# Patient Record
Sex: Male | Born: 1955 | Race: White | Hispanic: No | Smoking: Never smoker
Health system: Southern US, Community
[De-identification: ages and names within clinical notes are randomized; demographics above are authoritative.]

## PROBLEM LIST (undated history)

## (undated) DIAGNOSIS — K219 Gastro-esophageal reflux disease without esophagitis: Secondary | ICD-10-CM

## (undated) DIAGNOSIS — I10 Essential (primary) hypertension: Secondary | ICD-10-CM

## (undated) DIAGNOSIS — R51 Headache: Secondary | ICD-10-CM

## (undated) DIAGNOSIS — I2510 Atherosclerotic heart disease of native coronary artery without angina pectoris: Secondary | ICD-10-CM

## (undated) DIAGNOSIS — J069 Acute upper respiratory infection, unspecified: Secondary | ICD-10-CM

## (undated) DIAGNOSIS — T7840XA Allergy, unspecified, initial encounter: Secondary | ICD-10-CM

## (undated) DIAGNOSIS — I4891 Unspecified atrial fibrillation: Secondary | ICD-10-CM

## (undated) DIAGNOSIS — N200 Calculus of kidney: Secondary | ICD-10-CM

## (undated) DIAGNOSIS — E785 Hyperlipidemia, unspecified: Secondary | ICD-10-CM

## (undated) HISTORY — PX: COLONOSCOPY: SHX174

## (undated) HISTORY — PX: INGUINAL HERNIA REPAIR: SUR1180

## (undated) HISTORY — DX: Gastro-esophageal reflux disease without esophagitis: K21.9

## (undated) HISTORY — DX: Headache: R51

## (undated) HISTORY — DX: Acute upper respiratory infection, unspecified: J06.9

## (undated) HISTORY — DX: Allergy, unspecified, initial encounter: T78.40XA

## (undated) HISTORY — DX: Essential (primary) hypertension: I10

## (undated) HISTORY — DX: Hyperlipidemia, unspecified: E78.5

## (undated) HISTORY — PX: TONSILLECTOMY: SUR1361

## (undated) HISTORY — PX: CORONARY ANGIOPLASTY: SHX604

---

## 1998-03-25 ENCOUNTER — Encounter: Admission: RE | Admit: 1998-03-25 | Discharge: 1998-04-03 | Payer: Self-pay | Admitting: *Deleted

## 2002-04-17 ENCOUNTER — Ambulatory Visit: Admission: RE | Admit: 2002-04-17 | Discharge: 2002-04-17 | Payer: Self-pay | Admitting: Family Medicine

## 2002-04-17 ENCOUNTER — Encounter: Payer: Self-pay | Admitting: Family Medicine

## 2003-10-10 ENCOUNTER — Observation Stay (HOSPITAL_COMMUNITY): Admission: RE | Admit: 2003-10-10 | Discharge: 2003-10-11 | Payer: Self-pay | Admitting: General Surgery

## 2004-05-29 ENCOUNTER — Ambulatory Visit: Payer: Self-pay | Admitting: Internal Medicine

## 2004-06-01 ENCOUNTER — Ambulatory Visit (HOSPITAL_COMMUNITY): Admission: RE | Admit: 2004-06-01 | Discharge: 2004-06-01 | Payer: Self-pay | Admitting: Internal Medicine

## 2004-06-09 ENCOUNTER — Ambulatory Visit: Payer: Self-pay

## 2004-06-30 ENCOUNTER — Ambulatory Visit: Payer: Self-pay | Admitting: Internal Medicine

## 2004-07-07 ENCOUNTER — Ambulatory Visit: Payer: Self-pay | Admitting: Cardiovascular Disease

## 2006-03-25 ENCOUNTER — Ambulatory Visit: Payer: Self-pay | Admitting: Internal Medicine

## 2006-12-06 ENCOUNTER — Ambulatory Visit: Payer: Self-pay | Admitting: Internal Medicine

## 2008-09-24 ENCOUNTER — Ambulatory Visit: Payer: Self-pay | Admitting: Internal Medicine

## 2009-01-28 ENCOUNTER — Encounter: Payer: Self-pay | Admitting: Internal Medicine

## 2009-06-18 ENCOUNTER — Ambulatory Visit: Payer: Self-pay | Admitting: Internal Medicine

## 2009-06-18 DIAGNOSIS — M542 Cervicalgia: Secondary | ICD-10-CM | POA: Insufficient documentation

## 2009-06-18 DIAGNOSIS — M545 Low back pain, unspecified: Secondary | ICD-10-CM | POA: Insufficient documentation

## 2009-06-18 DIAGNOSIS — K219 Gastro-esophageal reflux disease without esophagitis: Secondary | ICD-10-CM | POA: Insufficient documentation

## 2009-06-18 DIAGNOSIS — R51 Headache: Secondary | ICD-10-CM

## 2009-06-18 DIAGNOSIS — G44209 Tension-type headache, unspecified, not intractable: Secondary | ICD-10-CM | POA: Insufficient documentation

## 2009-06-24 ENCOUNTER — Ambulatory Visit: Payer: Self-pay | Admitting: Cardiology

## 2009-07-07 ENCOUNTER — Telehealth: Payer: Self-pay | Admitting: Internal Medicine

## 2009-07-14 ENCOUNTER — Ambulatory Visit: Payer: Self-pay | Admitting: Internal Medicine

## 2009-08-25 ENCOUNTER — Ambulatory Visit: Payer: Self-pay | Admitting: Internal Medicine

## 2009-10-03 ENCOUNTER — Ambulatory Visit: Payer: Self-pay | Admitting: Internal Medicine

## 2009-10-03 DIAGNOSIS — N342 Other urethritis: Secondary | ICD-10-CM | POA: Insufficient documentation

## 2009-10-03 LAB — CONVERTED CEMR LAB
Bilirubin Urine: NEGATIVE
Casts: NONE SEEN /lpf
Chlamydia, DNA Probe: NEGATIVE
Crystals: NONE SEEN
GC Probe Amp, Genital: NEGATIVE
HCV Ab: NEGATIVE
Hemoglobin, Urine: NEGATIVE
Hep B C IgM: NEGATIVE
Hep B Core Total Ab: NEGATIVE
Hep B S Ab: NEGATIVE
Hepatitis B Surface Ag: NEGATIVE
Ketones, ur: NEGATIVE mg/dL
Nitrite: NEGATIVE
Protein, ur: NEGATIVE mg/dL
Specific Gravity, Urine: 1.024 (ref 1.005–1.030)
Urine Glucose: NEGATIVE mg/dL
Urobilinogen, UA: 0.2 (ref 0.0–1.0)
pH: 5.5 (ref 5.0–8.0)

## 2010-05-21 ENCOUNTER — Telehealth (INDEPENDENT_AMBULATORY_CARE_PROVIDER_SITE_OTHER): Payer: Self-pay | Admitting: *Deleted

## 2010-05-22 ENCOUNTER — Ambulatory Visit: Payer: Self-pay | Admitting: Internal Medicine

## 2010-05-26 LAB — CONVERTED CEMR LAB
ALT: 33 units/L (ref 0–53)
AST: 26 units/L (ref 0–37)
Albumin: 4 g/dL (ref 3.5–5.2)
Alkaline Phosphatase: 61 units/L (ref 39–117)
BUN: 15 mg/dL (ref 6–23)
Basophils Absolute: 0 10*3/uL (ref 0.0–0.1)
Basophils Relative: 0.7 % (ref 0.0–3.0)
Bilirubin Urine: NEGATIVE
Bilirubin, Direct: 0.1 mg/dL (ref 0.0–0.3)
CO2: 29 meq/L (ref 19–32)
Calcium: 9.1 mg/dL (ref 8.4–10.5)
Chloride: 103 meq/L (ref 96–112)
Cholesterol: 243 mg/dL — ABNORMAL HIGH (ref 0–200)
Creatinine, Ser: 1 mg/dL (ref 0.4–1.5)
Direct LDL: 149.1 mg/dL
Eosinophils Absolute: 0.2 10*3/uL (ref 0.0–0.7)
Eosinophils Relative: 2.5 % (ref 0.0–5.0)
GFR calc non Af Amer: 87.27 mL/min (ref >60)
Glucose, Bld: 91 mg/dL (ref 70–99)
HCT: 46.1 % (ref 39.0–52.0)
HDL: 55.5 mg/dL (ref >39.00)
Hemoglobin, Urine: NEGATIVE
Hemoglobin: 16.3 g/dL (ref 13.0–17.0)
Ketones, ur: NEGATIVE mg/dL
Leukocytes, UA: NEGATIVE
Lymphocytes Relative: 26.8 % (ref 12.0–46.0)
Lymphs Abs: 1.6 10*3/uL (ref 0.7–4.0)
MCHC: 35.3 g/dL (ref 30.0–36.0)
MCV: 91.6 fL (ref 78.0–100.0)
Monocytes Absolute: 0.7 10*3/uL (ref 0.1–1.0)
Monocytes Relative: 11.2 % (ref 3.0–12.0)
Neutro Abs: 3.6 10*3/uL (ref 1.4–7.7)
Neutrophils Relative %: 58.8 % (ref 43.0–77.0)
Nitrite: NEGATIVE
PSA: 0.49 ng/mL (ref 0.10–4.00)
Platelets: 224 10*3/uL (ref 150.0–400.0)
Potassium: 4.6 meq/L (ref 3.5–5.1)
RBC: 5.03 M/uL (ref 4.22–5.81)
RDW: 13.1 % (ref 11.5–14.6)
Sodium: 138 meq/L (ref 135–145)
Specific Gravity, Urine: 1.01 (ref 1.000–1.030)
TSH: 1.28 microintl units/mL (ref 0.35–5.50)
Total Bilirubin: 1.1 mg/dL (ref 0.3–1.2)
Total CHOL/HDL Ratio: 4
Total Protein, Urine: NEGATIVE mg/dL
Total Protein: 6.5 g/dL (ref 6.0–8.3)
Triglycerides: 117 mg/dL (ref 0.0–149.0)
Urine Glucose: NEGATIVE mg/dL
Urobilinogen, UA: 0.2 (ref 0.0–1.0)
VLDL: 23.4 mg/dL (ref 0.0–40.0)
WBC: 6.1 10*3/uL (ref 4.5–10.5)
pH: 7 (ref 5.0–8.0)

## 2010-05-29 ENCOUNTER — Ambulatory Visit: Payer: Self-pay | Admitting: Internal Medicine

## 2010-05-29 ENCOUNTER — Encounter: Payer: Self-pay | Admitting: Internal Medicine

## 2010-06-01 DIAGNOSIS — E785 Hyperlipidemia, unspecified: Secondary | ICD-10-CM | POA: Insufficient documentation

## 2010-06-18 ENCOUNTER — Telehealth: Payer: Self-pay | Admitting: Internal Medicine

## 2010-07-21 NOTE — Letter (Signed)
Summary: Doctor[facility]   Imported By: [place] 10/08/2009 12:14:32  _____________________________________________________________________  External Attachment:    Type:   Image     Comment:   External Document

## 2010-07-21 NOTE — Assessment & Plan Note (Signed)
Summary: NEEDS MEDICATION, WILL NOT DISCUSS WHAT KIND/CD   Vital Signs:  Patient profile:   55 year old male Height:      69 inches Weight:      188.50 pounds BMI:     27.94 O2 Sat:      95 % on Room air Temp:     97.9 degrees F oral Pulse rate:   68 / minute BP sitting:   130 / 80  (left arm) Cuff size:   large  Vitals Entered By: Lucious Groves (October 03, 2009 11:33 AM)  O2 Flow:  Room air CC: Needs Medication: Penlac./kb Is Patient Diabetic? No Pain Assessment Patient in pain? no        CC:  Needs Medication: Penlac./kb.  History of Present Illness: C/o itching x 4 d, some  penile d/c following a month of dating and unprotected sex  Current Medications (verified): 1)  Vitamin D3 1000 Unit  Tabs (Cholecalciferol) .Marland Kitchen.. 1 By Mouth Daily 2)  Ibuprofen 600 Mg Tabs (Ibuprofen) .Marland Kitchen.. 1 By Mouth Bid  Pc X 1 Wk Then As Needed For  Pain 3)  Fioricet 50-325-40 Mg Tabs (Butalbital-Apap-Caffeine) .Marland Kitchen.. 1-2 By Mouth Two Times A Day As Needed Migraine or Tension Ha 4)  Ranitidine Hcl 300 Mg Tabs (Ranitidine Hcl) .Marland Kitchen.. 1 By Mouth Once Daily For Indigestion 5)  Penlac 8 % Soln (Ciclopirox) .... Use Once Daily On Affected Nail(S). Once A Week Remove The Build-Up With An Alcohol Swab 6)  Align  Caps (Probiotic Product) .Marland Kitchen.. 1 By Mouth Once Daily For Your Intesinal Flora Restoraion  Allergies (verified): No Known Drug Allergies  Past History:  Past Medical History: GERD Urethritis 2011  Social History: Reviewed history from 06/18/2009 and no changes required. Divorced Has 1 son  Review of Systems  The patient denies fever, abdominal pain, melena, severe indigestion/heartburn, and enlarged lymph nodes.         No rash  Physical Exam  General:  Well-developed,well-nourished,in no acute distress; alert,appropriate and cooperative throughout examination Mouth:  Oral mucosa and oropharynx without lesions or exudates.  Teeth in good repair. Neck:  Cervical spine is tender to  palpation over paraspinal muscles and with the ROM  Lungs:  Normal respiratory effort, chest expands symmetrically. Lungs are clear to auscultation, no crackles or wheezes. Heart:  Normal rate and regular rhythm. S1 and S2 normal without gallop, murmur, click, rub or other extra sounds. Abdomen:  Bowel sounds positive,abdomen soft and non-tender without masses, organomegaly or hernias noted. Genitalia:  Testes bilaterally descended without nodularity, tenderness or masses. No scrotal masses or lesions. No penis lesions. There is some urethral discharge. Msk:  Lumbar-sacral spine is tender to palpation over paraspinal muscles and painfull with the ROM  Skin:  Intact without suspicious lesions or rashes Psych:  Cognition and judgment appear intact. Alert and cooperative with normal attention span and concentration. No apparent delusions, illusions, hallucinations   Impression & Recommendations:  Problem # 1:  URETHRITIS, ACUTE (ICD-597.80) Assessment New Safe sex discussed The office visit took longer than 25 min with patient councelling for more than 50% of the 20 min   Orders: T-Chlamydia & GC Probe, Genital ([id number]) T-RPR (Syphilis) [phone]) T-Urinalysis [phone]) T-Hepatitis C Antibody ([id number]) T-Hepatitis B Core Antibody, IGM ([id number]) T-Hepatitis B Core Antibody (38101-75102) T-Hepatitis B Surface Antigen (58527-78242) T-Hepatitis B Surface Antibody (35361-44315) T-HIV Antibody  (Reflex) ([id number])  Complete Medication List: 1)  Vitamin D3 1000 Unit Tabs (Cholecalciferol) .Marland Kitchen.. 1 by mouth daily 2)  Ibuprofen  600 Mg Tabs (Ibuprofen) .Marland Kitchen.. 1 by mouth bid  pc x 1 wk then as needed for  pain 3)  Fioricet 50-325-40 Mg Tabs (Butalbital-apap-caffeine) .Marland Kitchen.. 1-2 by mouth two times a day as needed migraine or tension ha 4)  Ranitidine Hcl 300 Mg Tabs (Ranitidine hcl) .Marland Kitchen.. 1 by mouth once daily for indigestion 5)  Penlac 8 % Soln (Ciclopirox) .... Use once daily  on affected nail(s). once a week remove the build-up with an alcohol swab 6)  Align Caps (Probiotic product) .Marland Kitchen.. 1 by mouth once daily for your intesinal flora restoraion 7)  Doxycycline Hyclate 100 Mg Caps (Doxycycline hyclate) .Marland Kitchen.. 1 by mouth two times a day with a glass of water 8)  Ciprofloxacin Hcl 500 Mg Tabs (Ciprofloxacin hcl) .Marland Kitchen.. 1 by mouth bid  Patient Instructions: 1)  Call if you are not better in a reasonable amount of time or if worse.  Prescriptions: CIPROFLOXACIN HCL 500 MG TABS (CIPROFLOXACIN HCL) 1 by mouth bid  #10 x 0   Entered and Authorized by:   Tresa Garter MD   Signed by:   Tresa Garter MD on 10/03/2009   Method used:   Print then Give to Patient   RxID:   [id number] DOXYCYCLINE HYCLATE 100 MG CAPS (DOXYCYCLINE HYCLATE) 1 by mouth two times a day with a glass of water  #20 x 0   Entered and Authorized by:   Tresa Garter MD   Signed by:   Tresa Garter MD on 10/03/2009   Method used:   Print then Give to Patient   RxID:   [id number]

## 2010-07-21 NOTE — Assessment & Plan Note (Signed)
Summary: 6 WK ROV /NWS  #   Vital Signs:  Patient profile:   55 year old male Weight:      187 pounds Temp:     97.8 degrees F oral Pulse rate:   54 / minute BP sitting:   120 / 80  (left arm)  Vitals Entered By: Tora Perches (August 25, 2009 1:45 PM) CC: f/u Is Patient Diabetic? No   CC:  f/u.  History of Present Illness: F/u LBP, neck pain, HAs - overall better.  Preventive Screening-Counseling & Management  Alcohol-Tobacco     Smoking Status: never  Current Medications (verified): 1)  Vitamin D3 1000 Unit  Tabs (Cholecalciferol) .Marland Kitchen.. 1 By Mouth Daily 2)  Ibuprofen 600 Mg Tabs (Ibuprofen) .Marland Kitchen.. 1 By Mouth Bid  Pc X 1 Wk Then As Needed For  Pain 3)  Fioricet 50-325-40 Mg Tabs (Butalbital-Apap-Caffeine) .Marland Kitchen.. 1-2 By Mouth Two Times A Day As Needed Migraine or Tension Ha 4)  Ranitidine Hcl 300 Mg Tabs (Ranitidine Hcl) .Marland Kitchen.. 1 By Mouth Once Daily For Indigestion 5)  Penlac 8 % Soln (Ciclopirox) .... Use Once Daily On Affected Nail(S). Once A Week Remove The Build-Up With An Alcohol Swab 6)  Align  Caps (Probiotic Product) .Marland Kitchen.. 1 By Mouth Once Daily For Your Intesinal Flora Restoraion  Allergies (verified): No Known Drug Allergies  Physical Exam  General:  Well-developed,well-nourished,in no acute distress; alert,appropriate and cooperative throughout examination Mouth:  Oral mucosa and oropharynx without lesions or exudates.  Teeth in good repair. Neck:  Cervical spine is tender to palpation over paraspinal muscles and with the ROM  Abdomen:  Bowel sounds positive,abdomen soft and non-tender without masses, organomegaly or hernias noted. Msk:  Lumbar-sacral spine is tender to palpation over paraspinal muscles and painfull with the ROM  Extremities:  No clubbing, cyanosis, edema, or deformity noted with normal full range of motion of all joints.   Skin:  Intact without suspicious lesions or rashes Psych:  Cognition and judgment appear intact. Alert and cooperative with normal  attention span and concentration. No apparent delusions, illusions, hallucinations   Contraindications/Deferment of Procedures/Staging:    Test/Procedure: FLU VAX    Reason for deferment: patient declined     Test/Procedure: Pneumovax vaccine    Reason for deferment: patient declined   Impression & Recommendations:  Problem # 1:  LOW BACK PAIN, ACUTE (ICD-724.2) Assessment Improved  His updated medication list for this problem includes:    Ibuprofen 600 Mg Tabs (Ibuprofen) .Marland Kitchen... 1 by mouth bid  pc x 1 wk then as needed for  pain    Fioricet 50-325-40 Mg Tabs (Butalbital-apap-caffeine) .Marland Kitchen... 1-2 by mouth two times a day as needed migraine or tension ha  Problem # 2:  HEADACHE (ICD-784.0) Assessment: Improved  His updated medication list for this problem includes:    Ibuprofen 600 Mg Tabs (Ibuprofen) .Marland Kitchen... 1 by mouth bid  pc x 1 wk then as needed for  pain    Fioricet 50-325-40 Mg Tabs (Butalbital-apap-caffeine) .Marland Kitchen... 1-2 by mouth two times a day as needed migraine or tension ha  Problem # 3:  NECK PAIN (ICD-723.1) Assessment: Improved  His updated medication list for this problem includes:    Ibuprofen 600 Mg Tabs (Ibuprofen) .Marland Kitchen... 1 by mouth bid  pc x 1 wk then as needed for  pain    Fioricet 50-325-40 Mg Tabs (Butalbital-apap-caffeine) .Marland Kitchen... 1-2 by mouth two times a day as needed migraine or tension ha  Complete Medication List: 1)  Vitamin D3 1000 Unit Tabs (Cholecalciferol) .Marland Kitchen.. 1 by mouth daily 2)  Ibuprofen 600 Mg Tabs (Ibuprofen) .Marland Kitchen.. 1 by mouth bid  pc x 1 wk then as needed for  pain 3)  Fioricet 50-325-40 Mg Tabs (Butalbital-apap-caffeine) .Marland Kitchen.. 1-2 by mouth two times a day as needed migraine or tension ha 4)  Ranitidine Hcl 300 Mg Tabs (Ranitidine hcl) .Marland Kitchen.. 1 by mouth once daily for indigestion 5)  Penlac 8 % Soln (Ciclopirox) .... Use once daily on affected nail(s). once a week remove the build-up with an alcohol swab 6)  Align Caps (Probiotic product) .Marland Kitchen.. 1 by mouth  once daily for your intesinal flora restoraion 7)  Tamiflu 75 Mg Caps (Oseltamivir phosphate) .Marland Kitchen.. 1 by mouth two times a day for a flu  Other Orders: Tdap => 32yrs IM (27253) Admin 1st Vaccine (66440) Admin 1st Vaccine [facility]) [phone])  Patient Instructions: 1)  Please schedule a follow-up appointment in 3 months. Prescriptions: TAMIFLU 75 MG CAPS (OSELTAMIVIR PHOSPHATE) 1 by mouth two times a day for a flu  #10 x 0   Entered and Authorized by:   Tresa Garter MD   Signed by:   Tresa Garter MD on 08/25/2009   Method used:   Print then Give to Patient   RxID:   [id number]    Tetanus/Td Vaccine    Vaccine Type: Tdap    Site: right deltoid    Mfr: GlaxoSmithKline    Dose: 0.5 ml    Route: IM    Given by: Tora Perches    Exp. Date: 09/13/2011    Lot #: [id number]    VIS given: 05/09/07 version given August 25, 2009.

## 2010-07-21 NOTE — Progress Notes (Signed)
Summary: Letter Request  Phone Note Call from Patient Call back at Home Phone [phone]   Caller: Patient Call For: Jon Garter MD Summary of Call: Pt came into the office requesting for Dr. Posey Rea write a letter concerning  why he need a colonoscopy done, Pt stated that colonoscopy is very costly and he needs the letter to send to his insurance company.  Cell : [phone] Initial call taken by: Livingston Diones,  July 07, 2009 3:09 PM  Follow-up for Phone Call        Colonosc will be scheduled by GI (see orders) - they will put it thru Insurance Follow-up by: Jon Garter MD,  July 08, 2009 7:35 AM

## 2010-07-21 NOTE — Progress Notes (Signed)
  Phone Note Call from Patient   Summary of Call: Mom called .Marland Kitchen He needs labs and OV Initial call taken by: Tresa Garter MD,  May 21, 2010 12:07 PM  Follow-up for Phone Call        Will order CBC, TSH, BMET, Hepatic panel, UA, Lipids, PSA prior Dx: V70.0 Pls call pt or his mom to sch well exam Thank you!   Follow-up by: Tresa Garter MD,  May 21, 2010 12:08 PM  Additional Follow-up for Phone Call Additional follow up Details #1::        Pt called order put in IDX for labs today and CPX sched for 12/9 @3pm . Additional Follow-up by: Verdell Face,  May 22, 2010 8:38 AM
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
## 2010-07-23 NOTE — Progress Notes (Signed)
Summary: MESSAGE TO MD  Phone Note Call from Patient   Summary of Call: Pt's mother left vm in [place], I fwd mess to your VM.  Initial call taken by: Lamar Sprinkles, CMA,  June 18, 2010 9:47 AM  Follow-up for Phone Call        Mom called - abd cramps w/Lipitor Follow-up by: Tresa Garter MD,  June 18, 2010 12:54 PM  Additional Follow-up for Phone Call Additional follow up Details #1::        Stop Lipitor Additional Follow-up by: Tresa Garter MD,  June 18, 2010 12:54 PM   New Allergies: LIPITOR Additional Follow-up for Phone Call Additional follow up Details #2::    left mess to call office back............Marland KitchenLamar Sprinkles, CMA  June 18, 2010 5:24 PM   Additional Follow-up for Phone Call Additional follow up Details #3:: Details for Additional Follow-up Action Taken: pt informed of MD's advisement Additional Follow-up by: Brenton Grills CMA Duncan Dull),  June 19, 2010 8:29 AM  New Allergies: LIPITOR

## 2010-07-23 NOTE — Assessment & Plan Note (Signed)
Summary: CPX/bcbs/#/cd   Vital Signs:  Patient profile:   55 year old male Height:      69 inches Weight:      189 pounds BMI:     28.01 Temp:     98.5 degrees F oral Pulse rate:   80 / minute Pulse rhythm:   regular Resp:     16 per minute BP sitting:   120 / 90  (left arm) Cuff size:   large  Vitals Entered By: Lanier Prude, Beverly Gust) (May 29, 2010 11:52 AM) CC: CPX Is Patient Diabetic? No Comments pt is not taking vit d, ibuprofen, fioricet, ranititdine or align   CC:  CPX.  History of Present Illness: The patient presents for a preventive health examination   Preventive Screening-Counseling & Management  Alcohol-Tobacco     Smoking Status: never  Caffeine-Diet-Exercise     Does Patient Exercise: yes  Current Medications (verified): 1)  Vitamin D3 1000 Unit  Tabs (Cholecalciferol) .Marland Kitchen.. 1 By Mouth Daily 2)  Ibuprofen 600 Mg Tabs (Ibuprofen) .Marland Kitchen.. 1 By Mouth Bid  Pc X 1 Wk Then As Needed For  Pain 3)  Fioricet 50-325-40 Mg Tabs (Butalbital-Apap-Caffeine) .Marland Kitchen.. 1-2 By Mouth Two Times A Day As Needed Migraine or Tension Ha 4)  Ranitidine Hcl 300 Mg Tabs (Ranitidine Hcl) .Marland Kitchen.. 1 By Mouth Once Daily For Indigestion 5)  Align  Caps (Probiotic Product) .Marland Kitchen.. 1 By Mouth Once Daily For Your Intesinal Flora Restoraion 6)  Aspir-Low 81 Mg Tbec (Aspirin) .Marland Kitchen.. 1 By Mouth Once Daily 7)  Fish Oil 1000 Mg Caps (Omega-3 Fatty Acids) .Marland Kitchen.. 1 By Mouth Once Daily  Allergies (verified): No Known Drug Allergies  Past History:  Past Surgical History: Last updated: 07/14/2009 Denies surgical history  Family History: Last updated: 05/29/2010 Family History of Anxiety F CAD, AAA Family History of CAD Male 1st degree relative <60 Family History Hypertension  Past Medical History: GERD Urethritis 2011 Hyperlipidemia  Family History: Family History of Anxiety F CAD, AAA Family History of CAD Male 1st degree relative <60 Family History Hypertension  Social  History: Divorced Has 1 son Never Smoked Alcohol use-yes Regular exercise-yes Occupation: Does Patient Exercise:  yes  Review of Systems       The patient complains of abdominal pain.  The patient denies anorexia, fever, weight loss, weight gain, vision loss, decreased hearing, hoarseness, chest pain, syncope, dyspnea on exertion, peripheral edema, prolonged cough, headaches, hemoptysis, melena, hematochezia, severe indigestion/heartburn, hematuria, incontinence, genital sores, muscle weakness, suspicious skin lesions, transient blindness, difficulty walking, depression, unusual weight change, abnormal bleeding, enlarged lymph nodes, angioedema, and testicular masses.    Physical Exam  General:  Well-developed,well-nourished,in no acute distress; alert,appropriate and cooperative throughout examination Head:  Normocephalic and atraumatic without obvious abnormalities. No apparent alopecia or balding. Eyes:  No corneal or conjunctival inflammation noted. EOMI. Perrla Ears:  External ear exam shows no significant lesions or deformities.  Otoscopic examination reveals clear canals, tympanic membranes are intact bilaterally without bulging, retraction, inflammation or discharge. Hearing is grossly normal bilaterally. Nose:  Erythematous throat mucosa and intranasal erythema.  Neck:  No deformities, masses, or tenderness noted. Lungs:  Normal respiratory effort, chest expands symmetrically. Lungs are clear to auscultation, no crackles or wheezes. Heart:  Normal rate and regular rhythm. S1 and S2 normal without gallop, murmur, click, rub or other extra sounds. Abdomen:  Bowel sounds positive,abdomen soft and non-tender without masses, organomegaly or hernias noted. Genitalia:  Self exam is nl Prostate:  per GI  Msk:  Lumbar-sacral spine is tender to palpation over paraspinal muscles and painfull with the ROM  Pulses:  R and L carotid,radial,femoral,dorsalis pedis and posterior tibial pulses are  full and equal bilaterally Extremities:  No clubbing, cyanosis, edema, or deformity noted with normal full range of motion of all joints.   Neurologic:  No cranial nerve deficits noted. Station and gait are normal. Plantar reflexes are down-going bilaterally. DTRs are symmetrical throughout. Sensory, motor and coordinative functions appear intact. Skin:  Intact without suspicious lesions or rashes Cervical Nodes:  No lymphadenopathy noted Inguinal Nodes:  No significant adenopathy Psych:  Cognition and judgment appear intact. Alert and cooperative with normal attention span and concentration. No apparent delusions, illusions, hallucinations   Impression & Recommendations:  Problem # 1:  Preventive Health Care (ICD-V70.0) Assessment New The labs were reviewed with the patient.  Health and age related issues were discussed. Available screening tests and vaccinations were discussed as well. Healthy life style including good diet and exercise was discussed.  Colonosc. adviced  Problem # 2:  HYPERLIPIDEMIA (ICD-272.4) Assessment: Deteriorated  His updated medication list for this problem includes:    Lipitor 20 Mg Tabs (Atorvastatin calcium) .Marland Kitchen... 1 by mouth once daily for cholesterol  Complete Medication List: 1)  Vitamin D3 1000 Unit Tabs (Cholecalciferol) .Marland Kitchen.. 1 by mouth daily 2)  Ranitidine Hcl 300 Mg Tabs (Ranitidine hcl) .Marland Kitchen.. 1 by mouth once daily for indigestion 3)  Aspir-low 81 Mg Tbec (Aspirin) .Marland Kitchen.. 1 by mouth once daily 4)  Fish Oil 1000 Mg Caps (Omega-3 fatty acids) .Marland Kitchen.. 1 by mouth once daily 5)  Lipitor 20 Mg Tabs (Atorvastatin calcium) .Marland Kitchen.. 1 by mouth once daily for cholesterol  Other Orders: Gastroenterology Referral (GI)  Patient Instructions: 1)  Labs in 2 months  2)  Hepatic Panel prior to visit, ICD-9:272.0 3)  Lipid Panel prior to visit, ICD-9: Prescriptions: LIPITOR 20 MG TABS (ATORVASTATIN CALCIUM) 1 by mouth once daily for cholesterol  #30 x 12   Entered and  Authorized by:   Tresa Garter MD   Signed by:   Tresa Garter MD on 05/29/2010   Method used:   Print then Give to Patient   RxID:   [id number]    Orders Added: 1)  Est. Patient 40-64 years [14782] 2)  Gastroenterology Referral [GI]    Influenza Vaccine (to be given today)

## 2010-08-12 ENCOUNTER — Other Ambulatory Visit: Payer: Self-pay

## 2010-11-06 NOTE — Op Note (Signed)
[name], [name] NO.:  [id number]   MEDICAL RECORD NO.:  [id number]                   PATIENT TYPE:  AMB   LOCATION:  DAY                                  FACILITY:  [facility]   PHYSICIAN:  [name], M.D.            DATE OF BIRTH:  May 13, 1956   DATE OF PROCEDURE:  10/10/2003  DATE OF DISCHARGE:                                 OPERATIVE REPORT   PREOPERATIVE DIAGNOSES:  Bilateral inguinal hernias.   POSTOPERATIVE DIAGNOSES:  Bilateral inguinal hernias.   PROCEDURE:  Laparoscopic bilateral inguinal hernia repair with mesh.   SURGEON:  [name], M.D.   ANESTHESIA:  General.   INDICATIONS FOR PROCEDURE:  This is a 55 year old male who has been  diagnosed with bilateral inguinal hernias. Repair has been suggested to him  by way of the laparoscopic technique. The procedure and the risks were  discussed with him preoperatively.  He has elected to proceed.   TECHNIQUE:  He is seen in the holding area and then brought to the operating  room, placed supine on the operating table and a general anesthetic was  administered.  A Foley catheter was placed in his bladder. The clipper is  used to remove the hair from the lower abdomen and groin and this area was  then sterilely prepped and draped.  Dilute Marcaine solution was infiltrated  in the subumbilical region and transverse subumbilical incision was made  through the skin and subcutaneous tissue.  Using blunt dissection, the  anterior rectus sheath on the right side was identified and a small  longitudinal incision made in it.  The underlying right rectus muscle was  swept laterally exposing the posterior rectus sheath. A balloon dissection  device was then placed into the extraperitoneal plane and under laparoscopic  vision, ballon dissection was performed.   The balloon dissection device was then removed and a trocar was placed in  the extraperitoneal space and CO2 gas at a  pressure of 11 was insufflated.  The laparoscope was introduced and under direct vision, two 5 mm trocars  were placed through lower midline incisions. The symphysis pubis was then  identified using blunt dissection, both of the right and left Cooper's  ligaments were identified. A direct hernia was noted on the right side and  extraperitoneal contents were reduced leaving the sack behind.  Using blunt  dissection, the fibrofatty tissue from the anterior and lateral abdominal  walls was dissected away from the muscular wall to the level of the  umbilicus.  The right spermatic cord was then isolated and no indirect  hernia was noted.   Next, the left side was approached. The direct space was then identified and  no obvious defect was seen. The fibrofatty tissue on the anterior lateral  abdominal wall was taken down bluntly to the level of the umbilicus. The  spermatic cord was identified and indirect hernia was noted here with a  large indirect  sac which was able to be stripped off the spermatic cord and  reduced back to the level of the umbilicus. The cord was then isolated.   A piece of 5 x 6 inch polypropylene mesh with a partial longitudinal slit  cut into it was then placed in the right extraperitoneal space and  positioned appropriately.  It was anchored to Cooper's ligament, the  anterior lateral abdominal wall with spiral tacks.  The two tails were  wrapped around the cord creating a new internal ring. This provided for  adequate coverage of the direct, indirect and femoral spaces.   In a similar fashion, a 5 x 6 inch piece of polypropylene mesh with a  partial longitudinal slit was placed in the left extraperitoneal space and  positioned appropriately.  It was anchored to Cooper's ligament and the  anterior and lateral abdominal wall with spiral tacks. The two tails of the  mesh were wrapped around the cord creating a new internal ring.   The inferolateral aspect of the pieces  of mesh were then held down and the  CO2 gas was released.  Once this was done, the instruments were removed and  the trocar was removed. The right anterior rectus fascial defect was closed  with interrupted #0 Vicryl sutures. The skin incisions were closed with 4-0  Monocryl subcuticular stitches.  Steri-Strips and sterile dressings were  applied.   He tolerated the procedure well without any apparent complications and was  taken to the recovery room in satisfactory condition.                                               [name], M.D.    [name]  D:  10/10/2003  T:  10/10/2003  Job:  [id number]   cc:   [facility] [address]

## 2011-06-02 ENCOUNTER — Other Ambulatory Visit: Payer: Self-pay | Admitting: *Deleted

## 2011-06-02 DIAGNOSIS — Z0000 Encounter for general adult medical examination without abnormal findings: Secondary | ICD-10-CM

## 2011-06-02 DIAGNOSIS — E785 Hyperlipidemia, unspecified: Secondary | ICD-10-CM

## 2011-06-03 ENCOUNTER — Other Ambulatory Visit (INDEPENDENT_AMBULATORY_CARE_PROVIDER_SITE_OTHER): Payer: Self-pay

## 2011-06-03 ENCOUNTER — Other Ambulatory Visit: Payer: Self-pay | Admitting: Internal Medicine

## 2011-06-03 ENCOUNTER — Telehealth: Payer: Self-pay | Admitting: Internal Medicine

## 2011-06-03 DIAGNOSIS — E785 Hyperlipidemia, unspecified: Secondary | ICD-10-CM

## 2011-06-03 LAB — LIPID PANEL
Total CHOL/HDL Ratio: 4
Triglycerides: 99 mg/dL (ref 0.0–149.0)

## 2011-06-03 LAB — LDL CHOLESTEROL, DIRECT: Direct LDL: 140.9 mg/dL

## 2011-06-03 NOTE — Telephone Encounter (Signed)
Please, mail the labs to the patient.     Thx 

## 2011-06-10 NOTE — Telephone Encounter (Signed)
Labs mailed

## 2011-08-24 ENCOUNTER — Telehealth: Payer: Self-pay

## 2011-08-24 NOTE — Telephone Encounter (Signed)
Patient called [facility] requesting a personal call back from his nurse regarding an appt.

## 2011-08-25 ENCOUNTER — Encounter: Payer: Self-pay | Admitting: Gastroenterology

## 2011-08-25 ENCOUNTER — Ambulatory Visit (INDEPENDENT_AMBULATORY_CARE_PROVIDER_SITE_OTHER): Payer: BC Managed Care – PPO | Admitting: Gastroenterology

## 2011-08-25 VITALS — BP 132/76 | HR 60 | Ht 69.0 in | Wt 191.0 lb

## 2011-08-25 DIAGNOSIS — K921 Melena: Secondary | ICD-10-CM

## 2011-08-25 DIAGNOSIS — R195 Other fecal abnormalities: Secondary | ICD-10-CM

## 2011-08-25 MED ORDER — PEG-KCL-NACL-NASULF-NA ASC-C 100 G PO SOLR: 1.0000 | Freq: Once | ORAL | Status: DC

## 2011-08-25 MED ORDER — HYDROCORTISONE ACETATE 25 MG RE SUPP: 25.0000 mg | Freq: Every day | RECTAL | Status: DC

## 2011-08-25 NOTE — Patient Instructions (Signed)
You have been scheduled for a colonoscopy with propofol. Please follow written instructions given to you at your visit today.  Please pick up your prep kit at the pharmacy within the next 1-3 days. We have sent the following medications to your pharmacy for you to pick up at your convenience: Hydrocortisone supppository. cc: Sonda Primes, MD

## 2011-08-25 NOTE — Progress Notes (Signed)
History of Present Illness: This is a 56 year old male noted the onset of bright red rectal bleeding for the past 4 days. He states he had some upper respiratory symptoms and took a [place] antibiotic and Alka-Seltzer for 2-3 days prior to the onset of the rectal bleeding. He discontinued both medications. He has normal bowel movements without pain and then notes a small amount bright red blood in the commode following the bowel movement. Denies weight loss, abdominal pain, constipation, diarrhea, change in stool caliber, melena, nausea, vomiting, dysphagia, reflux symptoms, chest pain.  Review of Systems: Pertinent positive and negative review of systems were noted in the above HPI section. All other review of systems were otherwise negative.  Current Medications, Allergies, Past Medical History, Past Surgical History, Family History and Social History were reviewed in [employer] record.  Physical Exam: General: Well developed , well nourished, no acute distress Head: Normocephalic and atraumatic Eyes:  sclerae anicteric, EOMI Ears: Normal auditory acuity Mouth: No deformity or lesions Neck: Supple, no masses or thyromegaly Lungs: Clear throughout to auscultation Heart: Regular rate and rhythm; no murmurs, rubs or bruits Abdomen: Soft, non tender and non distended. No masses, hepatosplenomegaly or hernias noted. Normal Bowel sounds Rectal: No lesions, no tenderness 3+ Hemoccult positive soft stool in the vault Musculoskeletal: Symmetrical with no gross deformities  Skin: No lesions on visible extremities Pulses:  Normal pulses noted Extremities: No clubbing, cyanosis, edema or deformities noted Neurological: Alert oriented x 4, grossly nonfocal Cervical Nodes:  No significant cervical adenopathy Inguinal Nodes: No significant inguinal adenopathy Psychological:  Alert and cooperative. Normal mood and affect  Assessment and Recommendations:  1. Hematochezia.  Hemoccult-positive stool. Rule out hemorrhoids, colorectal neoplasms. The risks, benefits, and alternatives to colonoscopy with possible biopsy, possible destruction of internal hemorrhoids and possible polypectomy were discussed with the patient and they consent to proceed.

## 2011-08-26 NOTE — Telephone Encounter (Signed)
Pt is scheduled to have Colonoscopy on April 2 with [facility]. He states you mentioned that he may be able to have it for free. Please advise.  Pt is aware AVP is out of town until Monday.

## 2011-08-26 NOTE — Telephone Encounter (Signed)
Called pt- spoke to [place]. She states I need to call back around 11 am.

## 2011-08-30 NOTE — Telephone Encounter (Signed)
I checked - it may only be avail for pts w/o health insurance He has [employer] Thx

## 2011-08-31 ENCOUNTER — Telehealth: Payer: Self-pay | Admitting: Gastroenterology

## 2011-08-31 NOTE — Telephone Encounter (Signed)
Pt informed by Dr. Plotnikov 

## 2011-08-31 NOTE — Telephone Encounter (Signed)
Dr Russella Dar are you doing his colonoscopy as a screening procedure?  The office visit says hematochezia and heme positive stool.  His insurance will pay 100 % if it is coded as a screening.  Please advise

## 2011-09-03 NOTE — Telephone Encounter (Signed)
Patient aware.

## 2011-09-03 NOTE — Telephone Encounter (Signed)
We can code as screening for the procedure visit however the office visit was for rectal bleeding. This might work but cannot guarantee.

## 2011-09-21 ENCOUNTER — Encounter: Payer: Self-pay | Admitting: Gastroenterology

## 2011-09-21 ENCOUNTER — Ambulatory Visit (AMBULATORY_SURGERY_CENTER): Payer: BC Managed Care – PPO | Admitting: Gastroenterology

## 2011-09-21 VITALS — BP 136/90 | HR 73 | Resp 17 | Ht 69.0 in | Wt 191.0 lb

## 2011-09-21 DIAGNOSIS — D126 Benign neoplasm of colon, unspecified: Secondary | ICD-10-CM

## 2011-09-21 DIAGNOSIS — Z1211 Encounter for screening for malignant neoplasm of colon: Secondary | ICD-10-CM

## 2011-09-21 MED ORDER — SODIUM CHLORIDE 0.9 % IV SOLN: 500.0000 mL | INTRAVENOUS | Status: DC

## 2011-09-21 NOTE — Op Note (Signed)
[facility] [address] [place], [place]  [address]  COLONOSCOPY PROCEDURE REPORT  PATIENT:  [name], [name]  MR#:  [id number] BIRTHDATE:  1955-07-04, 55 yrs. old  GENDER:  male ENDOSCOPIST:  Judie Petit T. Russella Dar, MD, [facility] Referred by:  Linda Hedges. Plotnikov, M.D. PROCEDURE DATE:  09/21/2011 PROCEDURE:  Colonoscopy with biopsy ASA CLASS:  Class II INDICATIONS:  1) Routine Risk Screening MEDICATIONS:   MAC sedation, administered by CRNA, propofol (Diprivan) 300 mg IV DESCRIPTION OF PROCEDURE:   After the risks benefits and alternatives of the procedure were thoroughly explained, informed consent was obtained.  Digital rectal exam was performed and revealed no abnormalities.   The LB 180AL [id number] endoscope was introduced through the anus and advanced to the cecum, which was identified by both the appendix and ileocecal valve, without limitations.  The quality of the prep was good, using MoviPrep. The instrument was then slowly withdrawn as the colon was fully examined. <<PROCEDUREIMAGES>> FINDINGS:  A sessile polyp was found at the hepatic flexure. It was 4 mm in size. The polyp was removed using cold biopsy forceps. Mild diverticulosis was found in the sigmoid colon.  Otherwise normal colonoscopy without other polyps, masses, vascular ectasias, or inflammatory changes. Retroflexed views in the rectum revealed internal hemorrhoids, small.  The time to cecum =  1.75 minutes. The scope was then withdrawn (time =  10.5  min) from the patient and the procedure completed.  COMPLICATIONS:  None  ENDOSCOPIC IMPRESSION: 1) 4 mm sessile polyp at the hepatic flexure 2) Mild diverticulosis in the sigmoid colon 3) Internal hemorrhoids  RECOMMENDATIONS: 1) Await pathology results 2) High fiber diet with liberal fluid intake. 3) If the polyp is adenomatous (pre-cancerous), repeat colonoscopy in 5 years. Otherwise follow colorectal cancer screening guidelines for "routine risk"  patients with colonoscopy in 10 years.  Venita Lick. Russella Dar, MD, [facility]  n. eSIGNED:   Venita Lick. Jessaca Philippi at 09/21/2011 02:23 PM  Alverda Skeans, [phone]

## 2011-09-21 NOTE — Progress Notes (Signed)
No complaints noted in the recovery room. Maw  Patient did not experience any of the following events: a burn prior to discharge; a fall within the facility; wrong site/side/patient/procedure/implant event; or a hospital transfer or hospital admission upon discharge from the facility. (G8907) Patient did not have preoperative order for IV antibiotic SSI prophylaxis. (G8918)  

## 2011-09-21 NOTE — Patient Instructions (Signed)
Handout given on polyps, Diverticulsosis, and high fiber diet with liberal fluid intake.  Resume your prior medications today.  Call if any questions or concerns.   YOU HAD AN ENDOSCOPIC PROCEDURE TODAY AT THE [facility]: Refer to the procedure report that was given to you for any specific questions about what was found during the examination.  If the procedure report does not answer your questions, please call your gastroenterologist to clarify.  If you requested that your care partner not be given the details of your procedure findings, then the procedure report has been included in a sealed envelope for you to review at your convenience later.  YOU SHOULD EXPECT: Some feelings of bloating in the abdomen. Passage of more gas than usual.  Walking can help get rid of the air that was put into your GI tract during the procedure and reduce the bloating. If you had a lower endoscopy (such as a colonoscopy or flexible sigmoidoscopy) you may notice spotting of blood in your stool or on the toilet paper. If you underwent a bowel prep for your procedure, then you may not have a normal bowel movement for a few days.  DIET: Your first meal following the procedure should be a light meal and then it is ok to progress to your normal diet.  A half-sandwich or bowl of soup is an example of a good first meal.  Heavy or fried foods are harder to digest and may make you feel nauseous or bloated.  Likewise meals heavy in dairy and vegetables can cause extra gas to form and this can also increase the bloating.  Drink plenty of fluids but you should avoid alcoholic beverages for 24 hours.  ACTIVITY: Your care partner should take you home directly after the procedure.  You should plan to take it easy, moving slowly for the rest of the day.  You can resume normal activity the day after the procedure however you should NOT DRIVE or use heavy machinery for 24 hours (because of the sedation medicines used during the  test).    SYMPTOMS TO REPORT IMMEDIATELY: A gastroenterologist can be reached at any hour.  During normal business hours, 8:30 AM to 5:00 PM Monday through Friday, call [phone].  After hours and on weekends, please call the GI answering service at [phone] who will take a message and have the physician on call contact you.   Following lower endoscopy (colonoscopy or flexible sigmoidoscopy):  Excessive amounts of blood in the stool  Significant tenderness or worsening of abdominal pains  Swelling of the abdomen that is new, acute  Fever of 100F or higher    FOLLOW UP: If any biopsies were taken you will be contacted by phone or by letter within the next 1-3 weeks.  Call your gastroenterologist if you have not heard about the biopsies in 3 weeks.  Our staff will call the home number listed on your records the next business day following your procedure to check on you and address any questions or concerns that you may have at that time regarding the information given to you following your procedure. This is a courtesy call and so if there is no answer at the home number and we have not heard from you through the emergency physician on call, we will assume that you have returned to your regular daily activities without incident.  SIGNATURES/CONFIDENTIALITY: You and/or your care partner have signed paperwork which will be entered into your electronic medical record.  These signatures attest to the fact that that the information above on your After Visit Summary has been reviewed and is understood.  Full responsibility of the confidentiality of this discharge information lies with you and/or your care-partner.  

## 2011-09-22 ENCOUNTER — Telehealth: Payer: Self-pay | Admitting: *Deleted

## 2011-09-22 NOTE — Telephone Encounter (Signed)
  Follow up Call-  Call back number 09/21/2011  Post procedure Call Back phone  # [phone]  Permission to leave phone message Yes     Patient questions:  Do you have a fever, pain , or abdominal swelling? no Pain Score  0 *  Have you tolerated food without any problems? yes  Have you been able to return to your normal activities? yes  Do you have any questions about your discharge instructions: Diet   no Medications  no Follow up visit  no  Do you have questions or concerns about your Care? no  Actions: * If pain score is 4 or above: No action needed, pain <4.

## 2011-09-27 ENCOUNTER — Encounter: Payer: Self-pay | Admitting: Gastroenterology

## 2011-10-21 ENCOUNTER — Telehealth: Payer: Self-pay | Admitting: Gastroenterology

## 2011-10-21 NOTE — Telephone Encounter (Signed)
Results reviewed

## 2012-04-18 ENCOUNTER — Telehealth: Payer: Self-pay | Admitting: Gastroenterology

## 2012-04-18 DIAGNOSIS — R195 Other fecal abnormalities: Secondary | ICD-10-CM

## 2012-04-18 DIAGNOSIS — K921 Melena: Secondary | ICD-10-CM

## 2012-04-18 MED ORDER — HYDROCORTISONE ACETATE 25 MG RE SUPP: 25.0000 mg | Freq: Every day | RECTAL | Status: DC

## 2012-04-18 NOTE — Telephone Encounter (Signed)
Patient is requesting a refill of his anusol.  He is having occasional rectal bleeding.  I have sent him a new rx as requested

## 2012-08-14 ENCOUNTER — Telehealth: Payer: Self-pay | Admitting: Gastroenterology

## 2012-08-14 DIAGNOSIS — R195 Other fecal abnormalities: Secondary | ICD-10-CM

## 2012-08-14 DIAGNOSIS — K921 Melena: Secondary | ICD-10-CM

## 2012-08-14 MED ORDER — HYDROCORTISONE ACETATE 25 MG RE SUPP: 25.0000 mg | Freq: Every day | RECTAL | Status: DC

## 2012-08-14 NOTE — Telephone Encounter (Signed)
Prescription sent to [facility]

## 2013-03-23 ENCOUNTER — Emergency Department (HOSPITAL_COMMUNITY)
Admission: EM | Admit: 2013-03-23 | Discharge: 2013-03-23 | Disposition: A | Discharge status: Home / Self Care | Payer: No Typology Code available for payment source | Attending: Emergency Medicine | Admitting: Emergency Medicine

## 2013-03-23 ENCOUNTER — Emergency Department (HOSPITAL_COMMUNITY): Payer: No Typology Code available for payment source

## 2013-03-23 ENCOUNTER — Telehealth: Payer: Self-pay | Admitting: Internal Medicine

## 2013-03-23 DIAGNOSIS — N210 Calculus in bladder: Secondary | ICD-10-CM | POA: Insufficient documentation

## 2013-03-23 DIAGNOSIS — R109 Unspecified abdominal pain: Secondary | ICD-10-CM

## 2013-03-23 DIAGNOSIS — R3915 Urgency of urination: Secondary | ICD-10-CM | POA: Insufficient documentation

## 2013-03-23 DIAGNOSIS — R112 Nausea with vomiting, unspecified: Secondary | ICD-10-CM | POA: Insufficient documentation

## 2013-03-23 DIAGNOSIS — R350 Frequency of micturition: Secondary | ICD-10-CM | POA: Insufficient documentation

## 2013-03-23 DIAGNOSIS — N200 Calculus of kidney: Secondary | ICD-10-CM | POA: Insufficient documentation

## 2013-03-23 LAB — URINALYSIS, ROUTINE W REFLEX MICROSCOPIC
Glucose, UA: NEGATIVE mg/dL
Leukocytes, UA: NEGATIVE
Protein, ur: NEGATIVE mg/dL
Specific Gravity, Urine: 1.013 (ref 1.005–1.030)
pH: 6 (ref 5.0–8.0)

## 2013-03-23 LAB — URINE MICROSCOPIC-ADD ON

## 2013-03-23 MED ORDER — TAMSULOSIN HCL 0.4 MG PO CAPS: 0.4000 mg | ORAL_CAPSULE | Freq: Every day | ORAL | Status: DC

## 2013-03-23 MED ORDER — OXYCODONE-ACETAMINOPHEN 5-325 MG PO TABS: 1.0000 | ORAL_TABLET | Freq: Four times a day (QID) | ORAL | Status: DC | PRN

## 2013-03-23 MED ORDER — ONDANSETRON HCL 4 MG PO TABS: 4.0000 mg | ORAL_TABLET | Freq: Four times a day (QID) | ORAL | Status: DC

## 2013-03-23 NOTE — ED Notes (Signed)
Pt complaining of pain since 3pm on 10/2. Pt states he called EMS after having a sharp pain that would not go away, then vomited and now pain is reduced to 4 on a scale of 0-10. Per EMS pt states he has been having increased urination.

## 2013-03-23 NOTE — ED Notes (Signed)
Bed: ZO10 Expected date:  Expected time:  Means of arrival:  Comments: EMS/right flank pain-male

## 2013-03-23 NOTE — ED Provider Notes (Signed)
Medical screening examination/treatment/procedure(s) were performed by non-physician practitioner and as supervising physician I was immediately available for consultation/collaboration.   Gwyneth Sprout, MD 03/23/13 [phone]

## 2013-03-23 NOTE — Telephone Encounter (Signed)
Ok to  sch next week Thx

## 2013-03-23 NOTE — ED Provider Notes (Signed)
CSN: [id number]     Arrival date & time 03/23/13  0148 History   First MD Initiated Contact with Patient 03/23/13 0149     Chief Complaint  Patient presents with  . Flank Pain   (Consider location/radiation/quality/duration/timing/severity/associated sxs/prior Treatment) HPI Comments: 57 year old healthy male presents to the emergency department with his mother via EMS complaining of right-sided flank pain beginning around 3:00 PM yesterday. Patient states pain began suddenly while he was driving, described as sharp, radiating around his right flank, rated 10 out of 10. Pain lasted throughout the day, prior to EMS arrival he vomited once causing the pain to decreased to 4/10. Currently pain 2/10. His mother applied an ice pack that area without relief. Admits to increased urinary frequency and urgency. Denies dysuria. Denies fever, chills, abdominal pain, chest pain, shortness of breath.  Patient is a 57 y.o. male presenting with flank pain. The history is provided by the patient and a parent.  Flank Pain Associated symptoms include nausea and vomiting. Pertinent negatives include no abdominal pain, chest pain, chills or fever.    No past medical history on file. No past surgical history on file. No family history on file. History  Substance Use Topics  . Smoking status: Not on file  . Smokeless tobacco: Not on file  . Alcohol Use: Not on file    Review of Systems  Constitutional: Negative for fever, chills and appetite change.  Respiratory: Negative for shortness of breath.   Cardiovascular: Negative for chest pain.  Gastrointestinal: Positive for nausea and vomiting. Negative for abdominal pain.  Genitourinary: Positive for urgency, frequency and flank pain. Negative for dysuria, scrotal swelling and testicular pain.  All other systems reviewed and are negative.    Allergies  Review of patient's allergies indicates not on file.  Home Medications  No current outpatient  prescriptions on file. BP 163/92  Pulse 56  Temp(Src) 98.5 F (36.9 C) (Oral)  Resp 18  SpO2 96% Physical Exam  Nursing note and vitals reviewed. Constitutional: He is oriented to person, place, and time. He appears well-developed and well-nourished. No distress.  HENT:  Head: Normocephalic and atraumatic.  Mouth/Throat: Oropharynx is clear and moist.  Eyes: Conjunctivae are normal.  Neck: Normal range of motion. Neck supple.  Cardiovascular: Normal rate, regular rhythm and normal heart sounds.   Pulmonary/Chest: Effort normal and breath sounds normal.  Abdominal: Soft. Bowel sounds are normal. He exhibits no distension. There is no tenderness. There is no rigidity, no rebound, no guarding, no CVA tenderness, no tenderness at McBurney's point and negative Murphy's sign.  Musculoskeletal: Normal range of motion. He exhibits no edema.  Neurological: He is alert and oriented to person, place, and time.  Skin: Skin is warm and dry. He is not diaphoretic.  Psychiatric: He has a normal mood and affect. His behavior is normal.    ED Course  Procedures (including critical care time) Labs Review Labs Reviewed  URINALYSIS, ROUTINE W REFLEX MICROSCOPIC - Abnormal; Notable for the following:    Hgb urine dipstick SMALL (*)    All other components within normal limits  URINE MICROSCOPIC-ADD ON   Imaging Review Ct Abdomen Pelvis Wo Contrast  03/23/2013   CLINICAL DATA:  Micro hematuria. Right flank pain.  EXAM: CT ABDOMEN AND PELVIS WITHOUT CONTRAST  TECHNIQUE: Multidetector CT imaging of the abdomen and pelvis was performed following the standard protocol without intravenous contrast.  COMPARISON:  None.  FINDINGS: There is a right hydronephrosis and perinephric stranding. No visible renal  or ureteral stones. There is a punctate 1-2 mm calcification in the right side of the bladder, likely recently passed stone. Prostate is mildly prominent. Urinary bladder is otherwise unremarkable.  Liver,  gallbladder, spleen, pancreas, adrenals and left kidney are unremarkable on this unenhanced study. Appendix is visualized and is normal. Bowel grossly unremarkable. No free fluid, free air, or adenopathy. Lung bases are clear. No effusions. Heart is normal size. No acute bony abnormality.  IMPRESSION: Punctate 1-2 mm stone in the right posterior bladder, likely recently passed stone. Mild right hydronephrosis and perinephric stranding.   Electronically Signed   By: Charlett Nose M.D.   On: 03/23/2013 03:39    MDM   1. Kidney stone   2. Bladder stone   3. Flank pain     Patient with sudden onset R flank pain, increased urinary frequency and urgency. He is well appearing in no apparent distress. Pain currently controlled, not nauseated at this time. Possible kidney stone. Will obtain CT abdomen pelvis, urinalysis. 4:03 AM CT scan showing punctate 1-2 mm on the right posterior bladder. No associated urinary tract infection. Discharge home with percocet, zofran, flomax. He will f/u with PCP. Return precautions given. Patient states understanding of treatment care plan and is agreeable.  Trevor Mace, PA-C 03/23/13 [phone]

## 2013-03-23 NOTE — Telephone Encounter (Signed)
Pt has an appt on 03/26/13 with Dr. Macario Golds.

## 2013-03-23 NOTE — Telephone Encounter (Signed)
Pt called request to reestablish with Dr. Macario Golds, last ov was 2005, but pt stated he saw Dr. Macario Golds last year (no record in epic). Pt stated the went to ER last night and now he need a hospital fu. Pt has [employer] for insurance. Please advise.

## 2013-03-26 ENCOUNTER — Other Ambulatory Visit (INDEPENDENT_AMBULATORY_CARE_PROVIDER_SITE_OTHER): Payer: No Typology Code available for payment source

## 2013-03-26 ENCOUNTER — Ambulatory Visit (INDEPENDENT_AMBULATORY_CARE_PROVIDER_SITE_OTHER): Payer: No Typology Code available for payment source | Admitting: Internal Medicine

## 2013-03-26 ENCOUNTER — Encounter: Payer: Self-pay | Admitting: Internal Medicine

## 2013-03-26 VITALS — BP 132/84 | HR 52 | Temp 96.8°F | Resp 12 | Wt 196.0 lb

## 2013-03-26 DIAGNOSIS — T50905A Adverse effect of unspecified drugs, medicaments and biological substances, initial encounter: Secondary | ICD-10-CM | POA: Insufficient documentation

## 2013-03-26 DIAGNOSIS — M545 Low back pain, unspecified: Secondary | ICD-10-CM

## 2013-03-26 DIAGNOSIS — N200 Calculus of kidney: Secondary | ICD-10-CM

## 2013-03-26 DIAGNOSIS — R635 Abnormal weight gain: Secondary | ICD-10-CM

## 2013-03-26 DIAGNOSIS — T887XXA Unspecified adverse effect of drug or medicament, initial encounter: Secondary | ICD-10-CM

## 2013-03-26 DIAGNOSIS — E785 Hyperlipidemia, unspecified: Secondary | ICD-10-CM

## 2013-03-26 LAB — CBC WITH DIFFERENTIAL/PLATELET
Basophils Relative: 1.7 % (ref 0.0–3.0)
Eosinophils Relative: 2 % (ref 0.0–5.0)
Lymphocytes Relative: 23.6 % (ref 12.0–46.0)
MCHC: 34.4 g/dL (ref 30.0–36.0)
Neutrophils Relative %: 61.4 % (ref 43.0–77.0)
Platelets: 219 10*3/uL (ref 150.0–400.0)
RBC: 4.98 Mil/uL (ref 4.22–5.81)
RDW: 12.4 % (ref 11.5–14.6)
WBC: 6.3 10*3/uL (ref 4.5–10.5)

## 2013-03-26 LAB — BASIC METABOLIC PANEL
BUN: 13 mg/dL (ref 6–23)
Calcium: 8.9 mg/dL (ref 8.4–10.5)
Creatinine, Ser: 1 mg/dL (ref 0.4–1.5)
GFR: 81.43 mL/min (ref >60.00)
Glucose, Bld: 97 mg/dL (ref 70–99)
Potassium: 4.6 mEq/L (ref 3.5–5.1)

## 2013-03-26 LAB — HEPATIC FUNCTION PANEL
ALT: 47 U/L (ref 0–53)
AST: 27 U/L (ref 0–37)
Albumin: 3.8 g/dL (ref 3.5–5.2)

## 2013-03-26 LAB — LIPID PANEL: Cholesterol: 184 mg/dL (ref 0–200)

## 2013-03-26 NOTE — Progress Notes (Signed)
Subjective:    Patient ID: [name], male    DOB: [birth year], 57 y.o.   MRN: [id number]  HPI  Not seen in 3 years C/o kidney stone - 03/23/13 - s/p ER visit. No pain now. He had an episode of difficulty breathing from Flomax: apnea like sx's F/u dyslipidemia  BP Readings from Last 3 Encounters:  03/26/13 132/84  03/23/13 132/90  09/21/11 136/90   Wt Readings from Last 3 Encounters:  03/26/13 196 lb (88.905 kg)  09/21/11 191 lb (86.637 kg)  08/25/11 191 lb (86.637 kg)      Review of Systems  Constitutional: Negative for chills, appetite change, fatigue and unexpected weight change.  HENT: Negative for nosebleeds, congestion, sore throat, sneezing, trouble swallowing and neck pain.   Eyes: Negative for itching and visual disturbance.  Respiratory: Negative for cough.   Cardiovascular: Negative for chest pain, palpitations and leg swelling.  Gastrointestinal: Negative for nausea, diarrhea, blood in stool and abdominal distention.  Endocrine: Negative for polydipsia.  Genitourinary: Positive for flank pain. Negative for dysuria, urgency, frequency, hematuria, decreased urine volume, penile pain and testicular pain.  Musculoskeletal: Positive for back pain. Negative for joint swelling and gait problem.  Skin: Negative for rash.  Allergic/Immunologic: Negative for food allergies.  Neurological: Negative for dizziness, tremors, speech difficulty, weakness, light-headedness and headaches.  Psychiatric/Behavioral: Negative for sleep disturbance, dysphoric mood and agitation. The patient is not nervous/anxious.        Objective:   Physical Exam  Constitutional: He is oriented to person, place, and time. He appears well-developed. No distress.  NAD  HENT:  Mouth/Throat: Oropharynx is clear and moist.  Eyes: Conjunctivae are normal. Pupils are equal, round, and reactive to light.  Neck: Normal range of motion. No JVD present. No thyromegaly present.  Cardiovascular:  Normal rate, regular rhythm, normal heart sounds and intact distal pulses.  Exam reveals no gallop and no friction rub.   No murmur heard. Pulmonary/Chest: Effort normal and breath sounds normal. No respiratory distress. He has no wheezes. He has no rales. He exhibits no tenderness.  Abdominal: Soft. Bowel sounds are normal. He exhibits no distension and no mass. There is no tenderness. There is no rebound and no guarding.  Musculoskeletal: Normal range of motion. He exhibits no edema and no tenderness.  Lymphadenopathy:    He has no cervical adenopathy.  Neurological: He is alert and oriented to person, place, and time. He has normal reflexes. No cranial nerve deficit. He exhibits normal muscle tone. He displays a negative Romberg sign. Coordination and gait normal.  No meningeal signs  Skin: Skin is warm and dry. No rash noted.  Psychiatric: He has a normal mood and affect. His behavior is normal. Judgment and thought content normal.   Lab Results  Component Value Date   WBC 6.3 03/26/2013   HGB 15.3 03/26/2013   HCT 44.6 03/26/2013   PLT 219.0 03/26/2013   GLUCOSE 97 03/26/2013   CHOL 184 03/26/2013   TRIG 163.0* 03/26/2013   HDL 51.30 03/26/2013   LDLDIRECT 140.9 06/03/2011   LDLCALC 100* 03/26/2013   ALT 47 03/26/2013   AST 27 03/26/2013   NA 136 03/26/2013   K 4.6 03/26/2013   CL 103 03/26/2013   CREATININE 1.0 03/26/2013   BUN 13 03/26/2013   CO2 29 03/26/2013   TSH 1.72 03/26/2013   PSA 0.49 05/22/2010      03/23/13 IMPRESSION:  Punctate 1-2 mm stone in the right posterior bladder, likely  recently  passed stone. Mild right hydronephrosis and perinephric  stranding.      Assessment & Plan:

## 2013-03-26 NOTE — Assessment & Plan Note (Signed)
Labs

## 2013-03-26 NOTE — Assessment & Plan Note (Signed)
CT IMPRESSION:  Punctate 1-2 mm stone in the right posterior bladder, likely  recently passed stone. Mild right hydronephrosis and perinephric  stranding.   No pain

## 2013-03-26 NOTE — Assessment & Plan Note (Signed)
10/14 R flank pain - renal colic

## 2013-03-26 NOTE — Assessment & Plan Note (Signed)
10/14 dyspnea on Flomax

## 2013-05-21 ENCOUNTER — Ambulatory Visit (INDEPENDENT_AMBULATORY_CARE_PROVIDER_SITE_OTHER): Payer: No Typology Code available for payment source | Admitting: Internal Medicine

## 2013-05-21 ENCOUNTER — Encounter: Payer: Self-pay | Admitting: Internal Medicine

## 2013-05-21 VITALS — BP 140/96 | HR 72 | Temp 98.4°F | Resp 16 | Wt 202.0 lb

## 2013-05-21 DIAGNOSIS — I10 Essential (primary) hypertension: Secondary | ICD-10-CM | POA: Insufficient documentation

## 2013-05-21 DIAGNOSIS — IMO0001 Reserved for inherently not codable concepts without codable children: Secondary | ICD-10-CM

## 2013-05-21 DIAGNOSIS — E785 Hyperlipidemia, unspecified: Secondary | ICD-10-CM

## 2013-05-21 DIAGNOSIS — N320 Bladder-neck obstruction: Secondary | ICD-10-CM

## 2013-05-21 DIAGNOSIS — R030 Elevated blood-pressure reading, without diagnosis of hypertension: Secondary | ICD-10-CM

## 2013-05-21 MED ORDER — TAMSULOSIN HCL 0.4 MG PO CAPS: 0.4000 mg | ORAL_CAPSULE | Freq: Every day | ORAL | Status: DC

## 2013-05-21 NOTE — Assessment & Plan Note (Signed)
BP Readings from Last 3 Encounters:  05/21/13 140/96  03/26/13 132/84  03/23/13 132/90   Wt Readings from Last 3 Encounters:  05/21/13 202 lb (91.627 kg)  03/26/13 196 lb (88.905 kg)  09/21/11 191 lb (86.637 kg)    

## 2013-05-21 NOTE — Progress Notes (Signed)
   Subjective:    Patient ID: [name], male    DOB: [birth year], 57 y.o.   MRN: [id number]  Urinary Frequency  This is a new problem. The current episode started more than 1 year ago. The problem occurs intermittently. The problem has been gradually worsening. The patient is experiencing no pain. There has been no fever. Associated symptoms include frequency, hesitancy and urgency. Pertinent negatives include no flank pain. He has tried nothing for the symptoms. The treatment provided no relief.   Wt Readings from Last 3 Encounters:  05/21/13 202 lb (91.627 kg)  03/26/13 196 lb (88.905 kg)  09/21/11 191 lb (86.637 kg)   BP Readings from Last 3 Encounters:  05/21/13 140/96  03/26/13 132/84  03/23/13 132/90       Review of Systems  Constitutional: Negative for fever, appetite change and fatigue.  Gastrointestinal: Negative for diarrhea and constipation.  Genitourinary: Positive for hesitancy, urgency and frequency. Negative for flank pain, decreased urine volume, discharge and genital sores.  Musculoskeletal: Negative for neck stiffness.  Psychiatric/Behavioral: The patient is not nervous/anxious.        Objective:   Physical Exam  Constitutional: He is oriented to person, place, and time. He appears well-developed. No distress.  NAD  HENT:  Mouth/Throat: Oropharynx is clear and moist.  Eyes: Conjunctivae are normal. Pupils are equal, round, and reactive to light.  Neck: Normal range of motion. No JVD present. No thyromegaly present.  Cardiovascular: Normal rate, regular rhythm, normal heart sounds and intact distal pulses.  Exam reveals no gallop and no friction rub.   No murmur heard. Pulmonary/Chest: Effort normal and breath sounds normal. No respiratory distress. He has no wheezes. He has no rales. He exhibits no tenderness.  Abdominal: Soft. Bowel sounds are normal. He exhibits no distension and no mass. There is no tenderness. There is no rebound and no  guarding.  Musculoskeletal: Normal range of motion. He exhibits no edema and no tenderness.  Lymphadenopathy:    He has no cervical adenopathy.  Neurological: He is alert and oriented to person, place, and time. He has normal reflexes. No cranial nerve deficit. He exhibits normal muscle tone. He displays a negative Romberg sign. Coordination and gait normal.  Skin: Skin is warm and dry. No rash noted.  Psychiatric: He has a normal mood and affect. His behavior is normal. Judgment and thought content normal.    Lab Results  Component Value Date   WBC 6.2 05/22/2013   HGB 15.7 05/22/2013   HCT 44.8 05/22/2013   PLT 234.0 05/22/2013   GLUCOSE 106* 05/22/2013   CHOL 200 05/22/2013   TRIG 153.0* 05/22/2013   HDL 54.50 05/22/2013   LDLDIRECT 140.9 06/03/2011   LDLCALC 115* 05/22/2013   ALT 51 05/22/2013   AST 34 05/22/2013   NA 138 05/22/2013   K 4.6 05/22/2013   CL 108 05/22/2013   CREATININE 1.1 05/22/2013   BUN 14 05/22/2013   CO2 26 05/22/2013   TSH 1.82 05/22/2013   PSA 1.06 05/22/2013         Assessment & Plan:

## 2013-05-21 NOTE — Patient Instructions (Signed)
BP Readings from Last 3 Encounters:  05/21/13 140/96  03/26/13 132/84  03/23/13 132/90   Wt Readings from Last 3 Encounters:  05/21/13 202 lb (91.627 kg)  03/26/13 196 lb (88.905 kg)  09/21/11 191 lb (86.637 kg)

## 2013-05-21 NOTE — Progress Notes (Signed)
Pre visit review using our clinic review tool, if applicable. No additional management support is needed unless otherwise documented below in the visit note. 

## 2013-05-21 NOTE — Assessment & Plan Note (Signed)
Continue with current prescription therapy as reflected on the Med list.  

## 2013-05-21 NOTE — Assessment & Plan Note (Signed)
Labs Try flomax  Urol ref

## 2013-05-22 ENCOUNTER — Other Ambulatory Visit (INDEPENDENT_AMBULATORY_CARE_PROVIDER_SITE_OTHER): Payer: No Typology Code available for payment source

## 2013-05-22 DIAGNOSIS — N320 Bladder-neck obstruction: Secondary | ICD-10-CM

## 2013-05-22 DIAGNOSIS — R030 Elevated blood-pressure reading, without diagnosis of hypertension: Secondary | ICD-10-CM

## 2013-05-22 DIAGNOSIS — IMO0001 Reserved for inherently not codable concepts without codable children: Secondary | ICD-10-CM

## 2013-05-22 DIAGNOSIS — E785 Hyperlipidemia, unspecified: Secondary | ICD-10-CM

## 2013-05-22 LAB — LIPID PANEL
Cholesterol: 200 mg/dL (ref 0–200)
HDL: 54.5 mg/dL (ref >39.00)
LDL Cholesterol: 115 mg/dL — ABNORMAL HIGH (ref 0–99)
Total CHOL/HDL Ratio: 4
Triglycerides: 153 mg/dL — ABNORMAL HIGH (ref 0.0–149.0)
VLDL: 30.6 mg/dL (ref 0.0–40.0)

## 2013-05-22 LAB — CBC WITH DIFFERENTIAL/PLATELET
Basophils Absolute: 0 10*3/uL (ref 0.0–0.1)
Basophils Relative: 0.4 % (ref 0.0–3.0)
Eosinophils Absolute: 0.1 10*3/uL (ref 0.0–0.7)
Eosinophils Relative: 2 % (ref 0.0–5.0)
HCT: 44.8 % (ref 39.0–52.0)
Lymphocytes Relative: 30 % (ref 12.0–46.0)
Lymphs Abs: 1.9 10*3/uL (ref 0.7–4.0)
MCHC: 34.9 g/dL (ref 30.0–36.0)
MCV: 88.2 fl (ref 78.0–100.0)
Monocytes Absolute: 0.8 10*3/uL (ref 0.1–1.0)
Neutro Abs: 3.5 10*3/uL (ref 1.4–7.7)
Platelets: 234 10*3/uL (ref 150.0–400.0)
RBC: 5.08 Mil/uL (ref 4.22–5.81)
RDW: 13.1 % (ref 11.5–14.6)
WBC: 6.2 10*3/uL (ref 4.5–10.5)

## 2013-05-22 LAB — PSA: PSA: 1.06 ng/mL (ref 0.10–4.00)

## 2013-05-22 LAB — TSH: TSH: 1.82 u[IU]/mL (ref 0.35–5.50)

## 2013-05-22 LAB — HEPATIC FUNCTION PANEL
ALT: 51 U/L (ref 0–53)
AST: 34 U/L (ref 0–37)
Bilirubin, Direct: 0.1 mg/dL (ref 0.0–0.3)
Total Bilirubin: 0.8 mg/dL (ref 0.3–1.2)
Total Protein: 6.5 g/dL (ref 6.0–8.3)

## 2013-05-22 LAB — BASIC METABOLIC PANEL
BUN: 14 mg/dL (ref 6–23)
CO2: 26 mEq/L (ref 19–32)
Chloride: 108 mEq/L (ref 96–112)
Potassium: 4.6 mEq/L (ref 3.5–5.1)

## 2013-05-22 LAB — URINALYSIS
Bilirubin Urine: NEGATIVE
Hgb urine dipstick: NEGATIVE
Ketones, ur: NEGATIVE
Specific Gravity, Urine: >=1.03 (ref 1.000–1.030)
Total Protein, Urine: NEGATIVE
Urine Glucose: NEGATIVE
Urobilinogen, UA: 0.2 (ref 0.0–1.0)

## 2013-05-28 ENCOUNTER — Encounter: Payer: Self-pay | Admitting: Internal Medicine

## 2013-05-30 ENCOUNTER — Ambulatory Visit (INDEPENDENT_AMBULATORY_CARE_PROVIDER_SITE_OTHER): Payer: No Typology Code available for payment source | Admitting: Internal Medicine

## 2013-05-30 ENCOUNTER — Encounter: Payer: Self-pay | Admitting: Internal Medicine

## 2013-05-30 VITALS — BP 162/98 | HR 72 | Temp 98.0°F | Resp 16 | Wt 202.0 lb

## 2013-05-30 DIAGNOSIS — R030 Elevated blood-pressure reading, without diagnosis of hypertension: Secondary | ICD-10-CM

## 2013-05-30 DIAGNOSIS — R079 Chest pain, unspecified: Secondary | ICD-10-CM

## 2013-05-30 DIAGNOSIS — IMO0001 Reserved for inherently not codable concepts without codable children: Secondary | ICD-10-CM

## 2013-05-30 DIAGNOSIS — R635 Abnormal weight gain: Secondary | ICD-10-CM

## 2013-05-30 DIAGNOSIS — E785 Hyperlipidemia, unspecified: Secondary | ICD-10-CM

## 2013-05-30 DIAGNOSIS — I10 Essential (primary) hypertension: Secondary | ICD-10-CM

## 2013-05-30 MED ORDER — ASPIRIN 325 MG PO TABS: 325.0000 mg | ORAL_TABLET | Freq: Every day | ORAL | Status: DC

## 2013-05-30 MED ORDER — LOSARTAN POTASSIUM 100 MG PO TABS: 100.0000 mg | ORAL_TABLET | Freq: Every day | ORAL | Status: DC

## 2013-05-30 MED ORDER — VITAMIN D 1000 UNITS PO TABS: 1000.0000 [IU] | ORAL_TABLET | Freq: Every day | ORAL | Status: AC

## 2013-05-30 NOTE — Assessment & Plan Note (Signed)
On Lipitor EKG

## 2013-05-30 NOTE — Assessment & Plan Note (Signed)
Added Losartan NAS diet and wt loss

## 2013-05-30 NOTE — Progress Notes (Signed)
Subjective:    Patient ID: [name], male    DOB: August 28, 1955, 57 y.o.   MRN: [id number]  Urinary Frequency  This is a new problem. The current episode started more than 1 year ago. The problem occurs intermittently. The problem has been gradually worsening. The patient is experiencing no pain. There has been no fever. Associated symptoms include frequency, hesitancy and urgency. Pertinent negatives include no flank pain. He has tried nothing for the symptoms. The treatment provided no relief.  Chest Pain  This is a new problem. The current episode started more than 1 month ago. The onset quality is undetermined. The problem occurs intermittently. The pain is present in the substernal region. The pain is moderate. The quality of the pain is described as dull and heavy. Pertinent negatives include no cough, fever, shortness of breath, syncope or weakness. He has tried nothing for the symptoms.  His past medical history is significant for hyperlipidemia and hypertension.  Pertinent negatives for past medical history include no CAD.  His family medical history is significant for sudden death in family.   Wt Readings from Last 3 Encounters:  05/30/13 202 lb (91.627 kg)  05/21/13 202 lb (91.627 kg)  03/26/13 196 lb (88.905 kg)   BP Readings from Last 3 Encounters:  05/30/13 162/98  05/21/13 140/96  03/26/13 132/84       Review of Systems  Constitutional: Negative for fever, appetite change and fatigue.  Respiratory: Positive for chest tightness. Negative for cough and shortness of breath.   Cardiovascular: Positive for chest pain. Negative for syncope.  Gastrointestinal: Negative for diarrhea and constipation.  Genitourinary: Positive for hesitancy, urgency and frequency. Negative for flank pain, decreased urine volume, discharge and genital sores.  Musculoskeletal: Negative for neck stiffness.  Neurological: Negative for weakness.  Psychiatric/Behavioral: The patient is  not nervous/anxious.        Objective:   Physical Exam  Constitutional: He is oriented to person, place, and time. He appears well-developed. No distress.  NAD  HENT:  Mouth/Throat: Oropharynx is clear and moist.  Eyes: Conjunctivae are normal. Pupils are equal, round, and reactive to light.  Neck: Normal range of motion. No JVD present. No thyromegaly present.  Cardiovascular: Normal rate, regular rhythm, normal heart sounds and intact distal pulses.  Exam reveals no gallop and no friction rub.   No murmur heard. Pulmonary/Chest: Effort normal and breath sounds normal. No respiratory distress. He has no wheezes. He has no rales. He exhibits no tenderness.  Abdominal: Soft. Bowel sounds are normal. He exhibits no distension and no mass. There is no tenderness. There is no rebound and no guarding.  Musculoskeletal: Normal range of motion. He exhibits no edema and no tenderness.  Lymphadenopathy:    He has no cervical adenopathy.  Neurological: He is alert and oriented to person, place, and time. He has normal reflexes. No cranial nerve deficit. He exhibits normal muscle tone. He displays a negative Romberg sign. Coordination and gait normal.  Skin: Skin is warm and dry. No rash noted.  Psychiatric: He has a normal mood and affect. His behavior is normal. Judgment and thought content normal.    Lab Results  Component Value Date   WBC 6.2 05/22/2013   HGB 15.7 05/22/2013   HCT 44.8 05/22/2013   PLT 234.0 05/22/2013   GLUCOSE 106* 05/22/2013   CHOL 200 05/22/2013   TRIG 153.0* 05/22/2013   HDL 54.50 05/22/2013   LDLDIRECT 140.9 06/03/2011   LDLCALC 115* 05/22/2013  ALT 51 05/22/2013   AST 34 05/22/2013   NA 138 05/22/2013   K 4.6 05/22/2013   CL 108 05/22/2013   CREATININE 1.1 05/22/2013   BUN 14 05/22/2013   CO2 26 05/22/2013   TSH 1.82 05/22/2013   PSA 1.06 05/22/2013   EKG OK      Assessment & Plan:

## 2013-05-30 NOTE — Assessment & Plan Note (Signed)
Added Losartan NAS diet and wt loss 

## 2013-05-30 NOTE — Progress Notes (Signed)
Pre visit review using our clinic review tool, if applicable. No additional management support is needed unless otherwise documented below in the visit note. 

## 2013-05-30 NOTE — Assessment & Plan Note (Addendum)
  NAS diet and wt loss adviced

## 2013-05-30 NOTE — Assessment & Plan Note (Signed)
2014 episodic Start ASA CL stress test

## 2013-05-30 NOTE — Patient Instructions (Signed)
Loose weight Low salt diet

## 2013-06-01 ENCOUNTER — Telehealth: Payer: Self-pay | Admitting: Internal Medicine

## 2013-06-01 NOTE — Telephone Encounter (Signed)
Pt request phone call from Dr. Macario Golds or the assistant about the stress test that Dr. Macario Golds want him to do at [facility]. Please call pt

## 2013-06-01 NOTE — Telephone Encounter (Signed)
Called and left a VM.  Test is in the works - not scheduled yet.

## 2013-06-29 ENCOUNTER — Telehealth: Payer: Self-pay

## 2013-06-29 NOTE — Telephone Encounter (Signed)
The patient called and is hoping to get a medication refill.  However, I tired several times asking which medication it was, and could not understand his response.  I apologize for the inconvenience.   Pt Callback numbers -  [phone]

## 2013-06-29 NOTE — Telephone Encounter (Signed)
Left mess for patient to call back.  

## 2013-07-02 ENCOUNTER — Telehealth: Payer: Self-pay | Admitting: *Deleted

## 2013-07-02 NOTE — Telephone Encounter (Signed)
Called pt- number is busy. Will try again later. Duplicate phone notes open re: the same. Closing this note. See phone encounter dated 07/02/13.

## 2013-07-02 NOTE — Telephone Encounter (Signed)
Called pt to clarify what is needed. Number is busy. Will try again later.

## 2013-07-02 NOTE — Telephone Encounter (Signed)
Patient called back at 0952 this morning....could NOT understand half of what he said..not sure what he wants.  Something regarding a medication (possibly floxmax from the best I can understand him).  Please advise.    CB# [phone]

## 2013-07-02 NOTE — Telephone Encounter (Signed)
Patient has called at [phone] and at 1454 today wanting his medication refilled....still am not able to understand what he is requesting.  CB# [phone]

## 2013-07-03 ENCOUNTER — Other Ambulatory Visit: Payer: Self-pay | Admitting: Internal Medicine

## 2013-07-03 MED ORDER — DOXAZOSIN MESYLATE 2 MG PO TABS: 2.0000 mg | ORAL_TABLET | Freq: Every day | ORAL | Status: DC

## 2013-07-03 NOTE — Telephone Encounter (Signed)
Pt here with his mother for OV today--he states he needs Rf on gen Flomax. This was addressed by PCP today. Closing phone note.

## 2013-07-03 NOTE — Telephone Encounter (Signed)
Number busy again. Will try again later.

## 2013-07-05 ENCOUNTER — Encounter: Payer: Self-pay | Admitting: Cardiovascular Disease

## 2013-07-09 ENCOUNTER — Encounter (HOSPITAL_COMMUNITY): Payer: No Typology Code available for payment source

## 2013-07-16 ENCOUNTER — Telehealth: Payer: Self-pay | Admitting: Internal Medicine

## 2013-07-16 ENCOUNTER — Telehealth: Payer: Self-pay

## 2013-07-16 NOTE — Telephone Encounter (Signed)
The patient has concerns about his stress test being scheduled.  He states the company informed him he can not have the test done on 2/4 due to it not being authorized.

## 2013-07-16 NOTE — Telephone Encounter (Signed)
Pt scheduled for 07-25-2013@9 :00 for his stress test insurance [facility] is not approving procedure faxed over clinicals on 07-06-2013 still requesting Dr to do a peer to peer by calling  [phone] pt ID# [id number] group # [id number] . i informed pt of this . Please advise the patient still not approve for this procedure

## 2013-07-16 NOTE — Telephone Encounter (Signed)
Hi [name] I called [employer] and they authorized the test --- auth # [id number] exp 3.12.15 --pls sch the test. Thx

## 2013-07-25 ENCOUNTER — Encounter: Payer: Self-pay | Admitting: Physician Assistant

## 2013-07-25 ENCOUNTER — Encounter: Payer: Self-pay | Admitting: Cardiology

## 2013-07-25 ENCOUNTER — Ambulatory Visit (HOSPITAL_COMMUNITY): Payer: 59 | Attending: Internal Medicine | Admitting: Radiology

## 2013-07-25 VITALS — BP 124/91 | Ht 69.0 in | Wt 198.0 lb

## 2013-07-25 DIAGNOSIS — I10 Essential (primary) hypertension: Secondary | ICD-10-CM | POA: Insufficient documentation

## 2013-07-25 DIAGNOSIS — Z8249 Family history of ischemic heart disease and other diseases of the circulatory system: Secondary | ICD-10-CM | POA: Insufficient documentation

## 2013-07-25 DIAGNOSIS — R0602 Shortness of breath: Secondary | ICD-10-CM

## 2013-07-25 DIAGNOSIS — R079 Chest pain, unspecified: Secondary | ICD-10-CM | POA: Insufficient documentation

## 2013-07-25 DIAGNOSIS — E785 Hyperlipidemia, unspecified: Secondary | ICD-10-CM | POA: Insufficient documentation

## 2013-07-25 MED ORDER — TECHNETIUM TC 99M SESTAMIBI GENERIC - CARDIOLITE
10.0000 | Freq: Once | INTRAVENOUS | Status: AC | PRN
  Administered 2013-07-25: 10 via INTRAVENOUS

## 2013-07-25 MED ORDER — TECHNETIUM TC 99M SESTAMIBI GENERIC - CARDIOLITE
30.0000 | Freq: Once | INTRAVENOUS | Status: AC | PRN
  Administered 2013-07-25: 30 via INTRAVENOUS

## 2013-07-25 NOTE — Progress Notes (Signed)
[facility] [address] [place], [place] [address] [phone]    Cardiology Nuclear Med Study  [name] is a 58 y.o. male     MRN : [id number]     DOB: 1956/06/13  Procedure Date: 07/25/2013  Nuclear Med Background Indication for Stress Test:  Evaluation for Ischemia History:NoKnown History of CAD;Previous Nuclear Study 06/09/04,Nml EF:62% Cardiac Risk Factors: Family History - CAD, Hypertension and Lipids  Symptoms:  Chest Pain   Nuclear Pre-Procedure Caffeine/Decaff Intake:  None > 12 hrs NPO After: 9:00pm   Lungs:  clear O2 Sat: 94% on room air. IV 0.9% NS with Angio Cath:  22g  IV Site: R Antecubital x 1, tolerated well IV Started by:  Irven Baltimore, RN  Chest Size (in):  40 Cup Size: n/a  Height: 5\' 9"  (1.753 m)  Weight:  198 lb (89.812 kg)  BMI:  Body mass index is 29.23 kg/(m^2). Tech Comments:  Patient held Losartan x 24 hrs    Nuclear Med Study 1 or 2 day study: 1 day  Stress Test Type:  Stress  Reading MD: N/A  Order Authorizing Provider:  Walker Kehr, MD  Resting Radionuclide: Technetium 44m Sestamibi  Resting Radionuclide Dose: 11.0 mCi   Stress Radionuclide:  Technetium 54m Sestamibi  Stress Radionuclide Dose: 33.0 mCi           Stress Protocol Rest HR: 53 Stress HR: 146  Rest BP: 124/91 Stress BP: 185/86  Exercise Time (min): 11:01 METS: 13.30   Predicted Max HR: 165 bpm % Max HR: 88.48 bpm Rate Pressure Product: 27010   Dose of Adenosine (mg):  n/a Dose of Lexiscan: n/a mg  Dose of Atropine (mg): n/a Dose of Dobutamine: n/a mcg/kg/min (at max HR)  Stress Test Technologist: Ileene Hutchinson, EMT-P  Nuclear Technologist:  Vedia Pereyra, CNMT     Rest Procedure:  Myocardial perfusion imaging was performed at rest 45 minutes following the intravenous administration of Technetium 54m Sestamibi. Rest ECG: Sinus bradycardia with no ST changes.  Stress Procedure:  The patient exercised on the treadmill  utilizing the Bruce Protocol for 11:01 minutes. The patient stopped due to SOB and denied any chest pain.  Technetium 41m Sestamibi was injected at peak exercise and myocardial perfusion imaging was performed after a brief delay. Stress ECG: No significant ST segment change suggestive of ischemia.  QPS Raw Data Images:  Acquisition technically good; normal left ventricular size. Stress Images:  Normal homogeneous uptake in all areas of the myocardium. Rest Images:  Normal homogeneous uptake in all areas of the myocardium. Subtraction (SDS):  No evidence of ischemia. Transient Ischemic Dilatation (Normal <1.22):  0.90 Lung/Heart Ratio (Normal <0.45):  0.32  Quantitative Gated Spect Images QGS EDV:  107 ml QGS ESV:  50 ml  Impression Exercise Capacity:  Good exercise capacity. BP Response:  Normal blood pressure response. Clinical Symptoms:  There is dyspnea. ECG Impression:  No significant ST segment change suggestive of ischemia. Comparison with Prior Nuclear Study: No images to compare  Overall Impression:  Normal stress nuclear study.  LV Ejection Fraction: 53%.  LV Wall Motion:  NL LV Function; NL Wall Motion  [name]

## 2013-07-31 ENCOUNTER — Telehealth: Payer: Self-pay | Admitting: Internal Medicine

## 2013-07-31 NOTE — Telephone Encounter (Signed)
[name], please, inform patient that his heart stress test was normal - good news! Thx

## 2013-08-01 NOTE — Telephone Encounter (Signed)
Left detailed mess informing pt of below.  

## 2013-09-07 ENCOUNTER — Ambulatory Visit (INDEPENDENT_AMBULATORY_CARE_PROVIDER_SITE_OTHER): Payer: 59 | Admitting: Internal Medicine

## 2013-09-07 ENCOUNTER — Encounter: Payer: Self-pay | Admitting: Internal Medicine

## 2013-09-07 ENCOUNTER — Ambulatory Visit (INDEPENDENT_AMBULATORY_CARE_PROVIDER_SITE_OTHER)
Admission: RE | Admit: 2013-09-07 | Discharge: 2013-09-07 | Disposition: A | Discharge status: Home / Self Care | Payer: 59 | Source: Ambulatory Visit | Attending: Internal Medicine | Admitting: Internal Medicine

## 2013-09-07 VITALS — BP 134/80 | HR 88 | Temp 97.8°F | Resp 16 | Ht 69.0 in | Wt 208.8 lb

## 2013-09-07 DIAGNOSIS — R059 Cough, unspecified: Secondary | ICD-10-CM

## 2013-09-07 DIAGNOSIS — R05 Cough: Secondary | ICD-10-CM

## 2013-09-07 DIAGNOSIS — J069 Acute upper respiratory infection, unspecified: Secondary | ICD-10-CM

## 2013-09-07 MED ORDER — HYDROCODONE-HOMATROPINE 5-1.5 MG/5ML PO SYRP: 5.0000 mL | ORAL_SOLUTION | Freq: Three times a day (TID) | ORAL | Status: DC | PRN

## 2013-09-07 NOTE — Progress Notes (Signed)
   Subjective:    Patient ID: [name], male    DOB: 13-Feb-1956, 58 y.o.   MRN: [id number]  Cough This is a new problem. The current episode started yesterday. The problem has been unchanged. The cough is non-productive. Associated symptoms include a sore throat. Pertinent negatives include no chest pain, chills, ear congestion, ear pain, fever, headaches, heartburn, hemoptysis, myalgias, nasal congestion, postnasal drip, rash, rhinorrhea, shortness of breath, sweats, weight loss or wheezing. Nothing aggravates the symptoms. He has tried OTC cough suppressant for the symptoms. The treatment provided mild relief. There is no history of asthma, bronchiectasis, bronchitis, COPD, emphysema, environmental allergies or pneumonia.      Review of Systems  Constitutional: Negative.  Negative for fever, chills, weight loss, diaphoresis, appetite change and fatigue.  HENT: Positive for sore throat. Negative for congestion, ear pain, facial swelling, postnasal drip, rhinorrhea, sinus pressure, sneezing, trouble swallowing and voice change.   Eyes: Negative.   Respiratory: Positive for cough. Negative for apnea, hemoptysis, choking, chest tightness, shortness of breath, wheezing and stridor.   Cardiovascular: Negative.  Negative for chest pain, palpitations and leg swelling.  Gastrointestinal: Negative.  Negative for heartburn, nausea, vomiting, abdominal pain, diarrhea, constipation and blood in stool.  Endocrine: Negative.   Genitourinary: Negative.   Musculoskeletal: Negative.  Negative for arthralgias and myalgias.  Skin: Negative.  Negative for rash.  Allergic/Immunologic: Negative.  Negative for environmental allergies.  Neurological: Negative.  Negative for dizziness and headaches.  Hematological: Negative.  Negative for adenopathy. Does not bruise/bleed easily.  Psychiatric/Behavioral: Negative.        Objective:   Physical Exam  Vitals reviewed. Constitutional: He is oriented  to person, place, and time. He appears well-developed and well-nourished.  Non-toxic appearance. He does not have a sickly appearance. He does not appear ill. No distress.  HENT:  Head: Normocephalic and atraumatic.  Mouth/Throat: Oropharynx is clear and moist. No oropharyngeal exudate.  Eyes: Conjunctivae are normal. Right eye exhibits no discharge. Left eye exhibits no discharge. No scleral icterus.  Neck: Normal range of motion. Neck supple. No JVD present. No tracheal deviation present. No thyromegaly present.  Cardiovascular: Normal rate, regular rhythm, normal heart sounds and intact distal pulses.  Exam reveals no gallop and no friction rub.   No murmur heard. Pulmonary/Chest: Effort normal and breath sounds normal. No accessory muscle usage or stridor. Not tachypneic. No respiratory distress. He has no decreased breath sounds. He has no wheezes. He has no rhonchi. He has no rales. He exhibits no tenderness.  Abdominal: Soft. Bowel sounds are normal. He exhibits no distension and no mass. There is no tenderness. There is no rebound and no guarding.  Musculoskeletal: Normal range of motion. He exhibits no edema and no tenderness.  Lymphadenopathy:    He has no cervical adenopathy.  Neurological: He is oriented to person, place, and time.  Skin: Skin is warm and dry. No rash noted. He is not diaphoretic. No erythema. No pallor.  Psychiatric: He has a normal mood and affect. His behavior is normal. Judgment and thought content normal.          Assessment & Plan:

## 2013-09-07 NOTE — Assessment & Plan Note (Signed)
I will check his CXR to see if there is any lung pathology to explain his cough For now, he appears to have a viral URI so will treat with hycodan

## 2013-09-07 NOTE — Patient Instructions (Signed)

## 2013-09-07 NOTE — Assessment & Plan Note (Signed)
This is viral so antibiotics were not prescribed He will try hycodan for the cough

## 2013-09-07 NOTE — Progress Notes (Signed)
Pre visit review using our clinic review tool, if applicable. No additional management support is needed unless otherwise documented below in the visit note. 

## 2013-09-10 ENCOUNTER — Encounter: Payer: Self-pay | Admitting: Internal Medicine

## 2013-09-10 ENCOUNTER — Ambulatory Visit (INDEPENDENT_AMBULATORY_CARE_PROVIDER_SITE_OTHER): Payer: 59 | Admitting: Internal Medicine

## 2013-09-10 VITALS — BP 140/90 | HR 80 | Temp 98.5°F | Resp 16 | Wt 204.0 lb

## 2013-09-10 DIAGNOSIS — R059 Cough, unspecified: Secondary | ICD-10-CM

## 2013-09-10 DIAGNOSIS — G5713 Meralgia paresthetica, bilateral lower limbs: Secondary | ICD-10-CM | POA: Insufficient documentation

## 2013-09-10 DIAGNOSIS — R05 Cough: Secondary | ICD-10-CM

## 2013-09-10 DIAGNOSIS — J069 Acute upper respiratory infection, unspecified: Secondary | ICD-10-CM

## 2013-09-10 DIAGNOSIS — G5710 Meralgia paresthetica, unspecified lower limb: Secondary | ICD-10-CM

## 2013-09-10 MED ORDER — AZITHROMYCIN 250 MG PO TABS: ORAL_TABLET | ORAL | Status: DC

## 2013-09-10 NOTE — Assessment & Plan Note (Signed)
Worse Added Zpac

## 2013-09-10 NOTE — Patient Instructions (Signed)
Use over-the-counter  "cold" medicines  such as  "Afrin" nasal spray for nasal congestion as directed instead. Use" Delsym" or" Robitussin" cough syrup varietis for cough.  You can use plain "Tylenol" or "Advil" for fever, chills and achyness.  Please, make an appointment if you are not better or if you're worse.  

## 2013-09-10 NOTE — Progress Notes (Signed)
Pre visit review using our clinic review tool, if applicable. No additional management support is needed unless otherwise documented below in the visit note. 

## 2013-09-10 NOTE — Assessment & Plan Note (Signed)
Start Zpac 

## 2013-09-10 NOTE — Progress Notes (Signed)
   Subjective:    Patient ID: [name], male    DOB: December 26, 1955, 58 y.o.   MRN: [id number]  URI  This is a new problem. The current episode started in the past 7 days. The problem has been rapidly worsening. There has been no fever. Associated symptoms include coughing, rhinorrhea, sinus pain and swollen glands. Pertinent negatives include no chest pain or wheezing. He has tried antihistamine, sleep, steam and acetaminophen for the symptoms. The treatment provided no relief.    C/o L thigh and numbness x long time - worse over past 2 d  Review of Systems  Constitutional: Positive for chills.  HENT: Positive for rhinorrhea.   Respiratory: Positive for cough. Negative for wheezing.   Cardiovascular: Negative for chest pain.       Objective:   Physical Exam  Constitutional: He is oriented to person, place, and time. He appears well-developed. No distress.  NAD  HENT:  eryth throat, nares  Eyes: Conjunctivae are normal. Pupils are equal, round, and reactive to light.  Neck: Normal range of motion. No JVD present. No thyromegaly present.  Cardiovascular: Normal rate, regular rhythm, normal heart sounds and intact distal pulses.  Exam reveals no gallop and no friction rub.   No murmur heard. Pulmonary/Chest: Effort normal and breath sounds normal. No respiratory distress. He has no wheezes. He has no rales. He exhibits no tenderness.  Abdominal: Soft. Bowel sounds are normal. He exhibits no distension and no mass. There is no tenderness. There is no rebound and no guarding.  Musculoskeletal: Normal range of motion. He exhibits no edema and no tenderness.  Lymphadenopathy:    He has no cervical adenopathy.  Neurological: He is alert and oriented to person, place, and time. He has normal reflexes. No cranial nerve deficit. He exhibits normal muscle tone. He displays a negative Romberg sign. Coordination and gait normal.  No meningeal signs  Skin: Skin is warm and dry. No rash  noted.  Psychiatric: He has a normal mood and affect. His behavior is normal. Judgment and thought content normal.   Oval 12x10 cm area of decreased pinprick sensation on L lat thigh       Assessment & Plan:

## 2013-09-10 NOTE — Assessment & Plan Note (Addendum)
L side 3/15  Discussed wt loss, loose belt

## 2013-09-13 ENCOUNTER — Telehealth: Payer: Self-pay | Admitting: Internal Medicine

## 2013-09-13 MED ORDER — LEVOFLOXACIN 500 MG PO TABS: 500.0000 mg | ORAL_TABLET | Freq: Every day | ORAL | Status: DC

## 2013-09-13 NOTE — Telephone Encounter (Signed)
Patient received antibiotic on last visit.  The antibiotic has not helped at all.  Patient is requesting a different antibiotic. Pharmacy is [facility] on [address]

## 2013-09-13 NOTE — Telephone Encounter (Signed)
Start Levaquin - emailed Thx

## 2013-09-14 ENCOUNTER — Telehealth: Payer: Self-pay

## 2013-09-14 ENCOUNTER — Ambulatory Visit (INDEPENDENT_AMBULATORY_CARE_PROVIDER_SITE_OTHER)
Admission: RE | Admit: 2013-09-14 | Discharge: 2013-09-14 | Disposition: A | Discharge status: Home / Self Care | Payer: 59 | Source: Ambulatory Visit | Attending: Internal Medicine | Admitting: Internal Medicine

## 2013-09-14 DIAGNOSIS — R05 Cough: Secondary | ICD-10-CM

## 2013-09-14 DIAGNOSIS — R058 Other specified cough: Secondary | ICD-10-CM

## 2013-09-14 DIAGNOSIS — R059 Cough, unspecified: Secondary | ICD-10-CM

## 2013-09-14 NOTE — Telephone Encounter (Signed)
Phone call from patient stating he has finished the antibiotics prescribed and still feels bad. Still coughing. He is requesting an xray. He does not have a temperature currently. Please advise.  Cb# [phone]

## 2013-09-14 NOTE — Telephone Encounter (Signed)
Order has been placed.   Patient aware

## 2013-09-14 NOTE — Telephone Encounter (Signed)
OK CXR Dx cough He needs to start Levaquin - it was called in Thx

## 2013-09-15 ENCOUNTER — Ambulatory Visit (INDEPENDENT_AMBULATORY_CARE_PROVIDER_SITE_OTHER): Payer: 59 | Admitting: Emergency Medicine

## 2013-09-15 VITALS — BP 124/76 | HR 73 | Temp 98.0°F | Resp 17 | Ht 70.0 in | Wt 201.0 lb

## 2013-09-15 DIAGNOSIS — R0602 Shortness of breath: Secondary | ICD-10-CM

## 2013-09-15 DIAGNOSIS — R059 Cough, unspecified: Secondary | ICD-10-CM

## 2013-09-15 DIAGNOSIS — R05 Cough: Secondary | ICD-10-CM

## 2013-09-15 LAB — POCT INFLUENZA A/B
INFLUENZA A, POC: NEGATIVE
Influenza B, POC: NEGATIVE

## 2013-09-15 LAB — POCT CBC
Granulocyte percent: 63.9 %G (ref 37–80)
HCT, POC: 41.3 % — AB (ref 43.5–53.7)
Hemoglobin: 13.5 g/dL — AB (ref 14.1–18.1)
LYMPH, POC: 1.5 (ref 0.6–3.4)
MCH: 30.4 pg (ref 27–31.2)
MCHC: 32.7 g/dL (ref 31.8–35.4)
MCV: 93 fL (ref 80–97)
MID (CBC): 0.4 (ref 0–0.9)
MPV: 8 fL (ref 0–99.8)
PLATELET COUNT, POC: 257 10*3/uL (ref 142–424)
POC Granulocyte: 3.4 (ref 2–6.9)
POC LYMPH PERCENT: 28.8 %L (ref 10–50)
POC MID %: 7.3 %M (ref 0–12)
RBC: 4.44 M/uL — AB (ref 4.69–6.13)
RDW, POC: 13.7 %
WBC: 5.3 10*3/uL (ref 4.6–10.2)

## 2013-09-15 MED ORDER — ALBUTEROL SULFATE (2.5 MG/3ML) 0.083% IN NEBU
2.5000 mg | INHALATION_SOLUTION | Freq: Once | RESPIRATORY_TRACT | Status: AC
  Administered 2013-09-15: 2.5 mg via RESPIRATORY_TRACT

## 2013-09-15 MED ORDER — ALBUTEROL SULFATE HFA 108 (90 BASE) MCG/ACT IN AERS: 2.0000 | INHALATION_SPRAY | RESPIRATORY_TRACT | Status: DC | PRN

## 2013-09-15 MED ORDER — IPRATROPIUM BROMIDE 0.02 % IN SOLN
0.5000 mg | Freq: Once | RESPIRATORY_TRACT | Status: AC
  Administered 2013-09-15: 0.5 mg via RESPIRATORY_TRACT

## 2013-09-15 MED ORDER — PREDNISONE 20 MG PO TABS: ORAL_TABLET | ORAL | Status: DC

## 2013-09-15 NOTE — Progress Notes (Signed)
This chart was scribed for [name] A. Everlene Farrier, MD by Stacy Gardner, [facility] Scribe. The patient was seen in room and the patient's care was started at 11:38 AM.   Cough This is a new problem. The current episode started 1 to 4 weeks ago. The problem occurs constantly. The cough is productive of sputum. Associated symptoms include a fever and myalgias.  Shortness of Breath Associated symptoms include a fever and sputum production.   HPI Comments: [name] is a 58 y.o. male who arrives to the [facility] complaining of progressively worsening cough for the past eleven days. He produces dark to light green/yellow sputum with coughing. He has the associated symptoms of fever (101). Denies any changes to his BP medications. PtDuring his initial visit to the [facility] on the same complaint on 3/20 pt had a xray performed. The x-ray was did not reveal any complications. Pt was seen 3/27 again for the same compliant and for another x-ray, it was also negative for any concerning results. He returned yesterday and was given Zpac, Azithromycin and Levofloxacin for his symptoms. Pt reports that he took the antibiotics and now his tongue has a whitish appearance. Denies taking Levoflaxin. He has also tried Mucinex to no relief. Pt also complains of left upper leg pain that is worse with exertion. He states that he is unable to standing up more than 15 consecutive times. Pt has spams to his thigh when he lays. He also has numbness to his upper left thigh. Denies any allergies. Pt is currently unemployed. Denies work related exposure to chemicals. Denies hx of asthma. Pt mentions that his mother has a hx of asthama   Review of Systems  Constitutional: Positive for fever.  Respiratory: Positive for cough and sputum production.   Musculoskeletal: Positive for myalgias.    Physical Exam HEENT. TMs are clear nose normal throat is normal. Neck is supple to chest exam  reveals prolonged expiration with rhonchi and wheezes both lower lobes. Cardiac exam has a regular rate without murmurs examination of the left leg reveals no tenderness over the left  thigh  Filed Vitals:   09/15/13 1129  BP: 124/76  Pulse: 73  Temp: 98 F (36.7 C)  Resp: 17   chest x-ray reviewed and appears to show atelectasis in the left base but no pneumonic infiltrates Results for orders placed in visit on 09/15/13  POCT CBC      Result Value Ref Range   WBC 5.3  4.6 - 10.2 K/uL   Lymph, poc 1.5  0.6 - 3.4   POC LYMPH PERCENT 28.8  10 - 50 %L   MID (cbc) 0.4  0 - 0.9   POC MID % 7.3  0 - 12 %M   POC Granulocyte 3.4  2 - 6.9   Granulocyte percent 63.9  37 - 80 %G   RBC 4.44 (*) 4.69 - 6.13 M/uL   Hemoglobin 13.5 (*) 14.1 - 18.1 g/dL   HCT, POC 41.3 (*) 43.5 - 53.7 %   MCV 93.0  80 - 97 fL   MCH, POC 30.4  27 - 31.2 pg   MCHC 32.7  31.8 - 35.4 g/dL   RDW, POC 13.7     Platelet Count, POC 257  142 - 424 K/uL   MPV 8.0  0 - 99.8 fL  POCT INFLUENZA A/B      Result Value Ref Range   Influenza A, POC Negative  Influenza B, POC Negative     Assessment patient appears to have bronchospasm as the source of his cough. Will treat with Tessalon Perles have him hold the Levaquin for now and give him a prednisone in a taper along with an albuterol HFA inhaler.  COORDINATION OF CARE:  11:38 AM Discussed course of care with pt which includes laboratory tests and inhaler . Pt understands and agrees.    Plan

## 2013-09-18 ENCOUNTER — Ambulatory Visit: Payer: 59 | Admitting: Internal Medicine

## 2014-02-01 ENCOUNTER — Ambulatory Visit: Payer: Self-pay

## 2014-02-04 ENCOUNTER — Ambulatory Visit (INDEPENDENT_AMBULATORY_CARE_PROVIDER_SITE_OTHER): Payer: 59 | Admitting: *Deleted

## 2014-02-04 DIAGNOSIS — Z111 Encounter for screening for respiratory tuberculosis: Secondary | ICD-10-CM

## 2014-02-06 LAB — TB SKIN TEST
INDURATION: 0 mm
TB SKIN TEST: NEGATIVE

## 2014-03-13 ENCOUNTER — Telehealth: Payer: Self-pay

## 2014-03-13 NOTE — Telephone Encounter (Signed)
Flu vaccine docu

## 2014-05-27 ENCOUNTER — Other Ambulatory Visit: Payer: Self-pay | Admitting: Internal Medicine

## 2014-06-05 ENCOUNTER — Other Ambulatory Visit: Payer: Self-pay | Admitting: Internal Medicine

## 2014-07-16 ENCOUNTER — Other Ambulatory Visit: Payer: Self-pay | Admitting: Internal Medicine

## 2014-07-23 ENCOUNTER — Other Ambulatory Visit: Payer: Self-pay | Admitting: Internal Medicine

## 2014-07-23 MED ORDER — LOSARTAN POTASSIUM 100 MG PO TABS: 100.0000 mg | ORAL_TABLET | Freq: Every day | ORAL | Status: DC

## 2014-07-23 NOTE — Telephone Encounter (Signed)
Pt called in looking for refill on   Doxazosin  Lasartan    To [facility] on file

## 2014-08-11 ENCOUNTER — Other Ambulatory Visit: Payer: Self-pay | Admitting: Internal Medicine

## 2014-08-11 DIAGNOSIS — E785 Hyperlipidemia, unspecified: Secondary | ICD-10-CM

## 2014-08-15 ENCOUNTER — Other Ambulatory Visit (INDEPENDENT_AMBULATORY_CARE_PROVIDER_SITE_OTHER): Payer: 59

## 2014-08-15 DIAGNOSIS — E785 Hyperlipidemia, unspecified: Secondary | ICD-10-CM

## 2014-08-15 LAB — BASIC METABOLIC PANEL
BUN: 20 mg/dL (ref 6–23)
CALCIUM: 8.9 mg/dL (ref 8.4–10.5)
CO2: 27 mEq/L (ref 19–32)
Chloride: 106 mEq/L (ref 96–112)
Creatinine, Ser: 1.13 mg/dL (ref 0.40–1.50)
GFR: 71.17 mL/min (ref >60.00)
Glucose, Bld: 100 mg/dL — ABNORMAL HIGH (ref 70–99)
Potassium: 4.1 mEq/L (ref 3.5–5.1)
Sodium: 137 mEq/L (ref 135–145)

## 2014-08-15 LAB — HEPATIC FUNCTION PANEL
ALT: 37 U/L (ref 0–53)
AST: 23 U/L (ref 0–37)
Albumin: 4 g/dL (ref 3.5–5.2)
Alkaline Phosphatase: 56 U/L (ref 39–117)
BILIRUBIN DIRECT: 0.1 mg/dL (ref 0.0–0.3)
BILIRUBIN TOTAL: 0.5 mg/dL (ref 0.2–1.2)
TOTAL PROTEIN: 6.7 g/dL (ref 6.0–8.3)

## 2014-08-15 LAB — TSH: TSH: 1.33 u[IU]/mL (ref 0.35–4.50)

## 2014-08-15 LAB — LIPID PANEL
CHOLESTEROL: 166 mg/dL (ref 0–200)
HDL: 48.3 mg/dL (ref >39.00)
LDL Cholesterol: 91 mg/dL (ref 0–99)
NonHDL: 117.7
Total CHOL/HDL Ratio: 3
Triglycerides: 135 mg/dL (ref 0.0–149.0)
VLDL: 27 mg/dL (ref 0.0–40.0)

## 2014-10-02 ENCOUNTER — Other Ambulatory Visit: Payer: Self-pay | Admitting: Internal Medicine

## 2014-10-10 ENCOUNTER — Encounter: Payer: Self-pay | Admitting: Internal Medicine

## 2014-10-10 ENCOUNTER — Ambulatory Visit (INDEPENDENT_AMBULATORY_CARE_PROVIDER_SITE_OTHER): Payer: 59 | Admitting: Internal Medicine

## 2014-10-10 VITALS — BP 122/84 | HR 83 | Wt 204.0 lb

## 2014-10-10 DIAGNOSIS — N320 Bladder-neck obstruction: Secondary | ICD-10-CM

## 2014-10-10 DIAGNOSIS — G5712 Meralgia paresthetica, left lower limb: Secondary | ICD-10-CM | POA: Diagnosis not present

## 2014-10-10 DIAGNOSIS — Z0000 Encounter for general adult medical examination without abnormal findings: Secondary | ICD-10-CM | POA: Diagnosis not present

## 2014-10-10 DIAGNOSIS — I10 Essential (primary) hypertension: Secondary | ICD-10-CM | POA: Diagnosis not present

## 2014-10-10 MED ORDER — DOXAZOSIN MESYLATE 4 MG PO TABS: 4.0000 mg | ORAL_TABLET | Freq: Every day | ORAL | Status: DC

## 2014-10-10 NOTE — Assessment & Plan Note (Signed)
Resolved

## 2014-10-10 NOTE — Assessment & Plan Note (Addendum)
Losartan, Cardura - increased to 4 mg/d

## 2014-10-10 NOTE — Progress Notes (Signed)
Pre visit review using our clinic review tool, if applicable. No additional management support is needed unless otherwise documented below in the visit note. 

## 2014-10-10 NOTE — Progress Notes (Signed)
   Subjective:    Patient ID: [name], male    DOB: Jul 14, 1955, 59 y.o.   MRN: [id number]  HPI  F/u HTN, BPH - could be better F/u L thigh and numbness x long time - resolved  Review of Systems  Constitutional: Negative for chills.  HENT: Negative for ear pain and trouble swallowing.   Eyes: Negative for visual disturbance.  Gastrointestinal: Negative for blood in stool.  Genitourinary: Positive for urgency and frequency.  Skin: Negative for rash.  Psychiatric/Behavioral: Positive for agitation. Negative for suicidal ideas.   BP Readings from Last 3 Encounters:  10/10/14 122/84  09/15/13 124/76  09/10/13 140/90   Wt Readings from Last 3 Encounters:  10/10/14 204 lb (92.534 kg)  09/15/13 201 lb (91.173 kg)  09/10/13 204 lb (92.534 kg)         Objective:   Physical Exam  Constitutional: He is oriented to person, place, and time. He appears well-developed. No distress.  NAD  HENT:  Mouth/Throat: Oropharynx is clear and moist.  Eyes: Conjunctivae are normal. Pupils are equal, round, and reactive to light.  Neck: Normal range of motion. No JVD present. No thyromegaly present.  Cardiovascular: Normal rate, regular rhythm, normal heart sounds and intact distal pulses.  Exam reveals no gallop and no friction rub.   No murmur heard. Pulmonary/Chest: Effort normal and breath sounds normal. No respiratory distress. He has no wheezes. He has no rales. He exhibits no tenderness.  Abdominal: Soft. Bowel sounds are normal. He exhibits no distension and no mass. There is no tenderness. There is no rebound and no guarding.  Musculoskeletal: Normal range of motion. He exhibits no edema or tenderness.  Lymphadenopathy:    He has no cervical adenopathy.  Neurological: He is alert and oriented to person, place, and time. He has normal reflexes. No cranial nerve deficit. He exhibits normal muscle tone. He displays a negative Romberg sign. Coordination and gait normal.  Skin:  Skin is warm and dry. No rash noted.  Psychiatric: He has a normal mood and affect. His behavior is normal. Judgment and thought content normal.   Lab Results  Component Value Date   WBC 5.3 09/15/2013   HGB 13.5* 09/15/2013   HCT 41.3* 09/15/2013   PLT 234.0 05/22/2013   GLUCOSE 100* 08/15/2014   CHOL 166 08/15/2014   TRIG 135.0 08/15/2014   HDL 48.30 08/15/2014   LDLDIRECT 140.9 06/03/2011   LDLCALC 91 08/15/2014   ALT 37 08/15/2014   AST 23 08/15/2014   NA 137 08/15/2014   K 4.1 08/15/2014   CL 106 08/15/2014   CREATININE 1.13 08/15/2014   BUN 20 08/15/2014   CO2 27 08/15/2014   TSH 1.33 08/15/2014   PSA 1.06 05/22/2013          Assessment & Plan:

## 2014-10-10 NOTE — Assessment & Plan Note (Signed)
Cardura - increased to 4 mg/d

## 2014-11-18 ENCOUNTER — Other Ambulatory Visit: Payer: Self-pay | Admitting: Internal Medicine

## 2014-11-25 ENCOUNTER — Encounter: Payer: 59 | Admitting: Internal Medicine

## 2014-12-24 ENCOUNTER — Encounter: Payer: Self-pay | Admitting: Internal Medicine

## 2014-12-24 ENCOUNTER — Other Ambulatory Visit: Payer: Self-pay

## 2014-12-24 ENCOUNTER — Telehealth: Payer: Self-pay | Admitting: Internal Medicine

## 2014-12-24 MED ORDER — DOXAZOSIN MESYLATE 4 MG PO TABS: 4.0000 mg | ORAL_TABLET | Freq: Every day | ORAL | Status: DC

## 2014-12-24 NOTE — Telephone Encounter (Signed)
Patient need a  refill of  Doxazosin 4 mg send to [address] phone # [phone]  Fax # [phone]

## 2014-12-29 ENCOUNTER — Inpatient Hospital Stay (HOSPITAL_COMMUNITY)
Admission: EM | Admit: 2014-12-29 | Discharge: 2014-12-30 | DRG: 310 | Disposition: A | Discharge status: Home / Self Care | Payer: BLUE CROSS/BLUE SHIELD | Attending: Internal Medicine | Admitting: Internal Medicine

## 2014-12-29 ENCOUNTER — Emergency Department (HOSPITAL_COMMUNITY): Payer: BLUE CROSS/BLUE SHIELD

## 2014-12-29 DIAGNOSIS — I480 Paroxysmal atrial fibrillation: Secondary | ICD-10-CM | POA: Diagnosis not present

## 2014-12-29 DIAGNOSIS — Z79899 Other long term (current) drug therapy: Secondary | ICD-10-CM

## 2014-12-29 DIAGNOSIS — I471 Supraventricular tachycardia: Secondary | ICD-10-CM

## 2014-12-29 DIAGNOSIS — I472 Ventricular tachycardia, unspecified: Secondary | ICD-10-CM

## 2014-12-29 DIAGNOSIS — E785 Hyperlipidemia, unspecified: Secondary | ICD-10-CM | POA: Diagnosis present

## 2014-12-29 DIAGNOSIS — K219 Gastro-esophageal reflux disease without esophagitis: Secondary | ICD-10-CM | POA: Diagnosis present

## 2014-12-29 DIAGNOSIS — Z888 Allergy status to other drugs, medicaments and biological substances status: Secondary | ICD-10-CM

## 2014-12-29 DIAGNOSIS — Z7982 Long term (current) use of aspirin: Secondary | ICD-10-CM

## 2014-12-29 DIAGNOSIS — R002 Palpitations: Secondary | ICD-10-CM | POA: Diagnosis not present

## 2014-12-29 MED ORDER — SODIUM CHLORIDE 0.9 % IV BOLUS (SEPSIS)
1000.0000 mL | Freq: Once | INTRAVENOUS | Status: AC
  Administered 2014-12-30: 1000 mL via INTRAVENOUS

## 2014-12-29 NOTE — ED Provider Notes (Signed)
CSN: [id number]     Arrival date & time 12/29/14  2327 History   This chart was scribed for Jon Balls, MD by [name], ED Scribe. This patient was seen in room D32C/D32C and the patient's care was started 11:35 PM.   Chief Complaint  Patient presents with  . Irregular Heart Beat   The history is provided by the patient. No language interpreter was used.    HPI Comments: [name] brought in by EMS from home is a 59 y.o. male with a PMHx of hyperlipidemia and GERD who presents to the Emergency Department here for intermittent, unchanged irregular heart beat onset just prior to arrival while getting ready for work. Pt sates episode lasted for approximately 5-7 minutes. 1 dose of Mothers Nitro along with a baby ASA attempted prior to EMS arrival without any improvement for symptoms. Upon EMS arrival, pt was in [place], however, when pt stood up and got onto stretcher, he converted back into sinus rhythm. He denies any chest pain or shortness of breath. No recent fever, chills, nausea, vomiting, shortness of breath, or abdominal pain. [name] admits to a previous episode approximately 10 years ago. Pt with known allergies to Atorvastatin and Flomax.  Past Medical History  Diagnosis Date  . Hyperlipidemia   . GERD (gastroesophageal reflux disease)   . Headache(784.0)   . URI (upper respiratory infection)    Past Surgical History  Procedure Laterality Date  . Inguinal hernia repair     Family History  Problem Relation Age of Onset  . Coronary artery disease Father   . Diabetes Father   . Heart disease Father   . Liver disease Mother   . Kidney disease Mother   . Heart disease Mother   . Coronary artery disease Mother   . Colon cancer Neg Hx    History  Substance Use Topics  . Smoking status: Never Smoker   . Smokeless tobacco: Never Used  . Alcohol Use: No     Comment: rare    Review of Systems  Constitutional: Negative for fever and chills.  Respiratory:  Negative for cough and shortness of breath.   Cardiovascular: Negative for chest pain.  Gastrointestinal: Negative for nausea, vomiting, abdominal pain and diarrhea.  Neurological: Negative for dizziness, weakness and numbness.  Psychiatric/Behavioral: Negative for confusion.  All other systems reviewed and are negative.     Allergies  Atorvastatin and Flomax  Home Medications   Prior to Admission medications   Medication Sig Start Date End Date Taking? Authorizing Provider  acetaminophen (TYLENOL) 500 MG tablet Take 1,000 mg by mouth every 6 (six) hours as needed for pain.    Historical Provider, MD  aspirin EC 81 MG tablet Take 81 mg by mouth daily.    Historical Provider, MD  atorvastatin (LIPITOR) 40 MG tablet Take 20 mg by mouth once a week.    Historical Provider, MD  doxazosin (CARDURA) 4 MG tablet Take 1 tablet (4 mg total) by mouth daily. 12/24/14   Jon Plotnikov V, MD  losartan (COZAAR) 100 MG tablet TAKE 1 TABLET BY MOUTH EVERY DAY 10/03/14   Jon Plotnikov V, MD  losartan (COZAAR) 100 MG tablet TAKE 1 TABLET BY MOUTH EVERY DAY 11/19/14   Jon Plotnikov V, MD  Omega-3 Fatty Acids (FISH OIL PO) Take 1 capsule by mouth daily.    Historical Provider, MD   Triage Vitals: BP 109/61 mmHg  Temp(Src) 97.8 F (36.6 C) (Oral)  Resp 20  Ht 5'  9" (1.753 m)  Wt 200 lb (90.719 kg)  BMI 29.52 kg/m2  SpO2 92%   Physical Exam  Constitutional: He is oriented to person, place, and time. Vital signs are normal. He appears well-developed and well-nourished.  Non-toxic appearance. He does not appear ill. No distress.  HENT:  Head: Normocephalic and atraumatic.  Nose: Nose normal.  Mouth/Throat: Oropharynx is clear and moist. No oropharyngeal exudate.  Eyes: Conjunctivae and EOM are normal. Pupils are equal, round, and reactive to light. No scleral icterus.  Neck: Normal range of motion. Neck supple. No tracheal deviation, no edema, no erythema and normal range of motion present.  No thyroid mass and no thyromegaly present.  Cardiovascular: Normal rate, regular rhythm, S1 normal, S2 normal, normal heart sounds, intact distal pulses and normal pulses.  Exam reveals no gallop and no friction rub.   No murmur heard. Pulses:      Radial pulses are 2+ on the right side, and 2+ on the left side.       Dorsalis pedis pulses are 2+ on the right side, and 2+ on the left side.  Pulmonary/Chest: Effort normal and breath sounds normal. No respiratory distress. He has no wheezes. He has no rhonchi. He has no rales.  Abdominal: Soft. Normal appearance and bowel sounds are normal. He exhibits no distension, no ascites and no mass. There is no hepatosplenomegaly. There is no tenderness. There is no rebound, no guarding and no CVA tenderness.  Musculoskeletal: Normal range of motion. He exhibits no edema or tenderness.  Lymphadenopathy:    He has no cervical adenopathy.  Neurological: He is alert and oriented to person, place, and time. He has normal strength. No cranial nerve deficit or sensory deficit.  Skin: Skin is warm, dry and intact. No petechiae and no rash noted. He is not diaphoretic. No erythema. No pallor.  Psychiatric: He has a normal mood and affect. His behavior is normal. Judgment normal.  Nursing note and vitals reviewed.   ED Course  Procedures (including critical care time)  DIAGNOSTIC STUDIES: Oxygen Saturation is 92% on RA, low by my interpretation.    COORDINATION OF CARE: 11:35 PM- Will order EKG, CXR, istat troponin, BMP, CBC, i-stat troponin, ethanol, magnesium, BMP. Discussed treatment plan with pt at bedside and pt agreed to plan.     Labs Review Labs Reviewed  BASIC METABOLIC PANEL - Abnormal; Notable for the following:    Glucose, Bld 113 (*)    Calcium 8.6 (*)    All other components within normal limits  CBC - Abnormal; Notable for the following:    MCHC 36.2 (*)    All other components within normal limits  ETHANOL  MAGNESIUM  URINE RAPID  DRUG SCREEN, HOSP PERFORMED  I-STAT TROPOININ, ED  Randolm Idol, ED    Imaging Review Dg Chest 2 View  12/30/2014   CLINICAL DATA:  59 year old male with tachycardia.  EXAM: CHEST  2 VIEW  COMPARISON:  Chest radiograph dated 09/14/2013  FINDINGS: The heart size and mediastinal contours are within normal limits. Both lungs are clear. The visualized skeletal structures are unremarkable.  IMPRESSION: No active cardiopulmonary disease.   Electronically Signed   By: Anner Crete M.D.   On: 12/30/2014 00:20     EKG Interpretation   Date/Time:  Sunday December 29 2014 23:38:19 EDT Ventricular Rate:  72 PR Interval:  194 QRS Duration: 104 QT Interval:  382 QTC Calculation: 418 R Axis:   -7 Text Interpretation:  Sinus rhythm No  significant change since last  tracing Confirmed by Glynn Octave [phone]) on 12/29/2014 11:55:22  PM      MDM   Final diagnoses:  None   Patient since emergency department due to arrhythmia and lightheadedness. Rhythm strip shows possible ventricular tachycardia. He spontaneously converted prior to arrival. [facility] obtain workup to uncover the etiology of this including chest x-ray, electrolytes, alcohol and drug screen. We'll consult with cardiology for outpatient recommendations. Currently patient is stable but with new oxygen requirement of 2 L nasal cannula. His room air saturation is 89%.  I spoke with Dr. Oval Linsey who has evaluated the EKG and believes it is a fib with aberrancy vs Benson tach.  I also have concern for Benson tach.  Cardiology will admit for further diagnostics.  ED workup is overall negative.  He appears otherwise well aside from new O2 requirement.  I personally performed the services described in this documentation, which was scribed in my presence. The recorded information has been reviewed and is accurate.   Jon Balls, MD 12/30/14 [phone]

## 2014-12-29 NOTE — ED Notes (Signed)
Dr. Claudine Mouton at bedside upon pt's arrival.

## 2014-12-29 NOTE — ED Notes (Signed)
PER EMS: pt from home, reported he felt his heart beating very fast while getting ready for work so he took 324mg  of asa and took 1 of his mother's nitro SL tablet. EMS arrived and noticed pt to be inA&OX4 and in vtach, HR-119, but when pt stood up and got onto stretcher he had converted to sinus rhythm. BP-106/56, HR-70, denies Cp, SOB.

## 2014-12-30 ENCOUNTER — Encounter (HOSPITAL_COMMUNITY): Payer: Self-pay

## 2014-12-30 ENCOUNTER — Inpatient Hospital Stay (HOSPITAL_COMMUNITY): Payer: BLUE CROSS/BLUE SHIELD

## 2014-12-30 ENCOUNTER — Emergency Department (HOSPITAL_COMMUNITY): Payer: BLUE CROSS/BLUE SHIELD

## 2014-12-30 DIAGNOSIS — I472 Ventricular tachycardia, unspecified: Secondary | ICD-10-CM

## 2014-12-30 DIAGNOSIS — Z888 Allergy status to other drugs, medicaments and biological substances status: Secondary | ICD-10-CM | POA: Diagnosis not present

## 2014-12-30 DIAGNOSIS — Z7982 Long term (current) use of aspirin: Secondary | ICD-10-CM | POA: Diagnosis not present

## 2014-12-30 DIAGNOSIS — R002 Palpitations: Secondary | ICD-10-CM | POA: Diagnosis present

## 2014-12-30 DIAGNOSIS — I4891 Unspecified atrial fibrillation: Secondary | ICD-10-CM

## 2014-12-30 DIAGNOSIS — Z79899 Other long term (current) drug therapy: Secondary | ICD-10-CM | POA: Diagnosis not present

## 2014-12-30 DIAGNOSIS — I480 Paroxysmal atrial fibrillation: Secondary | ICD-10-CM | POA: Diagnosis present

## 2014-12-30 DIAGNOSIS — K219 Gastro-esophageal reflux disease without esophagitis: Secondary | ICD-10-CM | POA: Diagnosis present

## 2014-12-30 DIAGNOSIS — E785 Hyperlipidemia, unspecified: Secondary | ICD-10-CM | POA: Diagnosis present

## 2014-12-30 LAB — CBC
HEMATOCRIT: 39.9 % (ref 39.0–52.0)
HEMATOCRIT: 40.3 % (ref 39.0–52.0)
Hemoglobin: 14 g/dL (ref 13.0–17.0)
Hemoglobin: 14.6 g/dL (ref 13.0–17.0)
MCH: 30.9 pg (ref 26.0–34.0)
MCH: 31.3 pg (ref 26.0–34.0)
MCHC: 35.1 g/dL (ref 30.0–36.0)
MCHC: 36.2 g/dL — ABNORMAL HIGH (ref 30.0–36.0)
MCV: 86.5 fL (ref 78.0–100.0)
MCV: 88.1 fL (ref 78.0–100.0)
PLATELETS: 207 10*3/uL (ref 150–400)
Platelets: 197 10*3/uL (ref 150–400)
RBC: 4.53 MIL/uL (ref 4.22–5.81)
RBC: 4.66 MIL/uL (ref 4.22–5.81)
RDW: 12.7 % (ref 11.5–15.5)
RDW: 12.8 % (ref 11.5–15.5)
WBC: 6.4 10*3/uL (ref 4.0–10.5)
WBC: 7 10*3/uL (ref 4.0–10.5)

## 2014-12-30 LAB — RAPID URINE DRUG SCREEN, HOSP PERFORMED
Amphetamines: NOT DETECTED
Barbiturates: NOT DETECTED
Benzodiazepines: NOT DETECTED
Cocaine: NOT DETECTED
Opiates: NOT DETECTED
Tetrahydrocannabinol: NOT DETECTED

## 2014-12-30 LAB — BASIC METABOLIC PANEL
Anion gap: 6 (ref 5–15)
BUN: 19 mg/dL (ref 6–20)
CALCIUM: 8.6 mg/dL — AB (ref 8.9–10.3)
CO2: 24 mmol/L (ref 22–32)
CREATININE: 1.18 mg/dL (ref 0.61–1.24)
Chloride: 107 mmol/L (ref 101–111)
GFR calc non Af Amer: >60 mL/min (ref >60)
Glucose, Bld: 113 mg/dL — ABNORMAL HIGH (ref 65–99)
POTASSIUM: 3.8 mmol/L (ref 3.5–5.1)
Sodium: 137 mmol/L (ref 135–145)

## 2014-12-30 LAB — CREATININE, SERUM
Creatinine, Ser: 1.12 mg/dL (ref 0.61–1.24)
GFR calc Af Amer: >60 mL/min (ref >60)
GFR calc non Af Amer: >60 mL/min (ref >60)

## 2014-12-30 LAB — LIPID PANEL
Cholesterol: 166 mg/dL (ref 0–200)
HDL: 40 mg/dL — ABNORMAL LOW (ref >40)
LDL Cholesterol: 87 mg/dL (ref 0–99)
Total CHOL/HDL Ratio: 4.2 RATIO
Triglycerides: 195 mg/dL — ABNORMAL HIGH (ref <150)
VLDL: 39 mg/dL (ref 0–40)

## 2014-12-30 LAB — TSH: TSH: 1.266 u[IU]/mL (ref 0.350–4.500)

## 2014-12-30 LAB — MAGNESIUM
Magnesium: 2 mg/dL (ref 1.7–2.4)
Magnesium: 2.1 mg/dL (ref 1.7–2.4)

## 2014-12-30 LAB — TROPONIN I: Troponin I: <0.03 ng/mL (ref <0.031)

## 2014-12-30 LAB — I-STAT TROPONIN, ED: TROPONIN I, POC: 0 ng/mL (ref 0.00–0.08)

## 2014-12-30 LAB — T4, FREE: Free T4: 0.78 ng/dL (ref 0.61–1.12)

## 2014-12-30 LAB — ETHANOL: Alcohol, Ethyl (B): <5 mg/dL (ref <5)

## 2014-12-30 MED ORDER — METOPROLOL TARTRATE 25 MG PO TABS: 12.5000 mg | ORAL_TABLET | Freq: Two times a day (BID) | ORAL | Status: DC

## 2014-12-30 MED ORDER — ACETAMINOPHEN 325 MG PO TABS: 650.0000 mg | ORAL_TABLET | ORAL | Status: DC | PRN

## 2014-12-30 MED ORDER — ASPIRIN EC 81 MG PO TBEC: 81.0000 mg | DELAYED_RELEASE_TABLET | Freq: Every day | ORAL | Status: DC

## 2014-12-30 MED ORDER — ONDANSETRON HCL 4 MG/2ML IJ SOLN: 4.0000 mg | Freq: Four times a day (QID) | INTRAMUSCULAR | Status: DC | PRN

## 2014-12-30 MED ORDER — ENOXAPARIN SODIUM 40 MG/0.4ML ~~LOC~~ SOLN
40.0000 mg | SUBCUTANEOUS | Status: DC
Start: 2014-12-30 — End: 2014-12-30
  Filled 2014-12-30: qty 0.4

## 2014-12-30 MED ORDER — NITROGLYCERIN 0.4 MG SL SUBL: 0.4000 mg | SUBLINGUAL_TABLET | SUBLINGUAL | Status: DC | PRN

## 2014-12-30 NOTE — Progress Notes (Addendum)
Interval coverage note:  59 yo male with no past cardiac history present with episode of symptomatic VT (vs less likely a-fib with aberrancy) that last 10 min. On EMS arrival, he was noted to be in VT with HR 150s. Has been in NSR in the ED since. He had a negative myoview 07/25/2013 EF 53%, NL LV function. Admitted to cardiology service, trop negative x1 so far. Denies any CP.  Subjective:  No CP overnight. Did not feel good yesterday during the episode. No SOB  Physical Exam: Heart: Bradycardic, no murmur Lung: CTA General: NAD HEENT: normal Extremity: no edema  Plan:  1. Sympatomatic VT vs a-fib with aberrancy:   - continue to trend enzyme, echocardiogram  - will discuss with MD regarding ischemic workup, likely will need EP consult  2. Baseline bradycardia: NSR HR 40-50s, has been having HR in high 30s. Unable to give BB for VT.  Hilbert Corrigan PA Pager: [phone]

## 2014-12-30 NOTE — ED Notes (Signed)
Pt was sleeping and HR decreased to 36 beats per minute, witnessed by Marylyn Ishihara, EMT. New EKG printed and shown to Dr. Claudine Mouton. No heart blocks noted. Pt is not symptomatic. Dr. Oval Linsey with cardiology informed and aware. Pt in no acute distress. Skin warm, dry, within normal limits.

## 2014-12-30 NOTE — Discharge Summary (Signed)
Discharge Summary   Patient ID: [name],  MRN: [id number], DOB/AGE: 57/07/1955 57 y.o.  Admit date: 12/29/2014 Discharge date: 12/30/2014  Primary Care Provider: Walker Kehr Primary Cardiologist: new - Dr. Jilda Panda  Discharge Diagnoses Active Problems:   Atrial fibrillation with rapid ventricular response   Allergies Allergies  Allergen Reactions  . Atorvastatin     REACTION: leg cramps per patient with a large dose  . Flomax [Tamsulosin Hcl]     Hard time breathing   Hospital Course  The patient is a 59 year old male is no past cardiac history who presented to [facility] on 12/29/2014 after he started having palpitation. The episode lasted at least 10 minutes. He did take high-dose aspirin and one of his mother's nitroglycerin. On EMS arrival, he was noted to be in wide complex tachycardia with heart rate of 150s. When he tried to stand up, the rhythm broke and he returned to normal sinus rhythm. He immediately felt better afterward. He had a negative Myoview with EF 53% in 07/25/2013.  He was admitted to cardiology service, overnight, he was in sinus bradycardia with heart rate in the 40s to 50s. With lowest heart rate in the high 30s. We were unable to add any beta blocker due to his baseline bradycardia. He was seen in the morning of 12/30/2014, at which time he he was doing well without any significant discomfort or shortness of breath. His troponin has been negative 2. Lipid panel does show cholesterol 166, triglyceride 195, HDL 40, LDL 87. Statin was not started given his history of myalgia. TSH and free T4 were negative. The EKG has been reviewed with Dr. Lovena Le of the electrophysiology service, who felt the wide complex tachycardia was in fact atrial fibrillation with aberrancy. He is deemed stable for discharge from cardiology perspective. Given his low CHA2DS2-Vasc score of 1 (HTN <-- although he does not have a formal diagnosis of HTN, he is on  losartan at home), he has been placed on low-dose aspirin only. I will arrange outpatient follow-up with Dr. Lovena Le.   Discharge Vitals Blood pressure 116/78, pulse 45, temperature 97.9 F (36.6 C), temperature source Oral, resp. rate 12, height 5\' 9"  (1.753 m), weight 200 lb (90.719 kg), SpO2 96 %.  Filed Weights   12/29/14 2338  Weight: 200 lb (90.719 kg)    Labs  CBC  Recent Labs  12/29/14 2350 12/30/14 0217  WBC 6.4 7.0  HGB 14.6 14.0  HCT 40.3 39.9  MCV 86.5 88.1  PLT 207 063   Basic Metabolic Panel  Recent Labs  12/29/14 2350 12/29/14 2351 12/30/14 0217  NA 137  --   --   K 3.8  --   --   CL 107  --   --   CO2 24  --   --   GLUCOSE 113*  --   --   BUN 19  --   --   CREATININE 1.18  --  1.12  CALCIUM 8.6*  --   --   MG  --  2.0 2.1   Cardiac Enzymes  Recent Labs  12/30/14 0217  TROPONINI <0.03   Fasting Lipid Panel  Recent Labs  12/30/14 0217  CHOL 166  HDL 40*  LDLCALC 87  TRIG 195*  CHOLHDL 4.2   Thyroid Function Tests  Recent Labs  12/30/14 0217  TSH 1.266    Disposition  Pt is being discharged home today in good condition.  Follow-up Plans & Appointments  Follow-up Information    Follow up with Cristopher Peru, MD On 01/08/2015.   Specialty:  Cardiology   Why:  4:00pm   Contact information:   [address] [address] [phone]       Discharge Medications    Medication List    TAKE these medications        acetaminophen 500 MG tablet  Commonly known as:  TYLENOL  Take 1,000 mg by mouth every 6 (six) hours as needed for pain.     aspirin EC 81 MG tablet  Take 81 mg by mouth daily.     doxazosin 4 MG tablet  Commonly known as:  CARDURA  Take 1 tablet (4 mg total) by mouth daily.     FISH OIL PO  Take 1 capsule by mouth daily.     losartan 100 MG tablet  Commonly known as:  COZAAR  TAKE 1 TABLET BY MOUTH EVERY DAY        Duration of Discharge Encounter   Greater  than 30 minutes including physician time.  Hilbert Corrigan PA-C Pager: [phone] 12/30/2014, 1:35 PM   Attending Note:   The patient was seen and examined.  Agree with assessment and plan as noted above.  Changes made to the above note as needed.  Pt has paroxysmal atrial fib with aberrant conduction . ECG was reviewed by Dr. Lovena Le  Will follow up with EP    Ramond Dial., MD, [facility] 12/30/2014, 5:24 PM [address]  [place] Pager [phone]

## 2014-12-30 NOTE — Progress Notes (Signed)
PROGRESS NOTE  Subjective:   59 yo admitted with HR irregularity  ECG shows atrial fib with aberrant conduction  He is stable. troponins are negatvie He had a similar episode last year. A stress Myoview study performed at that time revealed no ischemia. He's not had any further episodes of atrial fibrillation.  His heart rate is fairly slow at baseline. It'll be difficult at any beta blockers or calcium channel blockers at this time. He's feeling completely normal now. He wants to go home.  Objective:    Vital Signs:   Temp:  [97.5 F (36.4 C)-97.9 F (36.6 C)] 97.9 F (36.6 C) (07/11 1217) Pulse Rate:  [43-71] 50 (07/11 1200) Resp:  [6-24] 18 (07/11 1200) BP: (104-130)/(61-84) 119/78 mmHg (07/11 1200) SpO2:  [89 %-99 %] 95 % (07/11 1200) Weight:  [90.719 kg (200 lb)] 90.719 kg (200 lb) (07/10 2338)      24-hour weight change: Weight change:   Weight trends: Filed Weights   12/29/14 2338  Weight: 90.719 kg (200 lb)    Intake/Output:  07/10 0701 - 07/11 0700 In: 1000 [I.V.:1000] Out: 100 [Urine:100]     Physical Exam: BP 119/78 mmHg  Pulse 50  Temp(Src) 97.9 F (36.6 C) (Oral)  Resp 18  Ht 5\' 9"  (1.753 m)  Wt 90.719 kg (200 lb)  BMI 29.52 kg/m2  SpO2 95%  Wt Readings from Last 3 Encounters:  12/29/14 90.719 kg (200 lb)  10/10/14 92.534 kg (204 lb)  09/15/13 91.173 kg (201 lb)    General: Vital signs reviewed and noted.   Head: Normocephalic, atraumatic.  Eyes: conjunctivae/corneas clear.  EOM's intact.   Throat: normal  Neck:  normal   Lungs:    clear   Heart:  RR  Abdomen:  Soft, non-tender, non-distended    Extremities: No edema , no clubbing    Neurologic: A&O X3, CN II - XII are grossly intact.   Psych: Normal     Labs: BMET:  Recent Labs  12/29/14 2350 12/29/14 2351 12/30/14 0217  NA 137  --   --   K 3.8  --   --   CL 107  --   --   CO2 24  --   --   GLUCOSE 113*  --   --   BUN 19  --   --   CREATININE 1.18  --  1.12   CALCIUM 8.6*  --   --   MG  --  2.0 2.1    Liver function tests: No results for input(s): AST, ALT, ALKPHOS, BILITOT, PROT, ALBUMIN in the last 72 hours. No results for input(s): LIPASE, AMYLASE in the last 72 hours.  CBC:  Recent Labs  12/29/14 2350 12/30/14 0217  WBC 6.4 7.0  HGB 14.6 14.0  HCT 40.3 39.9  MCV 86.5 88.1  PLT 207 197    Cardiac Enzymes:  Recent Labs  12/30/14 0217  TROPONINI <0.03    Coagulation Studies: No results for input(s): LABPROT, INR in the last 72 hours.  Other: Invalid input(s): POCBNP No results for input(s): DDIMER in the last 72 hours. No results for input(s): HGBA1C in the last 72 hours.  Recent Labs  12/30/14 0217  CHOL 166  HDL 40*  LDLCALC 87  TRIG 195*  CHOLHDL 4.2    Recent Labs  12/30/14 0217  TSH 1.266   No results for input(s): VITAMINB12, FOLATE, FERRITIN, TIBC, IRON, RETICCTPCT in the last 72 hours.   Other results:  EKG  ( personally reviewed )  -sinus brady   Medications:    Infusions:    Scheduled Medications: . [START ON 12/31/2014] aspirin EC  81 mg Oral Daily  . enoxaparin (LOVENOX) injection  40 mg Subcutaneous Q24H    Assessment/ Plan:   Active Problems:   Ventricular tachycardia  1. Atrial fibrillation  with rapid ventricular response: Patient presents with paroxysmal atrial fibrillation with a rapid rate ventricular response. He has sinus bradycardia at baseline. It will be difficult to start him on a beta blocker or calcium channel blocker.  His CHADS2 VASC score is 0 and the only has very brief episodes of atrial fibrillation.  I think that we could start him on ASA 81 mg a day   Will have him follow up with EP .   DC to home today    Disposition:  Length of Stay: 0  Thayer Headings, Brooke Bonito., MD, [facility] 12/30/2014, 12:31 PM Office [phone] Pager [phone]

## 2014-12-30 NOTE — ED Notes (Signed)
Meal tray ordered and zoll at bedside due to initial HR with EMS and bradycardia.

## 2014-12-30 NOTE — ED Notes (Signed)
Charge RN on 2W updated on plan for possible discharge once Dr. Lovena Le reviews EKG.

## 2014-12-30 NOTE — ED Notes (Addendum)
Dr. Claudine Mouton at bedside with this RN and patient.  Patients heart rate was 37, patient is denying chest pain, is alert and oriented.

## 2014-12-30 NOTE — H&P (Signed)
HPI: [name] is a 59M with no known past medical history who presents with an episode of symptomatic VT.  This evening while resting he had an episode of palpitations that lasted for at least 10 minutes.  After five minutes he took a full strength aspirin and one of his mother's nitroglycerin.  The palpitations persisted, so he called EMS.  Per his mother, he looked quite ill, though he denies lightheadedness, CP, SOB or diaphoresis.  When EMS arrived he was found to be in VT with a rate of approximately 150bpm.  When he tried to stand up, the rhythm broke and he returned to NSR.  He immediately felt better.  [name] reports a similar episode of palpitations approximately 10 years ago, though much less severe.  He did not undergo cardiac evaluation.  He reportedly had a negative stress test a few years ago.  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  He it typically able to complete his duties, but does report occasional chest discomfort.  His mother reports that he sometimes asks her for her nitroglycerin pills when walking around.  He has a strong family history of CAD, with his mom undergoing CABG at 61, maternal grandfather and uncles all had MIs in their early to mid 8s.   Review of Systems:     Cardiac Review of Systems: {Y] = yes [ ]  = no  Chest Pain [ y   ]  Resting SOB [   ] Exertional SOB  [  ]  Orthopnea [  ]   Pedal Edema [   ]    Palpitations [y] Syncope  [  ]   Presyncope [   ]  General Review of Systems: [Y] = yes [  ]=no Constitional: recent weight change [  ]; anorexia [  ]; fatigue [  ]; nausea [  ]; night sweats [  ]; fever [  ]; or chills [  ];                                                                                                                                          Dental: poor dentition[  ];   Eye : blurred vision [  ]; diplopia [   ]; vision changes [  ];  Amaurosis fugax[  ]; Resp: cough [  ];  wheezing[  ];  hemoptysis[  ]; shortness of  breath[  ]; paroxysmal nocturnal dyspnea[  ]; dyspnea on exertion[  ]; or orthopnea[  ];  GI:  gallstones[  ], vomiting[  ];  dysphagia[  ]; melena[  ];  hematochezia [  ]; heartburn[  ];   Hx of  Colonoscopy[  ]; GU: kidney stones [  ]; hematuria[  ];   dysuria [  ];  nocturia[  ];  history of     obstruction [  ];  Skin: rash, swelling[  ];, hair loss[  ];  peripheral edema[  ];  or itching[  ]; Musculosketetal: myalgias[  ];  joint swelling[  ];  joint erythema[  ];  joint pain[  ];  back pain[  ];  Heme/Lymph: bruising[  ];  bleeding[  ];  anemia[  ];  Neuro: TIA[  ];  headaches[  ];  stroke[  ];  vertigo[  ];  seizures[  ];   paresthesias[  ];  difficulty walking[  ];  Psych:depression[  ]; anxiety[  ];  Endocrine: diabetes[  ];  thyroid dysfunction[  ];  Immunizations: Flu [  ]; Pneumococcal[  ];  Other:  Past Medical History  Diagnosis Date  . Hyperlipidemia   . GERD (gastroesophageal reflux disease)   . Headache(784.0)   . URI (upper respiratory infection)      (Not in a hospital admission)   Allergies  Allergen Reactions  . Atorvastatin     REACTION: leg cramps per patient with a large dose  . Flomax [Tamsulosin Hcl]     Hard time breathing    History   Social History  . Marital Status: Divorced    Spouse Name: N/A  . Number of Children: 1  . Years of Education: N/A   Occupational History  . Animal nutritionist    Social History Main Topics  . Smoking status: Never Smoker   . Smokeless tobacco: Never Used  . Alcohol Use: No     Comment: rare  . Drug Use: No  . Sexual Activity: No   Other Topics Concern  . Not on file   Social History Narrative   Coffee daily     Family History  Problem Relation Age of Onset  . Coronary artery disease Father   . Diabetes Father   . Heart disease Father   . Liver disease Mother   . Kidney disease Mother   . Heart disease Mother   . Coronary artery disease Mother   . Colon cancer Neg Hx      PHYSICAL EXAM: Filed Vitals:   12/30/14 0145  BP: 113/74  Pulse: 51  Temp:   Resp: 15   General:  Well appearing. No respiratory difficulty HEENT: normal Neck: supple. no JVD. Carotids 2+ bilat; no bruits. No lymphadenopathy or thryomegaly appreciated. Cor: PMI nondisplaced. Regular rate & rhythm. No rubs, gallops or murmurs. Lungs: clear Abdomen: soft, nontender, nondistended. No hepatosplenomegaly. No bruits or masses. Good bowel sounds. Extremities: no cyanosis, clubbing, rash, edema Neuro: alert & oriented x 3, cranial nerves grossly intact. moves all 4 extremities w/o difficulty. Affect pleasant.  ECG: Sinus bradycardia at 50bpm EMS tracing: VT at 150bpm.  One capture beat noted and possible A-V disassociation.  VT is irregular at times.   Results for orders placed or performed during the hospital encounter of 12/29/14 (from the past 24 hour(s))  Basic metabolic panel     Status: Abnormal   Collection Time: 12/29/14 11:50 PM  Result Value Ref Range   Sodium 137 135 - 145 mmol/L   Potassium 3.8 3.5 - 5.1 mmol/L   Chloride 107 101 - 111 mmol/L   CO2 24 22 - 32 mmol/L   Glucose, Bld 113 (H) 65 - 99 mg/dL   BUN 19 6 - 20 mg/dL   Creatinine, Ser 1.18 0.61 - 1.24 mg/dL   Calcium 8.6 (L) 8.9 - 10.3 mg/dL   GFR calc non Af Amer >60 >60 mL/min   GFR calc Af Amer >60 >  60 mL/min   Anion gap 6 5 - 15  CBC     Status: Abnormal   Collection Time: 12/29/14 11:50 PM  Result Value Ref Range   WBC 6.4 4.0 - 10.5 K/uL   RBC 4.66 4.22 - 5.81 MIL/uL   Hemoglobin 14.6 13.0 - 17.0 g/dL   HCT 40.3 39.0 - 52.0 %   MCV 86.5 78.0 - 100.0 fL   MCH 31.3 26.0 - 34.0 pg   MCHC 36.2 (H) 30.0 - 36.0 g/dL   RDW 12.7 11.5 - 15.5 %   Platelets 207 150 - 400 K/uL  Ethanol     Status: None   Collection Time: 12/29/14 11:51 PM  Result Value Ref Range   Alcohol, Ethyl (B) <5 <5 mg/dL  Magnesium     Status: None   Collection Time: 12/29/14 11:51 PM  Result Value Ref Range   Magnesium 2.0 1.7  - 2.4 mg/dL  I-stat troponin, ED  (not at [facility], [facility])     Status: None   Collection Time: 12/29/14 11:52 PM  Result Value Ref Range   Troponin i, poc 0.00 0.00 - 0.08 ng/mL   Comment 3          Urine rapid drug screen (hosp performed)     Status: None   Collection Time: 12/30/14  1:04 AM  Result Value Ref Range   Opiates NONE DETECTED NONE DETECTED   Cocaine NONE DETECTED NONE DETECTED   Benzodiazepines NONE DETECTED NONE DETECTED   Amphetamines NONE DETECTED NONE DETECTED   Tetrahydrocannabinol NONE DETECTED NONE DETECTED   Barbiturates NONE DETECTED NONE DETECTED   Dg Chest 2 View  12/30/2014   CLINICAL DATA:  59 year old male with tachycardia.  EXAM: CHEST  2 VIEW  COMPARISON:  Chest radiograph dated 09/14/2013  FINDINGS: The heart size and mediastinal contours are within normal limits. Both lungs are clear. The visualized skeletal structures are unremarkable.  IMPRESSION: No active cardiopulmonary disease.   Electronically Signed   By: Anner Crete M.D.   On: 12/30/2014 00:20    ASSESSMENT: 57M with no known past medical history who presents with an episode of symptomatic VT. The ECG is likely VT, especially given the capture beat.  However, the irregularity makes atrial fibrillation with aberrancy also a less likely possibility.  Given his extensive family history of CAD and what seems to be ischemic symptoms that he has been minimizing, ischemic VT is the most likely etiology.   PLAN/DISCUSSION: - Repeat cardiac enzymes - check TSH - Telemetry - TTE - stress vs. Cath - Maintain K>4, Mg >2 - ASA 81mg  po daily - No statin for now unless ischemia is confirmed given his history of myalgias.  Check lipid profile - No beta blocker 2/2 bradycardia

## 2014-12-30 NOTE — ED Notes (Signed)
Cardiology pa at bedside

## 2014-12-30 NOTE — Discharge Instructions (Signed)
Bradycardia °Bradycardia is a term for a heart rate (pulse) that, in adults, is slower than 60 beats per minute. A normal rate is 60 to 100 beats per minute. A heart rate below 60 beats per minute may be normal for some adults with healthy hearts. If the rate is too slow, the heart may have trouble pumping the volume of blood the body needs. If the heart rate gets too low, blood flow to the brain may be decreased and may make you feel lightheaded, dizzy, or faint. °The heart has a natural pacemaker in the top of the heart called the SA node (sinoatrial or sinus node). This pacemaker sends out regular electrical signals to the muscle of the heart, telling the heart muscle when to beat (contract). The electrical signal travels from the upper parts of the heart (atria) through the AV node (atrioventricular node), to the lower chambers of the heart (ventricles). The ventricles squeeze, pumping the blood from your heart to your lungs and to the rest of your body. °CAUSES  °· Problem with the heart's electrical system. °· Problem with the heart's natural pacemaker. °· Heart disease, damage, or infection. °· Medications. °· Problems with minerals and salts (electrolytes). °SYMPTOMS  °· Fainting (syncope). °· Fatigue and weakness. °· Shortness of breath (dyspnea). °· Chest pain (angina). °· Drowsiness. °· Confusion. °DIAGNOSIS  °· An electrocardiogram (ECG) can help your caregiver determine the type of slow heart rate you have. °· If the cause is not seen on an ECG, you may need to wear a heart monitor that records your heart rhythm for several hours or days. °· Blood tests. °TREATMENT  °· Electrolyte supplements. °· Medications. °· Withholding medication which is causing a slow heart rate. °· Pacemaker placement. °SEEK IMMEDIATE MEDICAL CARE IF:  °· You feel lightheaded or faint. °· You develop an irregular heart rate. °· You feel chest pain or have trouble breathing. °MAKE SURE YOU:  °· Understand these  instructions. °· Will watch your condition. °· Will get help right away if you are not doing well or get worse. °Document Released: 02/27/2002 Document Revised: 08/30/2011 Document Reviewed: 09/12/2013 °Exit[employer]® Patient Information ©2015 [employer]. This information is not intended to replace advice given to you by your health care provider. Make sure you discuss any questions you have with your health care provider. ° °

## 2014-12-30 NOTE — ED Notes (Signed)
Pts HR decreased to 47 beats per minute; new EKG printed and shown to Dr. Claudine Mouton as well as the admitting physicians.

## 2014-12-30 NOTE — ED Notes (Signed)
Pt ambulating in room NAD noted. Pt informed of delay in room assignment. Informed that all tests and procedures will be completed regardless of room in ED or floor.

## 2014-12-30 NOTE — ED Notes (Signed)
Patients family would like to be notified when patient gets a bed upstairs. If they do not answer, leave a message. Patients sonCristie Hem # [phone]

## 2014-12-30 NOTE — ED Notes (Signed)
Cardiology MD at bedside.

## 2014-12-30 NOTE — ED Notes (Signed)
The patient was able to walk to the restroom on his own.  He refused a wheel chair.

## 2014-12-31 LAB — HEMOGLOBIN A1C
Hgb A1c MFr Bld: 5.7 % — ABNORMAL HIGH (ref 4.8–5.6)
Mean Plasma Glucose: 117 mg/dL

## 2015-01-08 ENCOUNTER — Institutional Professional Consult (permissible substitution): Payer: 59 | Admitting: Internal Medicine

## 2015-01-10 ENCOUNTER — Encounter: Payer: 59 | Admitting: Internal Medicine

## 2015-01-21 ENCOUNTER — Ambulatory Visit (INDEPENDENT_AMBULATORY_CARE_PROVIDER_SITE_OTHER): Payer: BLUE CROSS/BLUE SHIELD | Admitting: Internal Medicine

## 2015-01-21 ENCOUNTER — Encounter: Payer: Self-pay | Admitting: Internal Medicine

## 2015-01-21 VITALS — BP 114/74 | HR 74 | Ht 69.0 in | Wt 204.2 lb

## 2015-01-21 DIAGNOSIS — I4891 Unspecified atrial fibrillation: Secondary | ICD-10-CM

## 2015-01-21 DIAGNOSIS — I471 Supraventricular tachycardia: Secondary | ICD-10-CM | POA: Diagnosis not present

## 2015-01-21 DIAGNOSIS — I10 Essential (primary) hypertension: Secondary | ICD-10-CM | POA: Diagnosis not present

## 2015-01-21 MED ORDER — FLECAINIDE ACETATE 100 MG PO TABS: 100.0000 mg | ORAL_TABLET | ORAL | Status: DC | PRN

## 2015-01-21 NOTE — Assessment & Plan Note (Signed)
He has very infrequent episodes of atrial fib. I have recommended he continue ASA. He will be given flecainide to be taken as a pill in the pocket. I will see him back in 6 months.

## 2015-01-21 NOTE — Assessment & Plan Note (Signed)
His blood pressure is well controlled on Losartan. Will follow. He is encouraged to maintain a low sodium diet.

## 2015-01-21 NOTE — Progress Notes (Signed)
      HPI [name] is referred today by Dr. Alain Marion for evaluation of atrial fibrillation. He is a pleasant 59 yo man with HTN who presented several weeks ago with atrial fib and an RVR. He reverted back to NSR. He feels well. He has had exercise myoview stress testing 18 months ago which was normal. He has minimal chest pressure with exertion. He has not had syncope. He has not had syncope. He was placed on ASA. His CHADSVASC score is 1 (HTN).   Allergies  Allergen Reactions  . Atorvastatin     REACTION: leg cramps per patient with a large dose  . Flomax [Tamsulosin Hcl]     Hard time breathing     Current Outpatient Prescriptions  Medication Sig Dispense Refill  . aspirin EC 81 MG tablet Take 81 mg by mouth daily.    Marland Kitchen doxazosin (CARDURA) 4 MG tablet Take 1 tablet (4 mg total) by mouth daily. 90 tablet 3  . losartan (COZAAR) 100 MG tablet TAKE 1 TABLET BY MOUTH EVERY DAY 30 tablet 11  . Omega-3 Fatty Acids (FISH OIL PO) Take 1 capsule by mouth daily.     No current facility-administered medications for this visit.     Past Medical History  Diagnosis Date  . Hyperlipidemia   . GERD (gastroesophageal reflux disease)   . Headache(784.0)   . URI (upper respiratory infection)     ROS:   All systems reviewed and negative except as noted in the HPI.   Past Surgical History  Procedure Laterality Date  . Inguinal hernia repair       Family History  Problem Relation Age of Onset  . Coronary artery disease Father   . Diabetes Father   . Heart disease Father   . Liver disease Mother   . Kidney disease Mother   . Heart disease Mother   . Coronary artery disease Mother   . Colon cancer Neg Hx      History   Social History  . Marital Status: Divorced    Spouse Name: N/A  . Number of Children: 1  . Years of Education: N/A   Occupational History  . Animal nutritionist    Social History Main Topics  . Smoking status: Never Smoker   . Smokeless tobacco:  Never Used  . Alcohol Use: No     Comment: rare  . Drug Use: No  . Sexual Activity: No   Other Topics Concern  . Not on file   Social History Narrative   Coffee daily      BP 114/74 mmHg  Pulse 74  Ht 5\' 9"  (1.753 m)  Wt 204 lb 3.2 oz (92.625 kg)  BMI 30.14 kg/m2  Physical Exam:  Well appearing 59 yo man, NAD HEENT: Unremarkable Neck:  6 cm JVD, no thyromegally Back:  No CVA tenderness Lungs:  Clear with no wheezes HEART:  Regular rate rhythm, no murmurs, no rubs, no clicks Abd:  soft, positive bowel sounds, no organomegally, no rebound, no guarding Ext:  2 plus pulses, no edema, no cyanosis, no clubbing Skin:  No rashes no nodules Neuro:  CN II through XII intact, motor grossly intact  EKG - nsr with LVH, non-specific T wave abnormality   Assess/Plan:

## 2015-01-21 NOTE — Patient Instructions (Signed)
Medication Instructions:  Your physician has recommended you make the following change in your medication:  1) START - Take two Flecainide 100 mg tablet by mouth as needed for palpitations lasting longer than 5 minutes - MAXIMUM of three tablets in a day.     Labwork: NONE  Testing/Procedures: NONE  Follow-Up: Your physician wants you to follow-up in: 6 months with Dr. Lovena Le. You will receive a reminder letter in the mail two months in advance. If you don't receive a letter, please call our office to schedule the follow-up appointment.   Any Other Special Instructions Will Be Listed Below (If Applicable).

## 2015-01-21 NOTE — Addendum Note (Signed)
Addended by: Newt Minion on: 01/21/2015 03:27 PM   Modules accepted: Orders

## 2015-01-23 ENCOUNTER — Telehealth: Payer: Self-pay | Admitting: Internal Medicine

## 2015-01-23 NOTE — Telephone Encounter (Signed)
New message     Pt has questions regarding lipitor Please call to discuss

## 2015-01-23 NOTE — Telephone Encounter (Signed)
I have asked the patient to contact his PCP to discuss his lipids as Dr Lovena Le is an EP MD.  He did not prescribed him Lipitor

## 2015-01-31 ENCOUNTER — Encounter: Payer: Self-pay | Admitting: Internal Medicine

## 2015-01-31 ENCOUNTER — Ambulatory Visit (INDEPENDENT_AMBULATORY_CARE_PROVIDER_SITE_OTHER): Payer: BLUE CROSS/BLUE SHIELD | Admitting: Internal Medicine

## 2015-01-31 VITALS — BP 132/88 | HR 72 | Ht 69.0 in | Wt 202.0 lb

## 2015-01-31 DIAGNOSIS — I4891 Unspecified atrial fibrillation: Secondary | ICD-10-CM

## 2015-01-31 DIAGNOSIS — E785 Hyperlipidemia, unspecified: Secondary | ICD-10-CM

## 2015-01-31 DIAGNOSIS — N320 Bladder-neck obstruction: Secondary | ICD-10-CM | POA: Diagnosis not present

## 2015-01-31 DIAGNOSIS — I10 Essential (primary) hypertension: Secondary | ICD-10-CM | POA: Diagnosis not present

## 2015-01-31 MED ORDER — ATORVASTATIN CALCIUM 10 MG PO TABS: 10.0000 mg | ORAL_TABLET | Freq: Every day | ORAL | Status: DC

## 2015-01-31 MED ORDER — LOSARTAN POTASSIUM 100 MG PO TABS: 100.0000 mg | ORAL_TABLET | Freq: Every day | ORAL | Status: DC

## 2015-01-31 MED ORDER — VITAMIN D 1000 UNITS PO TABS: 1000.0000 [IU] | ORAL_TABLET | Freq: Every day | ORAL | Status: AC

## 2015-01-31 NOTE — Progress Notes (Signed)
Subjective:  Patient ID: [name], male    DOB: March 03, 1956  Age: 59 y.o. MRN: [id number]  CC: Annual Exam   HPI [name] presents for dyslipidemia, PAF. Isolated episode 2016 Dr Lovena Le recommended he continue ASA. He will be given flecainide to be taken as a pill in the pocket.   Outpatient Prescriptions Prior to Visit  Medication Sig Dispense Refill  . aspirin EC 81 MG tablet Take 81 mg by mouth daily.    Marland Kitchen doxazosin (CARDURA) 4 MG tablet Take 1 tablet (4 mg total) by mouth daily. 90 tablet 3  . flecainide (TAMBOCOR) 100 MG tablet Take 1 tablet (100 mg total) by mouth as needed (Take 2 tablets as needed for palpitations lasting longer than 5 mintues.). 30 tablet 3  . Omega-3 Fatty Acids (FISH OIL PO) Take 1 capsule by mouth daily.    Marland Kitchen losartan (COZAAR) 100 MG tablet TAKE 1 TABLET BY MOUTH EVERY DAY 30 tablet 11   No facility-administered medications prior to visit.    ROS Review of Systems  Constitutional: Negative for appetite change, fatigue and unexpected weight change.  HENT: Negative for congestion, nosebleeds, sneezing, sore throat and trouble swallowing.   Eyes: Negative for itching and visual disturbance.  Respiratory: Negative for cough.   Cardiovascular: Negative for chest pain, palpitations and leg swelling.  Gastrointestinal: Negative for nausea, diarrhea, blood in stool and abdominal distention.  Genitourinary: Negative for frequency and hematuria.  Musculoskeletal: Negative for back pain, joint swelling, gait problem and neck pain.  Skin: Negative for rash.  Neurological: Negative for dizziness, tremors, speech difficulty and weakness.  Psychiatric/Behavioral: Negative for sleep disturbance, dysphoric mood and agitation. The patient is not nervous/anxious.     Objective:  BP 132/88 mmHg  Pulse 72  Ht 5\' 9"  (1.753 m)  Wt 202 lb (91.627 kg)  BMI 29.82 kg/m2  SpO2 92%  BP Readings from Last 3 Encounters:  01/31/15 132/88    01/21/15 114/74  12/30/14 136/73    Wt Readings from Last 3 Encounters:  01/31/15 202 lb (91.627 kg)  01/21/15 204 lb 3.2 oz (92.625 kg)  12/29/14 200 lb (90.719 kg)    Physical Exam  Constitutional: He is oriented to person, place, and time. He appears well-developed. No distress.  NAD  HENT:  Mouth/Throat: Oropharynx is clear and moist.  Eyes: Conjunctivae are normal. Pupils are equal, round, and reactive to light.  Neck: Normal range of motion. No JVD present. No thyromegaly present.  Cardiovascular: Normal rate, regular rhythm, normal heart sounds and intact distal pulses.  Exam reveals no gallop and no friction rub.   No murmur heard. Pulmonary/Chest: Effort normal and breath sounds normal. No respiratory distress. He has no wheezes. He has no rales. He exhibits no tenderness.  Abdominal: Soft. Bowel sounds are normal. He exhibits no distension and no mass. There is no tenderness. There is no rebound and no guarding.  Musculoskeletal: Normal range of motion. He exhibits no edema or tenderness.  Lymphadenopathy:    He has no cervical adenopathy.  Neurological: He is alert and oriented to person, place, and time. He has normal reflexes. No cranial nerve deficit. He exhibits normal muscle tone. He displays a negative Romberg sign. Coordination and gait normal.  Skin: Skin is warm and dry. No rash noted.  Psychiatric: He has a normal mood and affect. His behavior is normal. Judgment and thought content normal.    Lab Results  Component Value Date   WBC 7.0 12/30/2014  HGB 14.0 12/30/2014   HCT 39.9 12/30/2014   PLT 197 12/30/2014   GLUCOSE 113* 12/29/2014   CHOL 166 12/30/2014   TRIG 195* 12/30/2014   HDL 40* 12/30/2014   LDLDIRECT 140.9 06/03/2011   LDLCALC 87 12/30/2014   ALT 37 08/15/2014   AST 23 08/15/2014   NA 137 12/29/2014   K 3.8 12/29/2014   CL 107 12/29/2014   CREATININE 1.12 12/30/2014   BUN 19 12/29/2014   CO2 24 12/29/2014   TSH 1.266 12/30/2014    PSA 1.06 05/22/2013   HGBA1C 5.7* 12/30/2014    Dg Chest 2 View  12/30/2014   CLINICAL DATA:  58 year old male with tachycardia.  EXAM: CHEST  2 VIEW  COMPARISON:  Chest radiograph dated 09/14/2013  FINDINGS: The heart size and mediastinal contours are within normal limits. Both lungs are clear. The visualized skeletal structures are unremarkable.  IMPRESSION: No active cardiopulmonary disease.   Electronically Signed   By: Anner Crete M.D.   On: 12/30/2014 00:20    Assessment & Plan:   Milad was seen today for annual exam.  Diagnoses and all orders for this visit:  Essential hypertension  Atrial fibrillation with rapid ventricular response  Other orders -     atorvastatin (LIPITOR) 10 MG tablet; Take 1 tablet (10 mg total) by mouth daily. -     cholecalciferol (VITAMIN D) 1000 UNITS tablet; Take 1 tablet (1,000 Units total) by mouth daily. -     losartan (COZAAR) 100 MG tablet; Take 1 tablet (100 mg total) by mouth daily.  I have changed Mr. Calais's losartan. I am also having him start on atorvastatin and cholecalciferol. Additionally, I am having him maintain his Omega-3 Fatty Acids (FISH OIL PO), aspirin EC, doxazosin, and flecainide.  Meds ordered this encounter  Medications  . atorvastatin (LIPITOR) 10 MG tablet    Sig: Take 1 tablet (10 mg total) by mouth daily.    Dispense:  30 tablet    Refill:  11  . cholecalciferol (VITAMIN D) 1000 UNITS tablet    Sig: Take 1 tablet (1,000 Units total) by mouth daily.    Dispense:  100 tablet    Refill:  3  . losartan (COZAAR) 100 MG tablet    Sig: Take 1 tablet (100 mg total) by mouth daily.    Dispense:  30 tablet    Refill:  11     Follow-up: Return in about 6 months (around 08/03/2015) for a follow-up visit.  Walker Kehr, MD

## 2015-01-31 NOTE — Progress Notes (Signed)
Pre visit review using our clinic review tool, if applicable. No additional management support is needed unless otherwise documented below in the visit note. 

## 2015-02-02 NOTE — Assessment & Plan Note (Signed)
Isolated episode 2016 Dr Lovena Le recommended he continue ASA. He will be given flecainide to be taken as a pill in the pocket.

## 2015-02-02 NOTE — Assessment & Plan Note (Signed)
Losartan, Cardura

## 2015-02-02 NOTE — Assessment & Plan Note (Signed)
Chronic On Lipitor 

## 2015-02-02 NOTE — Assessment & Plan Note (Signed)
Cardura - increased to 4 mg/d

## 2015-03-10 ENCOUNTER — Ambulatory Visit (INDEPENDENT_AMBULATORY_CARE_PROVIDER_SITE_OTHER): Payer: BLUE CROSS/BLUE SHIELD | Admitting: Family Medicine

## 2015-03-10 VITALS — BP 110/66 | HR 68 | Temp 98.4°F | Resp 18 | Ht 69.0 in | Wt 201.8 lb

## 2015-03-10 DIAGNOSIS — Z111 Encounter for screening for respiratory tuberculosis: Secondary | ICD-10-CM | POA: Diagnosis not present

## 2015-03-10 NOTE — Patient Instructions (Signed)
The test needs to be interpreted in 48-72 hours, which would be after 6 PM on Wednesday until before 6 PM on Thursday. If you do not come back for your recheck within that time period, it is an invalid test.

## 2015-03-10 NOTE — Progress Notes (Signed)
Patient ID: [name], male    DOB: 10/27/1955  Age: 59 y.o. MRN: [id number]  Chief Complaint  Patient presents with  . 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 Has not been exposed to tuberculosis that he knows of have her. He has had negative tests in the past.  Current allergies, medications, problem list, past/family and social histories reviewed.  Objective:  BP 110/66 mmHg  Pulse 68  Temp(Src) 98.4 F (36.9 C) (Oral)  Resp 18  Ht 5\' 9"  (1.753 m)  Wt 201 lb 12.8 oz (91.536 kg)  BMI 29.79 kg/m2  SpO2 97%   no acute distress. Not examined today.  Assessment & Plan:   Assessment: 1. Screening-pulmonary TB       Plan: Orders Placed This Encounter  Procedures  . TB Skin Test    Order Specific Question:  Has patient ever tested positive?    Answer:  No    No orders of the defined types were placed in this encounter.      PPD skin test will be given    Patient Instructions   The test needs to be interpreted in 48-72 hours, which would be after 6 PM on Wednesday until before 6 PM on Thursday. If you do not come back for your recheck within that time period, it is an invalid test.     Return in about 2 days (around 03/12/2015).   HOPPER,DAVID, MD 03/10/2015

## 2015-03-10 NOTE — Progress Notes (Signed)
  Tuberculosis Risk Questionnaire  1. Yes Were you born outside the [place] in one of the following parts of the world: [place], [place], [place], [place] or [place]?    2.no No Have you traveled outside the [place] and lived for more than one month in one of the following parts of the world: [place], [place], [place], [place] or [place]?    3. No Do you have a compromised immune system such as from any of the following conditions:HIV/AIDS, organ or bone marrow transplantation, diabetes, immunosuppressive medicines (e.g. Prednisone, Remicaide), leukemia, lymphoma, cancer of the head or neck, gastrectomy or jejunal bypass, end-stage renal disease (on dialysis), or silicosis?     4. No Have you ever or do you plan on working in: a residential care center, a health care facility, a jail or prison or homeless shelter?    5. No Have you ever: injected illegal drugs, used crack cocaine, lived in a homeless shelter  or been in jail or prison?     6. No Have you ever been exposed to anyone with infectious tuberculosis?    Tuberculosis Symptom Questionnaire  Do you currently have any of the following symptoms?  1. No Unexplained cough lasting more than 3 weeks?   2. No Unexplained fever lasting more than 3 weeks.   3. No Night Sweats (sweating that leaves the bedclothes and sheets wet)     4. No Shortness of Breath   5. No Chest Pain   6. No Unintentional weight loss    7. No Unexplained fatigue (very tired for no reason)

## 2015-03-13 ENCOUNTER — Ambulatory Visit (INDEPENDENT_AMBULATORY_CARE_PROVIDER_SITE_OTHER): Payer: BLUE CROSS/BLUE SHIELD

## 2015-03-13 DIAGNOSIS — Z111 Encounter for screening for respiratory tuberculosis: Secondary | ICD-10-CM

## 2015-03-13 LAB — TB SKIN TEST
Induration: 0 mm
TB Skin Test: NEGATIVE

## 2015-03-13 NOTE — Progress Notes (Signed)
   Subjective:    Patient ID: [name], male    DOB: December 29, 1955, 59 y.o.   MRN: [id number]  HPI Patient was here today for a PPD reading. Results negative. Induration 0.23mm. Patient was given a copy of his results.     Review of Systems     Objective:   Physical Exam        Assessment & Plan:

## 2015-05-02 ENCOUNTER — Telehealth: Payer: Self-pay | Admitting: Internal Medicine

## 2015-05-02 MED ORDER — DOXAZOSIN MESYLATE 4 MG PO TABS: 4.0000 mg | ORAL_TABLET | Freq: Two times a day (BID) | ORAL | Status: DC

## 2015-05-02 NOTE — Telephone Encounter (Signed)
Pt needs BID cardura for sx's control

## 2015-07-01 ENCOUNTER — Telehealth: Payer: Self-pay | Admitting: Internal Medicine

## 2015-07-01 DIAGNOSIS — I10 Essential (primary) hypertension: Secondary | ICD-10-CM

## 2015-07-01 NOTE — Telephone Encounter (Signed)
Needs a cardiology f/up

## 2015-07-08 ENCOUNTER — Encounter: Payer: Self-pay | Admitting: *Deleted

## 2015-07-08 ENCOUNTER — Encounter: Payer: Self-pay | Admitting: Cardiology

## 2015-07-08 ENCOUNTER — Ambulatory Visit (INDEPENDENT_AMBULATORY_CARE_PROVIDER_SITE_OTHER): Payer: BLUE CROSS/BLUE SHIELD | Admitting: Cardiology

## 2015-07-08 VITALS — BP 100/70 | HR 80 | Ht 69.0 in | Wt 202.8 lb

## 2015-07-08 DIAGNOSIS — R079 Chest pain, unspecified: Secondary | ICD-10-CM | POA: Diagnosis not present

## 2015-07-08 DIAGNOSIS — I480 Paroxysmal atrial fibrillation: Secondary | ICD-10-CM

## 2015-07-08 DIAGNOSIS — I10 Essential (primary) hypertension: Secondary | ICD-10-CM

## 2015-07-08 DIAGNOSIS — E785 Hyperlipidemia, unspecified: Secondary | ICD-10-CM

## 2015-07-08 MED ORDER — NITROGLYCERIN 0.4 MG SL SUBL: 0.4000 mg | SUBLINGUAL_TABLET | SUBLINGUAL | Status: DC | PRN

## 2015-07-08 NOTE — Assessment & Plan Note (Signed)
On Lipitor 

## 2015-07-08 NOTE — Assessment & Plan Note (Signed)
Isolated episode July 2016 Dr Lovena Le recommended ASA.  CHADs VASc=1 He was given flecainide to be taken as a pill in the pocket.

## 2015-07-08 NOTE — Progress Notes (Signed)
07/08/2015 [name]   23-Jun-1955  [id number]  Primary Physician Walker Kehr, MD Primary Cardiologist: Dr Acie Fredrickson  HPI:  60 year old [place] male who presented to [facility] on 12/29/2014 after he started having palpitations.. He was noted to be in [facility] with RVR, initially felt to be VT. The rhythm broke spontaneously and he returned to normal sinus rhythm, sinus bradycardia. He immediately felt better afterward. He had a negative Myoview with EF 53% in 07/25/2013.  Dr. Lovena Le evaluated the pt and felt he had atrial fibrillation with aberrancy. Given his low CHA2DS2-Vasc score of 1 he was placed on low-dose aspirin only. He was not prescibed a beta blocker secondary to bradycardia.           He is in the office today with complaints of chest pain at work. He describes mid sternal chest pain "deep pain" when he is pushing or pulling at work. He works nights and apparently his job is fairly strenuous. He denies any radiation of pain but admits to associated dyspnea. He also says last night he had nausea and vomiting twice (not associated with chest pain or palpitations). During those episodes he recorded a HR of 92 which he says is high for him. HR today 70- NSR.  History today was taken with the assistance of an interpreter.    Current Outpatient Prescriptions  Medication Sig Dispense Refill  . aspirin EC 81 MG tablet Take 81 mg by mouth daily.    Marland Kitchen atorvastatin (LIPITOR) 10 MG tablet Take 1 tablet (10 mg total) by mouth daily. 30 tablet 11  . cholecalciferol (VITAMIN D) 1000 UNITS tablet Take 1 tablet (1,000 Units total) by mouth daily. 100 tablet 3  . doxazosin (CARDURA) 4 MG tablet Take 1 tablet (4 mg total) by mouth 2 (two) times daily. 180 tablet 3  . flecainide (TAMBOCOR) 100 MG tablet Take 1 tablet (100 mg total) by mouth as needed (Take 2 tablets as needed for palpitations lasting longer than 5 mintues.). 30 tablet 3  . losartan (COZAAR) 100 MG tablet Take 1 tablet  (100 mg total) by mouth daily. 30 tablet 11  . Omega-3 Fatty Acids (FISH OIL PO) Take 1 capsule by mouth daily.    . nitroGLYCERIN (NITROSTAT) 0.4 MG SL tablet Place 1 tablet (0.4 mg total) under the tongue every 5 (five) minutes as needed for chest pain. 25 tablet 2   No current facility-administered medications for this visit.    Allergies  Allergen Reactions  . Atorvastatin     REACTION: leg cramps per patient with a large dose  . Flomax [Tamsulosin Hcl]     Hard time breathing    Social History   Social History  . Marital Status: Divorced    Spouse Name: N/A  . Number of Children: 1  . Years of Education: N/A   Occupational History  . Animal nutritionist    Social History Main Topics  . Smoking status: Never Smoker   . Smokeless tobacco: Never Used  . Alcohol Use: No     Comment: rare  . Drug Use: No  . Sexual Activity: No   Other Topics Concern  . Not on file   Social History Narrative   Coffee daily      Review of Systems: General: negative for chills, fever, night sweats or weight changes.  Cardiovascular: negative for edema, orthopnea, palpitations, paroxysmal nocturnal dyspnea or shortness of breath Dermatological: negative for rash Respiratory: negative for cough or wheezing Urologic:  negative for hematuria Abdominal: negative for diarrhea, bright red blood per rectum, melena, or hematemesis Nause and vomiting last PM secondary to GI- ? arrythmia Neurologic: negative for visual changes, syncope, or dizziness All other systems reviewed and are otherwise negative except as noted above.    Blood pressure 100/70, pulse 80, height 5\' 9"  (1.753 m), weight 202 lb 12.8 oz (91.989 kg).  General appearance: alert, cooperative and no distress Neck: no carotid bruit and no JVD Lungs: clear to auscultation bilaterally Heart: regular rate and rhythm Abdomen: not distended Extremities: extremities normal, atraumatic, no cyanosis or edema Skin: Skin color, texture,  turgor normal. No rashes or lesions Neurologic: Grossly normal  EKG NSR-70  ASSESSMENT AND PLAN:   Chest pain with moderate risk of acute coronary syndrome 2015 low risk Myoview Recurrent exertional chest pain lately at work  Essential hypertension Controlled  Dyslipidemia On Lipitor  PAF (paroxysmal atrial fibrillation) [facility]) Isolated episode July 2016 Dr Lovena Le recommended ASA.  CHADs VASc=1 He was given flecainide to be taken as a pill in the pocket.  He had nausea and vomiting twice yesterday - ? Viral GI vs arrhythmia.    PLAN  Exercise Myoview. F/U pending those results. I did prescribe NTG SL prn with instructions. He is not on a beta blocker secondary to sinus bradycardia.   Kerin Ransom K PA-C 07/08/2015 11:55 AM

## 2015-07-08 NOTE — Assessment & Plan Note (Addendum)
2015 low risk Myoview Recurrent exertional chest pain lately at work

## 2015-07-08 NOTE — Assessment & Plan Note (Signed)
Controlled.  

## 2015-07-08 NOTE — Patient Instructions (Signed)
Medication Instructions:   START TAKING  NITROGLYCERIN  FOR CHEST PAIN   If you need a refill on your cardiac medications before your next appointment, please call your pharmacy.  Labwork: NONE ORDER TODAY    Testing/Procedures:  Your physician has requested that you have en exercise stress myoview. For further information please visit [email or web address]. Please follow instruction sheet, as given.   Follow-Up:  BASED UPON RESULTS OF STRESS TEST   Any Other Special Instructions Will Be Listed Below (If Applicable).

## 2015-07-09 ENCOUNTER — Ambulatory Visit (INDEPENDENT_AMBULATORY_CARE_PROVIDER_SITE_OTHER)
Admission: RE | Admit: 2015-07-09 | Discharge: 2015-07-09 | Disposition: A | Discharge status: Home / Self Care | Payer: BLUE CROSS/BLUE SHIELD | Source: Ambulatory Visit | Attending: Nurse Practitioner | Admitting: Nurse Practitioner

## 2015-07-09 ENCOUNTER — Other Ambulatory Visit: Payer: Self-pay | Admitting: Nurse Practitioner

## 2015-07-09 ENCOUNTER — Ambulatory Visit (INDEPENDENT_AMBULATORY_CARE_PROVIDER_SITE_OTHER): Payer: BLUE CROSS/BLUE SHIELD | Admitting: Nurse Practitioner

## 2015-07-09 ENCOUNTER — Encounter: Payer: Self-pay | Admitting: Nurse Practitioner

## 2015-07-09 VITALS — BP 110/78 | HR 77 | Temp 98.0°F | Resp 14 | Ht 69.0 in | Wt 201.0 lb

## 2015-07-09 DIAGNOSIS — R0789 Other chest pain: Secondary | ICD-10-CM

## 2015-07-09 MED ORDER — DOXYCYCLINE HYCLATE 100 MG PO TABS: 100.0000 mg | ORAL_TABLET | Freq: Two times a day (BID) | ORAL | Status: DC

## 2015-07-09 MED ORDER — PREDNISONE 10 MG PO TABS: ORAL_TABLET | ORAL | Status: DC

## 2015-07-09 NOTE — Patient Instructions (Addendum)
Prednisone with breakfast or lunch at the latest.  6 tablets on day 1, 5 tablets on day 2, 4 tablets on day 3, 3 tablets on day 4, 2 tablets day 5, 1 tablet on day 6...done! Take tablets all together not spaced out Don't take with NSAIDs (Ibuprofen, Aleve, Naproxen, Meloxicam ect...)  We will call with x-ray results and further instructions if needed.  Seek emergency care if worsens, unable to speak in full sentences, or confused.

## 2015-07-09 NOTE — Progress Notes (Signed)
Pre visit review using our clinic review tool, if applicable. No additional management support is needed unless otherwise documented below in the visit note. 

## 2015-07-09 NOTE — Progress Notes (Signed)
Patient ID: [name], male    DOB: 1956/03/01  Age: 59 y.o. MRN: [id number]  CC: Acute Visit   HPI [name] presents for CC of chest tightness x 3 days, vomiting 3 days ago. He is accompanied by his mother and his translator.  1) Tightness in chest, trouble breathing when lying down  Vomiting 2 x 3 days ago  Vomiting 1 time later EKG at [facility] office was similar to 01/21/15 EKG   Will obtain Stress test, gave nitroglycerin sublingual  When breathing in feels tight 3rd shift- couldn't do job over the past 2 days due to tightness in mid chest with strenuous exercise.   Treatment to date: None   Sick contacts: Denies  History [name] has a past medical history of Hyperlipidemia; GERD (gastroesophageal reflux disease); Headache(784.0); and URI (upper respiratory infection).   He has past surgical history that includes Inguinal hernia repair.   His family history includes Coronary artery disease in his father and mother; Diabetes in his father; Heart attack in his maternal grandfather and mother; Heart disease in his father and mother; Hypertension in his mother; Kidney disease in his mother; Liver disease in his mother. There is no history of Colon cancer or Stroke.He reports that he has never smoked. He has never used smokeless tobacco. He reports that he does not drink alcohol or use illicit drugs.  Outpatient Prescriptions Prior to Visit  Medication Sig Dispense Refill  . aspirin EC 81 MG tablet Take 81 mg by mouth daily.    Marland Kitchen atorvastatin (LIPITOR) 10 MG tablet Take 1 tablet (10 mg total) by mouth daily. 30 tablet 11  . cholecalciferol (VITAMIN D) 1000 UNITS tablet Take 1 tablet (1,000 Units total) by mouth daily. 100 tablet 3  . doxazosin (CARDURA) 4 MG tablet Take 1 tablet (4 mg total) by mouth 2 (two) times daily. 180 tablet 3  . flecainide (TAMBOCOR) 100 MG tablet Take 1 tablet (100 mg total) by mouth as needed (Take 2 tablets as needed for palpitations  lasting longer than 5 mintues.). 30 tablet 3  . losartan (COZAAR) 100 MG tablet Take 1 tablet (100 mg total) by mouth daily. 30 tablet 11  . nitroGLYCERIN (NITROSTAT) 0.4 MG SL tablet Place 1 tablet (0.4 mg total) under the tongue every 5 (five) minutes as needed for chest pain. 25 tablet 2  . Omega-3 Fatty Acids (FISH OIL PO) Take 1 capsule by mouth daily.     No facility-administered medications prior to visit.    ROS Review of Systems  Constitutional: Negative for fever, chills, diaphoresis and fatigue.  Respiratory: Positive for chest tightness and shortness of breath. Negative for cough and wheezing.   Cardiovascular: Negative for chest pain, palpitations and leg swelling.  Gastrointestinal: Positive for vomiting. Negative for nausea, abdominal pain, diarrhea, constipation, blood in stool, abdominal distention and anal bleeding.  Neurological: Negative for dizziness, seizures, syncope, weakness and headaches.    Objective:  BP 110/78 mmHg  Pulse 77  Temp(Src) 98 F (36.7 C) (Oral)  Resp 14  Ht 5\' 9"  (1.753 m)  Wt 201 lb (91.173 kg)  BMI 29.67 kg/m2  SpO2 97%  Physical Exam  Constitutional: He is oriented to person, place, and time. He appears well-developed and well-nourished. No distress.  HENT:  Head: Normocephalic and atraumatic.  Right Ear: External ear normal.  Left Ear: External ear normal.  Mouth/Throat: Oropharynx is clear and moist. No oropharyngeal exudate.  Cardiovascular: Normal rate and regular rhythm.  Exam reveals  no gallop and no friction rub.   No murmur heard. Pulmonary/Chest: Effort normal and breath sounds normal. No respiratory distress. He has no wheezes. He has no rales. He exhibits no tenderness.  Musculoskeletal: Normal range of motion. He exhibits no edema or tenderness.  No tenderness to palpation of mid chest  Neurological: He is alert and oriented to person, place, and time.  Skin: Skin is warm and dry. No rash noted. He is not diaphoretic.   Psychiatric: He has a normal mood and affect. His behavior is normal. Judgment and thought content normal.   Assessment & Plan:   [name] was seen today for acute visit.  Diagnoses and all orders for this visit:  Chest tightness -     DG Chest 2 View; Future  Other orders -     predniSONE (DELTASONE) 10 MG tablet; Take 6 tablets by mouth on day 1 with breakfast then decrease by 1 tablet each day until gone.   I am having [name] start on predniSONE. I am also having him maintain his Omega-3 Fatty Acids (FISH OIL PO), aspirin EC, flecainide, atorvastatin, cholecalciferol, losartan, doxazosin, and nitroGLYCERIN.  Meds ordered this encounter  Medications  . predniSONE (DELTASONE) 10 MG tablet    Sig: Take 6 tablets by mouth on day 1 with breakfast then decrease by 1 tablet each day until gone.    Dispense:  21 tablet    Refill:  0    Order Specific Question:  Supervising Provider    Answer:  Crecencio Mc [[id number]]     Follow-up: Return if symptoms worsen or fail to improve.

## 2015-07-10 ENCOUNTER — Telehealth (HOSPITAL_COMMUNITY): Payer: Self-pay | Admitting: *Deleted

## 2015-07-10 DIAGNOSIS — R0789 Other chest pain: Secondary | ICD-10-CM | POA: Insufficient documentation

## 2015-07-10 NOTE — Telephone Encounter (Signed)
Patient given detailed instructions per Myocardial Perfusion Study Information Sheet for the test on 07/15/15 at 745. Patient notified to arrive 15 minutes early and that it is imperative to arrive on time for appointment to keep from having the test rescheduled.  If you need to cancel or reschedule your appointment, please call the office within 24 hours of your appointment. Failure to do so may result in a cancellation of your appointment, and a $50 no show fee. Patient verbalized understanding.Hubbard Robinson, RN

## 2015-07-10 NOTE — Assessment & Plan Note (Addendum)
New onset Sounds MSK in nature or viral.  Well appearing today Will obtain chest x-ray  Prednisone taper with instructions given through interpreter Will follow after results

## 2015-07-11 ENCOUNTER — Telehealth: Payer: Self-pay

## 2015-07-11 ENCOUNTER — Telehealth: Payer: Self-pay | Admitting: Internal Medicine

## 2015-07-11 NOTE — Telephone Encounter (Signed)
Pt would like to have 8 mg doxazosin and take 1/2 bid so insurance will cover.   Pls advise.

## 2015-07-11 NOTE — Telephone Encounter (Signed)
Pt called request to speak to the assistant or Dr. Camila Li concern about the x-ray result that he saw Delorise Jackson for yesterday. Please call him back or his mother

## 2015-07-14 MED ORDER — DOXAZOSIN MESYLATE 8 MG PO TABS: 4.0000 mg | ORAL_TABLET | Freq: Two times a day (BID) | ORAL | Status: DC

## 2015-07-14 NOTE — Telephone Encounter (Signed)
Ok Thx 

## 2015-07-14 NOTE — Telephone Encounter (Signed)
erx sent.   Message via my chart sent.

## 2015-07-15 ENCOUNTER — Ambulatory Visit (HOSPITAL_COMMUNITY): Payer: BLUE CROSS/BLUE SHIELD | Attending: Cardiovascular Disease

## 2015-07-15 DIAGNOSIS — R079 Chest pain, unspecified: Secondary | ICD-10-CM | POA: Insufficient documentation

## 2015-07-15 DIAGNOSIS — Z8249 Family history of ischemic heart disease and other diseases of the circulatory system: Secondary | ICD-10-CM | POA: Diagnosis not present

## 2015-07-15 DIAGNOSIS — I10 Essential (primary) hypertension: Secondary | ICD-10-CM | POA: Insufficient documentation

## 2015-07-15 DIAGNOSIS — R0602 Shortness of breath: Secondary | ICD-10-CM | POA: Diagnosis not present

## 2015-07-15 DIAGNOSIS — R002 Palpitations: Secondary | ICD-10-CM | POA: Diagnosis not present

## 2015-07-15 LAB — MYOCARDIAL PERFUSION IMAGING
Estimated workload: 11.9 METS
Exercise duration (min): 10 min
Exercise duration (sec): 15 s
LV dias vol: 91 mL
LV sys vol: 31 mL
MPHR: 163 {beats}/min
Peak HR: 142 {beats}/min
Percent HR: 87 %
RATE: 0.35
Rest HR: 56 {beats}/min
SDS: 5
SRS: 5
SSS: 10
TID: 0.83

## 2015-07-15 MED ORDER — TECHNETIUM TC 99M SESTAMIBI GENERIC - CARDIOLITE
10.3000 | Freq: Once | INTRAVENOUS | Status: AC | PRN
  Administered 2015-07-15: 10 via INTRAVENOUS

## 2015-07-15 MED ORDER — TECHNETIUM TC 99M SESTAMIBI GENERIC - CARDIOLITE
31.4000 | Freq: Once | INTRAVENOUS | Status: AC | PRN
  Administered 2015-07-15: 31 via INTRAVENOUS

## 2015-07-21 ENCOUNTER — Telehealth: Payer: Self-pay | Admitting: Cardiology

## 2015-07-21 NOTE — Telephone Encounter (Signed)
Pt advised results normal 

## 2015-07-21 NOTE — Telephone Encounter (Signed)
LVM for pt to call back as soon as possible.    RE: the first message tells why the rx was changed. Pt requested the medication dose increase for insurance purposes.

## 2015-07-21 NOTE — Telephone Encounter (Signed)
Can you please call pts mother regarding this medication that was sent

## 2015-07-21 NOTE — Telephone Encounter (Signed)
Pt's mother called in wanting to know if his results from his stress test on 1/24 were available. Please f/u with her  Thanks

## 2015-07-24 ENCOUNTER — Encounter: Payer: Self-pay | Admitting: Cardiovascular Disease

## 2015-07-24 ENCOUNTER — Ambulatory Visit (INDEPENDENT_AMBULATORY_CARE_PROVIDER_SITE_OTHER): Payer: BLUE CROSS/BLUE SHIELD | Admitting: Cardiovascular Disease

## 2015-07-24 VITALS — BP 110/80 | HR 72 | Ht 69.0 in | Wt 201.3 lb

## 2015-07-24 DIAGNOSIS — R0789 Other chest pain: Secondary | ICD-10-CM | POA: Diagnosis not present

## 2015-07-24 NOTE — Progress Notes (Signed)
[name] returns today after being seen a week and a half ago by [employer] [facility]. 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 [name] who is also a patient of mine. He has positive cardiac risk factors and was complaining of effort angina. A Myoview stress test was entirely normal. I have offered him the option of pursuing an invasive approach (cardiac catheterization) but he does not wish to pursue this at this time. I told him to return sooner than his 1 year appointment should his symptoms progress. This visit was facilitated by a [place] interpreter [phone]).

## 2015-07-24 NOTE — Patient Instructions (Addendum)
NO CHANGE IN CURRENT MEDICATIONS   Your physician wants you to follow-up in 12 MONTHS WITH DR Gwenlyn Found.  You will receive a reminder letter in the mail two months in advance. If you don't receive a letter, please call our office to schedule the follow-up appointment.   \If you need a refill on your cardiac medications before your next appointment, please call your pharmacy.

## 2015-07-24 NOTE — Assessment & Plan Note (Signed)
[name] returns today after being seen a week and a half ago by [facility]. 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 [name] who is also a patient of mine. He has positive cardiac risk factors and was complaining of effort angina. A Myoview stress test was entirely normal. I have offered him the option of pursuing an invasive approach (cardiac catheterization) but he does not wish to pursue this at this time. I told him to return sooner than his 1 year appointment should his symptoms progress.

## 2015-07-25 ENCOUNTER — Telehealth: Payer: Self-pay | Admitting: Internal Medicine

## 2015-07-25 DIAGNOSIS — I10 Essential (primary) hypertension: Secondary | ICD-10-CM

## 2015-07-25 DIAGNOSIS — Z0000 Encounter for general adult medical examination without abnormal findings: Secondary | ICD-10-CM

## 2015-07-25 DIAGNOSIS — Z125 Encounter for screening for malignant neoplasm of prostate: Secondary | ICD-10-CM

## 2015-07-25 DIAGNOSIS — E785 Hyperlipidemia, unspecified: Secondary | ICD-10-CM

## 2015-07-25 NOTE — Telephone Encounter (Signed)
pts mother called in wanting lab orders put in so he can get them done before appt.  She asked to check blood sugar along with something else that I just couldn't understand.  Please give her a call

## 2015-07-29 ENCOUNTER — Telehealth: Payer: Self-pay | Admitting: *Deleted

## 2015-07-29 NOTE — Telephone Encounter (Signed)
Left msg on triage Monday afternoon stating have appt 08/04/15 wanting to do labs prior to appt...[name]

## 2015-07-29 NOTE — Telephone Encounter (Signed)
Called pt no answer LMOM MD ok labs have been entered...[name]

## 2015-07-29 NOTE — Telephone Encounter (Signed)
OK - pls enter A1c, lipids, CMET, CBC, PSA, TSH, UA Thx

## 2015-07-29 NOTE — Telephone Encounter (Signed)
Patient is calling to follow up on this request. Advised that there is no response at this point

## 2015-07-30 ENCOUNTER — Other Ambulatory Visit (INDEPENDENT_AMBULATORY_CARE_PROVIDER_SITE_OTHER): Payer: BLUE CROSS/BLUE SHIELD

## 2015-07-30 DIAGNOSIS — E785 Hyperlipidemia, unspecified: Secondary | ICD-10-CM | POA: Diagnosis not present

## 2015-07-30 DIAGNOSIS — R7989 Other specified abnormal findings of blood chemistry: Secondary | ICD-10-CM

## 2015-07-30 DIAGNOSIS — Z0000 Encounter for general adult medical examination without abnormal findings: Secondary | ICD-10-CM

## 2015-07-30 DIAGNOSIS — I10 Essential (primary) hypertension: Secondary | ICD-10-CM | POA: Diagnosis not present

## 2015-07-30 DIAGNOSIS — Z125 Encounter for screening for malignant neoplasm of prostate: Secondary | ICD-10-CM | POA: Diagnosis not present

## 2015-07-30 LAB — HEPATIC FUNCTION PANEL
ALBUMIN: 3.9 g/dL (ref 3.5–5.2)
ALK PHOS: 60 U/L (ref 39–117)
ALT: 31 U/L (ref 0–53)
AST: 21 U/L (ref 0–37)
Bilirubin, Direct: 0.1 mg/dL (ref 0.0–0.3)
TOTAL PROTEIN: 6.9 g/dL (ref 6.0–8.3)
Total Bilirubin: 0.4 mg/dL (ref 0.2–1.2)

## 2015-07-30 LAB — URINALYSIS, ROUTINE W REFLEX MICROSCOPIC
Bilirubin Urine: NEGATIVE
Hgb urine dipstick: NEGATIVE
Ketones, ur: NEGATIVE
Leukocytes, UA: NEGATIVE
Nitrite: NEGATIVE
PH: 5.5 (ref 5.0–8.0)
Specific Gravity, Urine: 1.025 (ref 1.000–1.030)
TOTAL PROTEIN, URINE-UPE24: NEGATIVE
Urine Glucose: NEGATIVE
Urobilinogen, UA: 0.2 (ref 0.0–1.0)

## 2015-07-30 LAB — LIPID PANEL
CHOLESTEROL: 176 mg/dL (ref 0–200)
HDL: 41.7 mg/dL (ref >39.00)
NonHDL: 133.97
TRIGLYCERIDES: 238 mg/dL — AB (ref 0.0–149.0)
Total CHOL/HDL Ratio: 4
VLDL: 47.6 mg/dL — AB (ref 0.0–40.0)

## 2015-07-30 LAB — BASIC METABOLIC PANEL
BUN: 16 mg/dL (ref 6–23)
CO2: 26 mEq/L (ref 19–32)
CREATININE: 1.02 mg/dL (ref 0.40–1.50)
Calcium: 9 mg/dL (ref 8.4–10.5)
Chloride: 105 mEq/L (ref 96–112)
GFR: 79.83 mL/min (ref >60.00)
GLUCOSE: 88 mg/dL (ref 70–99)
POTASSIUM: 4.2 meq/L (ref 3.5–5.1)
Sodium: 137 mEq/L (ref 135–145)

## 2015-07-30 LAB — CBC WITH DIFFERENTIAL/PLATELET
BASOS PCT: 0.5 % (ref 0.0–3.0)
Basophils Absolute: 0 10*3/uL (ref 0.0–0.1)
EOS PCT: 2.7 % (ref 0.0–5.0)
Eosinophils Absolute: 0.2 10*3/uL (ref 0.0–0.7)
HCT: 43.5 % (ref 39.0–52.0)
Hemoglobin: 14.9 g/dL (ref 13.0–17.0)
LYMPHS ABS: 1.9 10*3/uL (ref 0.7–4.0)
Lymphocytes Relative: 27.7 % (ref 12.0–46.0)
MCHC: 34.1 g/dL (ref 30.0–36.0)
MCV: 89.6 fl (ref 78.0–100.0)
MONO ABS: 0.6 10*3/uL (ref 0.1–1.0)
Monocytes Relative: 8.8 % (ref 3.0–12.0)
NEUTROS PCT: 60.3 % (ref 43.0–77.0)
Neutro Abs: 4.2 10*3/uL (ref 1.4–7.7)
Platelets: 244 10*3/uL (ref 150.0–400.0)
RBC: 4.86 Mil/uL (ref 4.22–5.81)
RDW: 13.2 % (ref 11.5–15.5)
WBC: 6.9 10*3/uL (ref 4.0–10.5)

## 2015-07-30 LAB — TSH: TSH: 1.64 u[IU]/mL (ref 0.35–4.50)

## 2015-07-30 LAB — LDL CHOLESTEROL, DIRECT: Direct LDL: 90 mg/dL

## 2015-07-30 LAB — HEMOGLOBIN A1C: HEMOGLOBIN A1C: 5.7 % (ref 4.6–6.5)

## 2015-07-30 LAB — PSA: PSA: 0.52 ng/mL (ref 0.10–4.00)

## 2015-07-30 NOTE — Telephone Encounter (Signed)
Duplicate msg see previous phone note...[name]

## 2015-08-04 ENCOUNTER — Encounter: Payer: Self-pay | Admitting: Internal Medicine

## 2015-08-04 ENCOUNTER — Ambulatory Visit (INDEPENDENT_AMBULATORY_CARE_PROVIDER_SITE_OTHER): Payer: BLUE CROSS/BLUE SHIELD | Admitting: Internal Medicine

## 2015-08-04 VITALS — BP 124/72 | HR 72 | Wt 200.0 lb

## 2015-08-04 DIAGNOSIS — N320 Bladder-neck obstruction: Secondary | ICD-10-CM

## 2015-08-04 DIAGNOSIS — R0789 Other chest pain: Secondary | ICD-10-CM

## 2015-08-04 DIAGNOSIS — I480 Paroxysmal atrial fibrillation: Secondary | ICD-10-CM

## 2015-08-04 DIAGNOSIS — I10 Essential (primary) hypertension: Secondary | ICD-10-CM

## 2015-08-04 DIAGNOSIS — Z0000 Encounter for general adult medical examination without abnormal findings: Secondary | ICD-10-CM

## 2015-08-04 DIAGNOSIS — E785 Hyperlipidemia, unspecified: Secondary | ICD-10-CM

## 2015-08-04 MED ORDER — FINASTERIDE 5 MG PO TABS: 5.0000 mg | ORAL_TABLET | Freq: Every day | ORAL | Status: DC

## 2015-08-04 MED ORDER — ICOSAPENT ETHYL 1 G PO CAPS: 2.0000 | ORAL_CAPSULE | Freq: Two times a day (BID) | ORAL | Status: DC

## 2015-08-04 NOTE — Assessment & Plan Note (Signed)
No relapse 

## 2015-08-04 NOTE — Assessment & Plan Note (Signed)
Added Vascepa 

## 2015-08-04 NOTE — Assessment & Plan Note (Signed)
Losartan 

## 2015-08-04 NOTE — Progress Notes (Signed)
Pre visit review using our clinic review tool, if applicable. No additional management support is needed unless otherwise documented below in the visit note. 

## 2015-08-04 NOTE — Assessment & Plan Note (Signed)
Dr Gwenlyn Found 2/17 cath pending

## 2015-08-04 NOTE — Progress Notes (Signed)
Subjective:  Patient ID: [name], male    DOB: [birth year]  Age: 60 y.o. MRN: [id number]  CC: No chief complaint on file.   HPI [name] presents for a well exam  Outpatient Prescriptions Prior to Visit  Medication Sig Dispense Refill  . aspirin EC 81 MG tablet Take 81 mg by mouth daily.    Marland Kitchen atorvastatin (LIPITOR) 10 MG tablet Take 1 tablet (10 mg total) by mouth daily. 30 tablet 11  . cholecalciferol (VITAMIN D) 1000 UNITS tablet Take 1 tablet (1,000 Units total) by mouth daily. 100 tablet 3  . doxazosin (CARDURA) 8 MG tablet Take 0.5 tablets (4 mg total) by mouth 2 (two) times daily. 90 tablet 1  . losartan (COZAAR) 100 MG tablet Take 1 tablet (100 mg total) by mouth daily. 30 tablet 11  . nitroGLYCERIN (NITROSTAT) 0.4 MG SL tablet Place 1 tablet (0.4 mg total) under the tongue every 5 (five) minutes as needed for chest pain. 25 tablet 2  . Omega-3 Fatty Acids (FISH OIL PO) Take 1 capsule by mouth daily.    . flecainide (TAMBOCOR) 100 MG tablet Take 1 tablet (100 mg total) by mouth as needed (Take 2 tablets as needed for palpitations lasting longer than 5 mintues.). (Patient not taking: Reported on 08/04/2015) 30 tablet 3  . predniSONE (DELTASONE) 10 MG tablet Take 6 tablets by mouth on day 1 with breakfast then decrease by 1 tablet each day until gone. (Patient not taking: Reported on 08/04/2015) 21 tablet 0  . doxycycline (VIBRA-TABS) 100 MG tablet Take 1 tablet (100 mg total) by mouth 2 (two) times daily. (Patient not taking: Reported on 08/04/2015) 10 tablet 0   No facility-administered medications prior to visit.    ROS Review of Systems  Constitutional: Negative for appetite change, fatigue and unexpected weight change.  HENT: Negative for congestion, nosebleeds, sneezing, sore throat and trouble swallowing.   Eyes: Negative for itching and visual disturbance.  Respiratory: Negative for cough.   Cardiovascular: Negative for chest pain, palpitations and  leg swelling.  Gastrointestinal: Negative for nausea, diarrhea, blood in stool and abdominal distention.  Genitourinary: Negative for frequency and hematuria.  Musculoskeletal: Negative for back pain, joint swelling, gait problem and neck pain.  Skin: Negative for rash.  Neurological: Positive for tremors. Negative for dizziness, speech difficulty and weakness.  Psychiatric/Behavioral: Negative for suicidal ideas, sleep disturbance, dysphoric mood and agitation. The patient is not nervous/anxious.     Objective:  BP 124/72 mmHg  Pulse 72  Wt 200 lb (90.719 kg)  SpO2 95%  BP Readings from Last 3 Encounters:  08/04/15 124/72  07/24/15 110/80  07/09/15 110/78    Wt Readings from Last 3 Encounters:  08/04/15 200 lb (90.719 kg)  07/24/15 201 lb 5 oz (91.315 kg)  07/15/15 202 lb (91.627 kg)    Physical Exam  Constitutional: He is oriented to person, place, and time. He appears well-developed. No distress.  NAD  HENT:  Mouth/Throat: Oropharynx is clear and moist.  Eyes: Conjunctivae are normal. Pupils are equal, round, and reactive to light.  Neck: Normal range of motion. No JVD present. No thyromegaly present.  Cardiovascular: Normal rate, regular rhythm, normal heart sounds and intact distal pulses.  Exam reveals no gallop and no friction rub.   No murmur heard. Pulmonary/Chest: Effort normal and breath sounds normal. No respiratory distress. He has no wheezes. He has no rales. He exhibits no tenderness.  Abdominal: Soft. Bowel sounds are normal. He exhibits  no distension and no mass. There is no tenderness. There is no rebound and no guarding.  Musculoskeletal: Normal range of motion. He exhibits no edema or tenderness.  Lymphadenopathy:    He has no cervical adenopathy.  Neurological: He is alert and oriented to person, place, and time. He has normal reflexes. No cranial nerve deficit. He exhibits normal muscle tone. He displays a negative Romberg sign. Coordination and gait  normal.  Skin: Skin is warm and dry. No rash noted.  Psychiatric: He has a normal mood and affect. His behavior is normal. Judgment and thought content normal.  prostate 1+  Lab Results  Component Value Date   WBC 6.9 07/30/2015   HGB 14.9 07/30/2015   HCT 43.5 07/30/2015   PLT 244.0 07/30/2015   GLUCOSE 88 07/30/2015   CHOL 176 07/30/2015   TRIG 238.0* 07/30/2015   HDL 41.70 07/30/2015   LDLDIRECT 90.0 07/30/2015   LDLCALC 87 12/30/2014   ALT 31 07/30/2015   AST 21 07/30/2015   NA 137 07/30/2015   K 4.2 07/30/2015   CL 105 07/30/2015   CREATININE 1.02 07/30/2015   BUN 16 07/30/2015   CO2 26 07/30/2015   TSH 1.64 07/30/2015   PSA 0.52 07/30/2015   HGBA1C 5.7 07/30/2015    Dg Chest 2 View  07/09/2015  CLINICAL DATA:  Mid chest pain with shortness of breath for 3 days, nonsmoker EXAM: CHEST  2 VIEW COMPARISON:  12/30/2014 FINDINGS: The heart size and vascular pattern normal. No pleural effusion or pneumothorax. Bony thorax intact. Right lung is clear. Minimal opacity left lung base. IMPRESSION: Minimal opacity in the left lung base seen on the PA view. This is most consistent with mild atelectasis. Infectious infiltrate is possible but considered distinctly less likely. Consider follow-up imaging if symptoms persist. Electronically Signed   By: Skipper Cliche M.D.   On: 07/09/2015 14:04    Assessment & Plan:   Diagnoses and all orders for this visit:  Essential hypertension  Bladder neck obstruction  Chest tightness  Dyslipidemia  PAF (paroxysmal atrial fibrillation) (HCC)  Other orders -     Icosapent Ethyl (VASCEPA) 1 g CAPS; Take 2 capsules by mouth 2 (two) times daily. -     finasteride (PROSCAR) 5 MG tablet; Take 1 tablet (5 mg total) by mouth daily.  I have discontinued Mr. Chalupa's doxycycline. I am also having him start on Icosapent Ethyl and finasteride. Additionally, I am having him maintain his Omega-3 Fatty Acids (FISH OIL PO), aspirin EC,  flecainide, atorvastatin, cholecalciferol, losartan, nitroGLYCERIN, predniSONE, and doxazosin.  Meds ordered this encounter  Medications  . Icosapent Ethyl (VASCEPA) 1 g CAPS    Sig: Take 2 capsules by mouth 2 (two) times daily.    Dispense:  120 capsule    Refill:  11  . finasteride (PROSCAR) 5 MG tablet    Sig: Take 1 tablet (5 mg total) by mouth daily.    Dispense:  100 tablet    Refill:  3     Follow-up: No Follow-up on file.  Walker Kehr, MD

## 2015-08-04 NOTE — Assessment & Plan Note (Signed)
Added Finesteride

## 2015-08-05 ENCOUNTER — Telehealth: Payer: Self-pay

## 2015-08-05 NOTE — Telephone Encounter (Signed)
PA approved thru 08/04/2018

## 2015-08-05 NOTE — Telephone Encounter (Signed)
PA initiated for Vascepa via [place]

## 2015-11-18 ENCOUNTER — Ambulatory Visit (INDEPENDENT_AMBULATORY_CARE_PROVIDER_SITE_OTHER): Payer: BLUE CROSS/BLUE SHIELD | Admitting: Internal Medicine

## 2015-11-18 ENCOUNTER — Ambulatory Visit (INDEPENDENT_AMBULATORY_CARE_PROVIDER_SITE_OTHER)
Admission: RE | Admit: 2015-11-18 | Discharge: 2015-11-18 | Disposition: A | Discharge status: Home / Self Care | Payer: BLUE CROSS/BLUE SHIELD | Source: Ambulatory Visit | Attending: Internal Medicine | Admitting: Internal Medicine

## 2015-11-18 ENCOUNTER — Encounter: Payer: Self-pay | Admitting: Internal Medicine

## 2015-11-18 VITALS — BP 90/68 | HR 80 | Temp 98.2°F | Wt 203.0 lb

## 2015-11-18 DIAGNOSIS — R079 Chest pain, unspecified: Secondary | ICD-10-CM | POA: Diagnosis not present

## 2015-11-18 DIAGNOSIS — I480 Paroxysmal atrial fibrillation: Secondary | ICD-10-CM | POA: Diagnosis not present

## 2015-11-18 DIAGNOSIS — R059 Cough, unspecified: Secondary | ICD-10-CM

## 2015-11-18 DIAGNOSIS — I10 Essential (primary) hypertension: Secondary | ICD-10-CM | POA: Diagnosis not present

## 2015-11-18 DIAGNOSIS — R05 Cough: Secondary | ICD-10-CM

## 2015-11-18 MED ORDER — PROMETHAZINE-CODEINE 6.25-10 MG/5ML PO SYRP: 5.0000 mL | ORAL_SOLUTION | ORAL | Status: DC | PRN

## 2015-11-18 NOTE — Assessment & Plan Note (Signed)
Losartan 

## 2015-11-18 NOTE — Progress Notes (Signed)
Pre visit review using our clinic review tool, if applicable. No additional management support is needed unless otherwise documented below in the visit note. 

## 2015-11-18 NOTE — Assessment & Plan Note (Signed)
Isolated episode July 2016 Dr Taylor recommended he continue ASA. He was given flecainide to be taken as a pill in the pocket.  CHADs VASc=1 

## 2015-11-18 NOTE — Assessment & Plan Note (Signed)
Letter for work

## 2015-11-18 NOTE — Assessment & Plan Note (Addendum)
CXR  Likely ?post-URI cough Prom-cod

## 2015-11-18 NOTE — Progress Notes (Signed)
Subjective:  Patient ID: [name], male    DOB: April 16, 1956  Age: 60 y.o. MRN: [id number]  CC: No chief complaint on file.   HPI [name] presents for CAD, HTN, dyslipidemia f/u. C/o cough. C/o CP w/exertion  Outpatient Prescriptions Prior to Visit  Medication Sig Dispense Refill  . aspirin EC 81 MG tablet Take 81 mg by mouth daily.    Marland Kitchen atorvastatin (LIPITOR) 10 MG tablet Take 1 tablet (10 mg total) by mouth daily. 30 tablet 11  . cholecalciferol (VITAMIN D) 1000 UNITS tablet Take 1 tablet (1,000 Units total) by mouth daily. 100 tablet 3  . doxazosin (CARDURA) 8 MG tablet Take 0.5 tablets (4 mg total) by mouth 2 (two) times daily. 90 tablet 1  . Icosapent Ethyl (VASCEPA) 1 g CAPS Take 2 capsules by mouth 2 (two) times daily. 120 capsule 11  . losartan (COZAAR) 100 MG tablet Take 1 tablet (100 mg total) by mouth daily. 30 tablet 11  . nitroGLYCERIN (NITROSTAT) 0.4 MG SL tablet Place 1 tablet (0.4 mg total) under the tongue every 5 (five) minutes as needed for chest pain. 25 tablet 2  . Omega-3 Fatty Acids (FISH OIL PO) Take 1 capsule by mouth daily.    . predniSONE (DELTASONE) 10 MG tablet Take 6 tablets by mouth on day 1 with breakfast then decrease by 1 tablet each day until gone. 21 tablet 0  . finasteride (PROSCAR) 5 MG tablet Take 1 tablet (5 mg total) by mouth daily. (Patient not taking: Reported on 11/18/2015) 100 tablet 3  . flecainide (TAMBOCOR) 100 MG tablet Take 1 tablet (100 mg total) by mouth as needed (Take 2 tablets as needed for palpitations lasting longer than 5 mintues.). (Patient not taking: Reported on 11/18/2015) 30 tablet 3   No facility-administered medications prior to visit.    ROS Review of Systems  Constitutional: Negative for appetite change, fatigue and unexpected weight change.  HENT: Negative for congestion, nosebleeds, sneezing, sore throat and trouble swallowing.   Eyes: Negative for itching and visual disturbance.  Respiratory:  Positive for cough.   Cardiovascular: Negative for chest pain, palpitations and leg swelling.  Gastrointestinal: Negative for nausea, diarrhea, blood in stool and abdominal distention.  Genitourinary: Negative for frequency and hematuria.  Musculoskeletal: Negative for back pain, joint swelling, gait problem and neck pain.  Skin: Negative for rash.  Neurological: Negative for dizziness, tremors, speech difficulty and weakness.  Psychiatric/Behavioral: Negative for sleep disturbance, dysphoric mood and agitation. The patient is not nervous/anxious.     Objective:  BP 90/68 mmHg  Pulse 80  Temp(Src) 98.2 F (36.8 C) (Oral)  Wt 203 lb (92.08 kg)  SpO2 93%  BP Readings from Last 3 Encounters:  11/18/15 90/68  08/04/15 124/72  07/24/15 110/80    Wt Readings from Last 3 Encounters:  11/18/15 203 lb (92.08 kg)  08/04/15 200 lb (90.719 kg)  07/24/15 201 lb 5 oz (91.315 kg)    Physical Exam  Constitutional: He is oriented to person, place, and time. He appears well-developed. No distress.  NAD  HENT:  Mouth/Throat: Oropharynx is clear and moist.  Eyes: Conjunctivae are normal. Pupils are equal, round, and reactive to light.  Neck: Normal range of motion. No JVD present. No thyromegaly present.  Cardiovascular: Normal rate, regular rhythm, normal heart sounds and intact distal pulses.  Exam reveals no gallop and no friction rub.   No murmur heard. Pulmonary/Chest: Effort normal and breath sounds normal. No respiratory distress. He  has no wheezes. He has no rales. He exhibits no tenderness.  Abdominal: Soft. Bowel sounds are normal. He exhibits no distension and no mass. There is no tenderness. There is no rebound and no guarding.  Musculoskeletal: Normal range of motion. He exhibits no edema or tenderness.  Lymphadenopathy:    He has no cervical adenopathy.  Neurological: He is alert and oriented to person, place, and time. He has normal reflexes. No cranial nerve deficit. He  exhibits normal muscle tone. He displays a negative Romberg sign. Coordination and gait normal.  Skin: Skin is warm and dry. No rash noted.  Psychiatric: He has a normal mood and affect. His behavior is normal. Judgment and thought content normal.    Lab Results  Component Value Date   WBC 6.9 07/30/2015   HGB 14.9 07/30/2015   HCT 43.5 07/30/2015   PLT 244.0 07/30/2015   GLUCOSE 88 07/30/2015   CHOL 176 07/30/2015   TRIG 238.0* 07/30/2015   HDL 41.70 07/30/2015   LDLDIRECT 90.0 07/30/2015   LDLCALC 87 12/30/2014   ALT 31 07/30/2015   AST 21 07/30/2015   NA 137 07/30/2015   K 4.2 07/30/2015   CL 105 07/30/2015   CREATININE 1.02 07/30/2015   BUN 16 07/30/2015   CO2 26 07/30/2015   TSH 1.64 07/30/2015   PSA 0.52 07/30/2015   HGBA1C 5.7 07/30/2015    Dg Chest 2 View  07/09/2015  CLINICAL DATA:  Mid chest pain with shortness of breath for 3 days, nonsmoker EXAM: CHEST  2 VIEW COMPARISON:  12/30/2014 FINDINGS: The heart size and vascular pattern normal. No pleural effusion or pneumothorax. Bony thorax intact. Right lung is clear. Minimal opacity left lung base. IMPRESSION: Minimal opacity in the left lung base seen on the PA view. This is most consistent with mild atelectasis. Infectious infiltrate is possible but considered distinctly less likely. Consider follow-up imaging if symptoms persist. Electronically Signed   By: Skipper Cliche M.D.   On: 07/09/2015 14:04    Assessment & Plan:   There are no diagnoses linked to this encounter. I am having Mr. Seely maintain his Omega-3 Fatty Acids (FISH OIL PO), aspirin EC, flecainide, atorvastatin, cholecalciferol, losartan, nitroGLYCERIN, predniSONE, doxazosin, Icosapent Ethyl, and finasteride.  No orders of the defined types were placed in this encounter.     Follow-up: No Follow-up on file.  Walker Kehr, MD

## 2015-11-19 ENCOUNTER — Encounter: Payer: Self-pay | Admitting: Internal Medicine

## 2015-11-20 ENCOUNTER — Encounter: Payer: Self-pay | Admitting: Internal Medicine

## 2015-11-24 ENCOUNTER — Encounter: Payer: Self-pay | Admitting: Internal Medicine

## 2015-11-25 ENCOUNTER — Other Ambulatory Visit: Payer: Self-pay | Admitting: Internal Medicine

## 2015-11-25 ENCOUNTER — Encounter: Payer: Self-pay | Admitting: Internal Medicine

## 2015-11-25 DIAGNOSIS — R05 Cough: Secondary | ICD-10-CM

## 2015-11-25 DIAGNOSIS — R059 Cough, unspecified: Secondary | ICD-10-CM

## 2015-11-25 NOTE — Telephone Encounter (Signed)
Needs a letter Done. He can pick it up or we can mail it. Thx

## 2015-11-28 ENCOUNTER — Ambulatory Visit (INDEPENDENT_AMBULATORY_CARE_PROVIDER_SITE_OTHER): Payer: BLUE CROSS/BLUE SHIELD | Admitting: Pulmonary Disease

## 2015-11-28 ENCOUNTER — Encounter: Payer: Self-pay | Admitting: Pulmonary Disease

## 2015-11-28 VITALS — BP 118/68 | HR 88 | Ht 69.0 in | Wt 199.2 lb

## 2015-11-28 DIAGNOSIS — R059 Cough, unspecified: Secondary | ICD-10-CM

## 2015-11-28 DIAGNOSIS — J4520 Mild intermittent asthma, uncomplicated: Secondary | ICD-10-CM | POA: Diagnosis not present

## 2015-11-28 DIAGNOSIS — R0609 Other forms of dyspnea: Secondary | ICD-10-CM | POA: Diagnosis not present

## 2015-11-28 DIAGNOSIS — J45909 Unspecified asthma, uncomplicated: Secondary | ICD-10-CM | POA: Insufficient documentation

## 2015-11-28 DIAGNOSIS — K219 Gastro-esophageal reflux disease without esophagitis: Secondary | ICD-10-CM

## 2015-11-28 DIAGNOSIS — R05 Cough: Secondary | ICD-10-CM

## 2015-11-28 DIAGNOSIS — R0600 Dyspnea, unspecified: Secondary | ICD-10-CM | POA: Insufficient documentation

## 2015-11-28 MED ORDER — BUDESONIDE-FORMOTEROL FUMARATE 160-4.5 MCG/ACT IN AERO: 2.0000 | INHALATION_SPRAY | Freq: Two times a day (BID) | RESPIRATORY_TRACT | Status: DC

## 2015-11-28 MED ORDER — ALBUTEROL SULFATE HFA 108 (90 BASE) MCG/ACT IN AERS: 2.0000 | INHALATION_SPRAY | Freq: Four times a day (QID) | RESPIRATORY_TRACT | Status: DC | PRN

## 2015-11-28 MED ORDER — OMEPRAZOLE MAGNESIUM 20 MG PO TBEC: 40.0000 mg | DELAYED_RELEASE_TABLET | Freq: Every day | ORAL | Status: DC

## 2015-11-28 NOTE — Assessment & Plan Note (Signed)
Treat as asthma. Stress test was (-). Pt needs a LHC > he is holding off. May need PFT on f/u.

## 2015-11-28 NOTE — Assessment & Plan Note (Signed)
Cough since 06/2015. Possible post viral cough. Denies UACS. Has mild GERD. Work (with exposure to propane) makes cough worse. Not been on Abx.  Pred x 1 week > not better. Wheezing now. Possible asthmatic bronchitis.  CXR (10/2015)  No infiltrate.   Plan : 1. symbicort 160/4.5 2P BID. 2. Alb 2 P q4 prn 3. Prilosec 40 mg/hs. 4. Will hold off nasal steroid and allergy med.  5. Pt to call if not better. 6. May need pft.

## 2015-11-28 NOTE — Assessment & Plan Note (Signed)
Start PPI at HS.

## 2015-11-28 NOTE — Assessment & Plan Note (Signed)
Recent cough. Possible asthmatic bronchitis. Start symbicort. Alb prn.

## 2015-11-28 NOTE — Patient Instructions (Signed)
It was a pleasure taking care of you today!  Your cough is most likely multifactorial. Likely related to the following:  A. GERD  Start Prilosec 40 mg/tab, 1 tab at bedtime.   Advised on dietary changes. Avoid food that will make your reflux worse.   Try to keep head of bed 30 degrees elevated.  Wait 2 hours at least after your last meal before you lie down B. Asthma-type reaction, allergies  Start Symbicort 160/4.5  2puffs 2x/day. Rinse mouth with each use.   We will try this plan and see if your cough gets better. Please call the office if your cough worsens while on treatment.   Return to clinic in 6-8 weeks with Dr. Corrie Dandy or with an NP.

## 2015-11-28 NOTE — Progress Notes (Signed)
Subjective:    Patient ID: [name], male    DOB: [birth year], 60 y.o.   MRN: [id number]  HPI   This is the case of [name], 60 y.o. Male, who was referred by Dr. Walker Kehr in consultation regarding cough.   As you very well know, patient is a non smoker. Not been diagnosed with asthma or copd.  Pt is originally from [place], has been in [place] almost 18 yrs.  No h/o allergies or sinus issues or chronic cough until start of the year.   Pt has been coughing since 06/2015 -- not sure what triggers are. He coughs on and off, work and at home. Not necessarily worse at sleep. Dry cough, some mucus sometimes. Cough better usually by itself.  Same cough severity and frequency x 6 mos.    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 He works 2-3 x/week. Does night shift from 11pm-8 am. His cough is mildly worse at work,   Cough did NOT start of after an infection. Was Rx abx but he did NOT take it. Took pred for a week and was not better.   Pt with exertional dyspnea at work x 6-7 mos.   Stess test in 06/2015 was N. Pt does not want LHC despite being advised this.   Has GERD. Not on meds.    Has lived at current house x 9yrs. (-) pets. (-) carpets, molds, stagnant water. (-) hot tub use.  (-) Recent ravel. No new meds.     Review of Systems  Constitutional: Negative.  Negative for fever and unexpected weight change.  HENT: Negative for congestion, dental problem, ear pain, nosebleeds, postnasal drip, rhinorrhea, sinus pressure, sneezing, sore throat and trouble swallowing.   Eyes: Negative.  Negative for redness and itching.  Respiratory: Positive for cough, chest tightness and shortness of breath. Negative for wheezing.   Cardiovascular: Negative.  Negative for palpitations and leg swelling.  Gastrointestinal: Negative.  Negative for nausea and vomiting.  Endocrine: Negative.   Genitourinary:  Negative.  Negative for dysuria.  Musculoskeletal: Positive for arthralgias. Negative for joint swelling.  Skin: Negative.  Negative for rash.  Allergic/Immunologic: Negative.   Neurological: Positive for headaches.  Hematological: Negative.  Does not bruise/bleed easily.  Psychiatric/Behavioral: Negative.  Negative for dysphoric mood. The patient is not nervous/anxious.    Past Medical History  Diagnosis Date  . Hyperlipidemia   . GERD (gastroesophageal reflux disease)   . Headache(784.0)   . URI (upper respiratory infection)    (-) CA, DVT  Family History  Problem Relation Age of Onset  . Coronary artery disease Father   . Diabetes Father   . Heart disease Father   . Liver disease Mother   . Kidney disease Mother   . Heart disease Mother   . Coronary artery disease Mother   . Colon cancer Neg Hx   . Heart attack Mother   . Hypertension Mother   . Heart attack Maternal Grandfather   . Stroke Neg Hx      Past Surgical History  Procedure Laterality Date  . Inguinal hernia repair      Social History   Social History  . Marital Status: Divorced    Spouse Name: N/A  . Number of Children: 1  . Years of Education: N/A   Occupational History  . Animal nutritionist    Social History Main Topics  .  Smoking status: Never Smoker   . Smokeless tobacco: Never Used  . Alcohol Use: No     Comment: rare  . Drug Use: No  . Sexual Activity: No   Other Topics Concern  . Not on file   Social History Narrative   Coffee daily    Divorced with 1 son. Stays with mother and son. (-) smoke. (-) ETOH. Works at [employer].   Allergies  Allergen Reactions  . Atorvastatin     REACTION: leg cramps per patient with a large dose  . Flomax [Tamsulosin Hcl]     Hard time breathing     Outpatient Prescriptions Prior to Visit  Medication Sig Dispense Refill  . aspirin EC 81 MG tablet Take 81 mg by mouth daily.    Marland Kitchen atorvastatin (LIPITOR) 10 MG tablet Take 1 tablet (10 mg total) by  mouth daily. 30 tablet 11  . cholecalciferol (VITAMIN D) 1000 UNITS tablet Take 1 tablet (1,000 Units total) by mouth daily. 100 tablet 3  . doxazosin (CARDURA) 8 MG tablet Take 0.5 tablets (4 mg total) by mouth 2 (two) times daily. 90 tablet 1  . finasteride (PROSCAR) 5 MG tablet Take 1 tablet (5 mg total) by mouth daily. 100 tablet 3  . flecainide (TAMBOCOR) 100 MG tablet Take 1 tablet (100 mg total) by mouth as needed (Take 2 tablets as needed for palpitations lasting longer than 5 mintues.). 30 tablet 3  . Icosapent Ethyl (VASCEPA) 1 g CAPS Take 2 capsules by mouth 2 (two) times daily. 120 capsule 11  . losartan (COZAAR) 100 MG tablet Take 1 tablet (100 mg total) by mouth daily. 30 tablet 11  . nitroGLYCERIN (NITROSTAT) 0.4 MG SL tablet Place 1 tablet (0.4 mg total) under the tongue every 5 (five) minutes as needed for chest pain. 25 tablet 2  . Omega-3 Fatty Acids (FISH OIL PO) Take 1 capsule by mouth daily.    . promethazine-codeine (PHENERGAN WITH CODEINE) 6.25-10 MG/5ML syrup Take 5 mLs by mouth every 4 (four) hours as needed. 300 mL 0   No facility-administered medications prior to visit.   Meds ordered this encounter  Medications  . budesonide-formoterol (SYMBICORT) 160-4.5 MCG/ACT inhaler    Sig: Inhale 2 puffs into the lungs 2 (two) times daily.    Dispense:  1 Inhaler    Refill:  5  . albuterol (PROVENTIL HFA;VENTOLIN HFA) 108 (90 Base) MCG/ACT inhaler    Sig: Inhale 2 puffs into the lungs every 6 (six) hours as needed for wheezing or shortness of breath.    Dispense:  1 Inhaler    Refill:  5  . omeprazole (PRILOSEC OTC) 20 MG tablet    Sig: Take 2 tablets (40 mg total) by mouth daily.    Dispense:  60 tablet    Refill:  5          Objective:   Physical Exam   Vitals:  Filed Vitals:   11/28/15 1112  BP: 118/68  Pulse: 88  Height: 5\' 9"  (1.753 m)  Weight: 199 lb 3.2 oz (90.357 kg)  SpO2: 92%    Constitutional/General:  Pleasant, well-nourished,  well-developed, not in any distress,  Comfortably seating.  Well kempt  Body mass index is 29.4 kg/(m^2). Wt Readings from Last 3 Encounters:  11/28/15 199 lb 3.2 oz (90.357 kg)  11/18/15 203 lb (92.08 kg)  08/04/15 200 lb (90.719 kg)    Neck circumference:   HEENT: Pupils equal and reactive to light and accommodation. Anicteric sclerae.  Normal nasal mucosa.   No oral  lesions,  mouth clear,  oropharynx clear, no postnasal drip. (-) Oral thrush. No dental caries.  Airway - Mallampati class III  Neck: No masses. Midline trachea. No JVD, (-) LAD. (-) bruits appreciated.  Respiratory/Chest: Grossly normal chest. (-) deformity. (-) Accessory muscle use.  Symmetric expansion. (-) Tenderness on palpation.  Resonant on percussion.  Diminished BS on both lower lung zones. (-) , crackles, rhonchi. Occasional wheezing.  (-) egophony  Cardiovascular: Regular rate and  rhythm, heart sounds normal, no murmur or gallops, no peripheral edema  Gastrointestinal:  Normal bowel sounds. Soft, non-tender. No hepatosplenomegaly.  (-) masses.   Musculoskeletal:  Normal muscle tone. Normal gait.   Extremities: Grossly normal. (-) clubbing, cyanosis.  (-) edema  Skin: (-) rash,lesions seen.   Neurological/Psychiatric : alert, oriented to time, place, person. Normal mood and affect           Assessment & Plan:  Cough Cough since 06/2015. Possible post viral cough. Denies UACS. Has mild GERD. Work (with exposure to propane) makes cough worse. Not been on Abx.  Pred x 1 week > not better. Wheezing now. Possible asthmatic bronchitis.  CXR (10/2015)  No infiltrate.   Plan : 1. symbicort 160/4.5 2P BID. 2. Alb 2 P q4 prn 3. Prilosec 40 mg/hs. 4. Will hold off nasal steroid and allergy med.  5. Pt to call if not better. 6. May need pft.   GERD Start PPI at HS.   Asthma Recent cough. Possible asthmatic bronchitis. Start symbicort. Alb prn.   Exertional dyspnea Treat as  asthma. Stress test was (-). Pt needs a LHC > he is holding off. May need PFT on f/u.     I personally reviewed previous images (Chest Xray, Chest Ct scan) done on this patient. I reviewed the reports on the images as well.     Thank you very much for letting me participate in this patient's care. Please do not hesitate to give me a call if you have any questions or concerns regarding the treatment plan.   Patient will follow up with me in 2 mos.     Monica Becton, MD 11/28/2015   2:52 PM Pulmonary and [place] Pager: [phone] Office: [phone], Fax: [phone]

## 2015-12-04 ENCOUNTER — Telehealth: Payer: Self-pay

## 2015-12-04 NOTE — Telephone Encounter (Signed)
[phone] Please give them a call this is work related. They have some questions. Thank you.

## 2015-12-04 NOTE — Telephone Encounter (Signed)
i called this number, it is the [employer]---i held on line in cue for 30 min until 5pm, i was never helped by rep---i'm not sure what this patient needs from this company---i called patient, he did not request this service---i gave the patient this phone number and patient is going to call this 855 area code number to see if he can figure out who called [place] or what information they need from [place]

## 2015-12-08 ENCOUNTER — Encounter: Payer: Self-pay | Admitting: Pulmonary Disease

## 2015-12-08 ENCOUNTER — Telehealth: Payer: Self-pay | Admitting: Pulmonary Disease

## 2015-12-08 ENCOUNTER — Ambulatory Visit (INDEPENDENT_AMBULATORY_CARE_PROVIDER_SITE_OTHER): Payer: BLUE CROSS/BLUE SHIELD | Admitting: Pulmonary Disease

## 2015-12-08 VITALS — BP 94/62 | HR 73 | Ht 69.0 in | Wt 201.2 lb

## 2015-12-08 DIAGNOSIS — R05 Cough: Secondary | ICD-10-CM | POA: Diagnosis not present

## 2015-12-08 DIAGNOSIS — J4520 Mild intermittent asthma, uncomplicated: Secondary | ICD-10-CM | POA: Diagnosis not present

## 2015-12-08 DIAGNOSIS — K219 Gastro-esophageal reflux disease without esophagitis: Secondary | ICD-10-CM | POA: Diagnosis not present

## 2015-12-08 DIAGNOSIS — R059 Cough, unspecified: Secondary | ICD-10-CM

## 2015-12-08 MED ORDER — GUAIFENESIN-DM 100-10 MG/5ML PO SYRP: 5.0000 mL | ORAL_SOLUTION | ORAL | Status: DC | PRN

## 2015-12-08 MED ORDER — LORATADINE 10 MG PO TABS: 10.0000 mg | ORAL_TABLET | Freq: Every day | ORAL | Status: DC

## 2015-12-08 NOTE — Patient Instructions (Addendum)
1.  Please use Claritin as prescribed to see if this helps with your cough 2.  Continue Symbicort - rinse your mouth after each use with water 3.  We will schedule you for PFT's (pulmonary function tests).  You have a number on the back of your insurance card that you can call to determine if the procedure will be covered.   4.  Continue prilosec for reflux 5.  We will schedule you for a follow up appointment after your PFT's are completed 6.  Discuss wearing a mask with your employer while performing your job.   7.  Use the guaifenesin as prescribed to thin any secretions (this may help you cough up sputum) 8.  Please call if new or worsening symptoms.  9.  If all of the above does not improve cough, we may need to further investigate your heart.

## 2015-12-08 NOTE — Telephone Encounter (Signed)
Per Velna Hatchet, this will need to be scheduled sooner if possible.  Please advise Sheena where the patient can be double booked in the next month.  Pt will need PFT's prior to appt.  PFT's may have to be done at [facility] if nothing is available here on same day as OV.  Thanks.

## 2015-12-08 NOTE — Progress Notes (Signed)
Current Outpatient Prescriptions on File Prior to Visit  Medication Sig  . albuterol (PROVENTIL HFA;VENTOLIN HFA) 108 (90 Base) MCG/ACT inhaler Inhale 2 puffs into the lungs every 6 (six) hours as needed for wheezing or shortness of breath.  Marland Kitchen aspirin EC 81 MG tablet Take 81 mg by mouth daily.  Marland Kitchen atorvastatin (LIPITOR) 10 MG tablet Take 1 tablet (10 mg total) by mouth daily.  . budesonide-formoterol (SYMBICORT) 160-4.5 MCG/ACT inhaler Inhale 2 puffs into the lungs 2 (two) times daily.  . cholecalciferol (VITAMIN D) 1000 UNITS tablet Take 1 tablet (1,000 Units total) by mouth daily.  Marland Kitchen doxazosin (CARDURA) 8 MG tablet Take 0.5 tablets (4 mg total) by mouth 2 (two) times daily.  Marland Kitchen losartan (COZAAR) 100 MG tablet Take 1 tablet (100 mg total) by mouth daily.  . nitroGLYCERIN (NITROSTAT) 0.4 MG SL tablet Place 1 tablet (0.4 mg total) under the tongue every 5 (five) minutes as needed for chest pain.  . Omega-3 Fatty Acids (FISH OIL PO) Take 1 capsule by mouth daily.  Marland Kitchen omeprazole (PRILOSEC OTC) 20 MG tablet Take 2 tablets (40 mg total) by mouth daily.   No current facility-administered medications on file prior to visit.     Chief Complaint  Patient presents with  . Acute Visit    Pt c/o cough. Denies any heavy mucus production. Pt reports having coughed up mucus 2 times, very little. Pt has had cough since Jan 2017. Pt works around heavy fumes. CXR in Jan showed some abnormality - pt was given Pred and abx at that time but the patient only took the Pred. With exertion he has some pain in chest. Symbicort not helping     Tests 07/15/15  NM Stress Test >>   Nuclear stress EF: 66%.  There was no ST segment deviation noted during stress.  The study is normal.  The left ventricular ejection fraction is normal (55-65%).  1. No evidence for ischemia or infarction (count-poor rest study).  2. Normal LV systolic function and wall motion.  3. Normal study.   11/18/15  CXR >> no acute  abnormality, cardiac silhouette within normal limits  Past medical hx  has a past medical history of Hyperlipidemia; GERD (gastroesophageal reflux disease); Headache(784.0); and URI (upper respiratory infection).   Past surgical hx, Allergies, Family hx, Social hx all reviewed.  Vital Signs BP 94/62 mmHg  Pulse 73  Ht 5\' 9"  (1.753 m)  Wt 201 lb 3.2 oz (91.264 kg)  BMI 29.70 kg/m2  SpO2 92%  History of Present Illness:  INTERPRETER UTILIZED FOR INTERACTION [name] (originally from [name], in [place] 18 years) is a 60 y.o. male, non-smoker, with a PMH of GERD, asthma, known work exposures to propane and exertional dyspnea (negative stress test 07/15/15, pt holding off of LHC)  who presents to the Pulmonary office on 6/19 with concerns for ongoing cough.   His mother requested the appointment for him.    At baseline, he is on symbicort, prilosec QHS and PRN albuterol.  He has been taking the medications as prescribed (specificically questioned him asking him to explain and he does so correctly)  He reports he continues to have ongoing cough that has not responded to symbicort or prilosec.  He feels he does not have asthma or consistent reflux.  He denies seasonal allergies, post-nasal drip, sore throat.  "I just want this to be fixed". He reports when he sleeps, his cough usually does not bother him - no waking with cough  etc.  Cough is worse in the am upon waking.  He does note a "whistling" occasionally.  The cough is predominately dry, non-productive.  He has had two episodes since January where he cough up a small amount of sputum.  He denies fevers, chills, difficulty breathing / SOB.  He is concerned that his work is making his cough worse.  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    Physical Exam  General - well developed adult  in no acute distress ENT - No sinus tenderness, no oral exudate, no LAN Cardiac - s1s2 regular, no murmur Chest - even/non-labored, lungs bilaterally clear. No wheeze/rales.  Harsh, dry, non-productive cough.   Back - No focal tenderness Abd - Soft, non-tender Ext - No edema Neuro - Normal strength Skin - No rashes Psych - normal mood, and behavior   Assessment/Plan  1.  Cough - persistent dry, non-productive cough.  On symbicort for asthma and prilosec for GERD without relief.  Negative CXR as recent as 11/18/15.  Negative stress test, has refused LHC in the past.   Plan: Add daily claritin in the event allergies are contributing Assess PFT's to determine if asthma is contributing factor (none at baseline) Have asked the patient to use claritin daily to see if there is an allergic component to cough Guaifenesin trial  Discussed breaking the cough cycle with the patient May need to pursue LHC if PFT's are negative  2.  Asthma  Plan: Continue symbicort, PRN albuterol Discussed rinsing mouth after each use Follow up with Dr. Corrie Dandy after PFT's completed   4.  GERD   Plan: Continue prilosec for GERD  Patient Instructions  1.  Please use Claritin as prescribed to see if this helps with your cough 2.  Continue Symbicort - rinse your mouth after each use with water 3.  We will schedule you for PFT's (pulmonary function tests).  You have a number on the back of your insurance card that you can call to determine if the procedure will be covered.   4.  Continue prilosec for reflux 5.  We will schedule you for a follow up appointment after your PFT's are completed 6.  Discuss wearing a mask with your employer while performing your job.   7.  Use the guaifenesin as prescribed to thin any secretions (this may help you cough up sputum) 8.  Please call if new or worsening symptoms.  9.  If all of the above does not improve cough, we may need to further investigate your heart.         Noe Gens, NP-C [place]  [phone] 12/08/2015, 3:12 PM

## 2015-12-08 NOTE — Telephone Encounter (Signed)
Pt OV rescheduled to 12/31/15 @ 4:30pm. PFT ordered for [facility]. PCC's will call to schedule. Pt aware.

## 2015-12-09 ENCOUNTER — Encounter: Payer: Self-pay | Admitting: Pulmonary Disease

## 2015-12-09 NOTE — Telephone Encounter (Signed)
PCC's- Pt is asking about a PA on his upcoming PFT on 6/22.  This is being done in the office.  Is there any kind of PA needing to be done on this?  Thanks!

## 2015-12-11 ENCOUNTER — Telehealth: Payer: Self-pay | Admitting: Pulmonary Disease

## 2015-12-11 NOTE — Telephone Encounter (Signed)
Pt came in for his PFT today. PFT was attempted but could not be completed. Due to the language barrier and having to rely on a interpreter to translate  the instructions on how to perform the PFT, the pt could not complete the PFT. Pt will not be charged for this.  This is an Pharmacist, hospital for Dr. Corrie Dandy.

## 2015-12-12 ENCOUNTER — Other Ambulatory Visit: Payer: Self-pay | Admitting: Internal Medicine

## 2015-12-12 ENCOUNTER — Telehealth: Payer: Self-pay | Admitting: Pulmonary Disease

## 2015-12-12 DIAGNOSIS — R079 Chest pain, unspecified: Secondary | ICD-10-CM

## 2015-12-12 NOTE — Telephone Encounter (Signed)
Patient states that he is having trouble with cough, chest tightness, SOB. Patient says that the the cough syrup did not help.  Patient was unable to do PFT yesterday. Wants to know what more needs to be done since he could not do PFT?   (Pt speaks [place], hard to understand)  Pharmacy: [facility]  Dr. Melvyn Novas, please advise in Dr. Corrie Dandy absence.  Allergies  Allergen Reactions  . Atorvastatin     REACTION: leg cramps per patient with a large dose  . Flomax [Tamsulosin Hcl]     Hard time breathing   Patient Instructions     1. Please use Claritin as prescribed to see if this helps with your cough 2. Continue Symbicort - rinse your mouth after each use with water 3. We will schedule you for PFT's (pulmonary function tests). You have a number on the back of your insurance card that you can call to determine if the procedure will be covered.  4. Continue prilosec for reflux 5. We will schedule you for a follow up appointment after your PFT's are completed 6. Discuss wearing a mask with your employer while performing your job.  7. Use the guaifenesin as prescribed to thin any secretions (this may help you cough up sputum) 8. Please call if new or worsening symptoms.  9. If all of the above does not improve cough, we may need to further investigate your heart.

## 2015-12-12 NOTE — Telephone Encounter (Signed)
I called pt and someone answered but sounded very muffled and could not hear them  Then a loud ringing sound started and I ended the call  Called back and line ws busy

## 2015-12-12 NOTE — Telephone Encounter (Signed)
Nothing else to offer over the phone I would be happy to see next week with all meds in hand if not able to regroup with Dr Corrie Dandy then

## 2015-12-12 NOTE — Telephone Encounter (Signed)
Spoke with the pt's grandson and scheduled appt with MW for Tues 6/27 at 12 noon

## 2015-12-16 ENCOUNTER — Ambulatory Visit (INDEPENDENT_AMBULATORY_CARE_PROVIDER_SITE_OTHER): Payer: BLUE CROSS/BLUE SHIELD | Admitting: Internal Medicine

## 2015-12-16 ENCOUNTER — Encounter: Payer: Self-pay | Admitting: Internal Medicine

## 2015-12-16 VITALS — BP 106/80 | HR 82 | Ht 69.0 in | Wt 198.0 lb

## 2015-12-16 DIAGNOSIS — J45991 Cough variant asthma: Secondary | ICD-10-CM | POA: Insufficient documentation

## 2015-12-16 DIAGNOSIS — I10 Essential (primary) hypertension: Secondary | ICD-10-CM | POA: Diagnosis not present

## 2015-12-16 MED ORDER — NEBIVOLOL HCL 10 MG PO TABS: 10.0000 mg | ORAL_TABLET | Freq: Every day | ORAL | Status: DC

## 2015-12-16 MED ORDER — FAMOTIDINE 20 MG PO TABS: ORAL_TABLET | ORAL | Status: DC

## 2015-12-16 MED ORDER — MOMETASONE FURO-FORMOTEROL FUM 100-5 MCG/ACT IN AERO: INHALATION_SPRAY | RESPIRATORY_TRACT | Status: DC

## 2015-12-16 NOTE — Patient Instructions (Addendum)
Try dulera 100 Take 2 puffs first thing in am and then another 2 puffs about 12 hours later.  X 2 weeks then stop if not better > if working, continue it.  Work on inhaler technique:  relax and gently blow all the way out then take a nice smooth deep breath back in, triggering the inhaler at same time you start breathing in.  Hold for up to 5 seconds if you can. Blow out thru nose. Rinse and gargle with water when done    Stop cozar and take bystolic 10  mg one daily until return  Try prilosec 40 mg mg  Take 30-60 min before first meal of the day and Pepcid ac (famotidine) 20 mg one @  bedtime until cough is completely gone for at least a week without the need for cough suppression  GERD (REFLUX)  is an extremely common cause of respiratory symptoms just like yours , many times with no obvious heartburn at all.    It can be treated with medication, but also with lifestyle changes including elevation of the head of your bed (ideally with 6 inch  bed blocks),  Smoking cessation, avoidance of late meals, excessive alcohol, and avoid fatty foods, chocolate, peppermint, colas, red wine, and acidic juices such as orange juice.  NO MINT OR MENTHOL PRODUCTS SO NO COUGH DROPS  USE SUGARLESS CANDY INSTEAD (Jolley ranchers or Stover's or Life Savers) or even ice chips will also do - the key is to swallow to prevent all throat clearing. NO OIL BASED VITAMINS - use powdered substitutes.- NO FISH OIL  Keep previous appt to see Dr Corrie Dandy and bring all medications with you   --------------------------------------  ?????????? dulera 100 ???????? 2 ??????? ?????? ???? ? am, ? ????? ??? 2 ??????? ???????? ????? 12 ?????. X 2 ??????, ????? ??????????, ???? ?? ?????> ???? ?????????, ???????????.  ????????? ??? ???????? ??????????: ???????????? ? ????????? ???????? ???? ????, ????? ????? ???????? ???????? ??????? ???????? ???????, ???????? ????????? ???????????? ? ???, ??? ?? ????????? ???????. ??????????? ?  ??????? 5 ??????, ???? ???????. ?????? ???. ?????????? ? ?????????? ?????  ?????????? ????? ? ?????????? bystolic 10 ?? ???? ??? ? ???? ?? ???????????  ?????????? ???????? 40 ?? ??. ???????? 30-60 ????? ????? ?????? ??????? ???? ? Pepcid ac (?????????) 20 ?? ???? ??? ? ???????, ???? ?????? ?? ???????? ? ??????? ?? ??????? ???? ?????? ??? ????????????? ?????????? ?????  GERD (REFLUX) - ??????????? ???????????????? ??????? ????????????? ?????????, ???????? ?????, ????? ??? ??? ????? ??????.  ??? ????? ?????? ???????????, ?? ????? ? ??????????? ?????? ?????, ??????? ?????????? ?????? ????? ??????? (? ?????? ? ??????? ?? 6 ??????), ??????????? ???????, ????????? ???????? ?????? ????, ?????????? ???????? ? ????????? ?????? ????, ????????, ????, ???? , ??????? ???? ? ????????? ????, ????? ??? ???????????? ???. ??????? ?????? ??? ?????????? ???????? ??????? ?????  ??????????? ?????? ???????? ?????? (Jelley ranchhers ??? Stover's ??? Life Savers) ??? ???? ??????? ????? ????? - ???? ?????? ??????????, ????? ????????????? ??? ??????? ?????. ??? ????? ?? ?????? ????????? - ??????????? ??????????????? ??????????. - ??? ??????? ?????  ??????? ?????????? ??????, ????? ??????? ??????? ?? ????? ? ???????? ? ????? ??? ????????? 19/17

## 2015-12-16 NOTE — Progress Notes (Signed)
Tests 07/15/15  NM Stress Test >>   Nuclear stress EF: 66%.  There was no ST segment deviation noted during stress.  The study is normal.  The left ventricular ejection fraction is normal (55-65%).  1. No evidence for ischemia or infarction (count-poor rest study).  2. Normal LV systolic function and wall motion.  3. Normal study.   11/18/15  CXR >> no acute abnormality, cardiac silhouette within normal limits  Past medical hx  has a past medical history of Hyperlipidemia; GERD (gastroesophageal reflux disease); Headache(784.0); and URI (upper respiratory infection).   Past surgical hx, Allergies, Family hx, Social hx all reviewed.     History of Present Illness:  INTERPRETER UTILIZED FOR INTERACTION  11/28/15 [facility] eval rec Your cough is most likely multifactorial. Likely related to the following:  A. GERD  Start Prilosec 40 mg/tab, 1 tab at bedtime.   Advised on dietary changes. Avoid food that will make your reflux worse.   Try to keep head of bed 30 degrees elevated.  Wait 2 hours at least after your last meal before you lie down B. Asthma-type reaction, allergies  Start Symbicort 160/4.5  2puffs 2x/day. Rinse mouth with each use.   NP ov 12/08/15  [name] (originally from [place], in [place] 18 years) is a 60 y.o. male, never smoker , with a PMH of GERD, asthma, known work exposures to propane exhaust and exertional dyspnea (negative stress test 07/15/15, pt holding off of LHC)  who presents to the Pulmonary office on 6/19 with concerns for ongoing cough.   His mother requested the appointment for him.    At baseline, he is on symbicort, prilosec QHS and PRN albuterol.  He has been taking the medications as prescribed (specificically questioned him asking him to explain and he does so correctly)  He reports he continues to have ongoing cough that has not responded to symbicort or prilosec.  He feels he does not have asthma or consistent reflux.  He  denies seasonal allergies, post-nasal drip, sore throat.  "I just want this to be fixed". He reports when he sleeps, his cough usually does not bother him - no waking with cough etc.  Cough is worse in the am upon waking.  He does note a "whistling" occasionally.  The cough is predominately dry, non-productive.  He has had two episodes since January where he cough up a small amount of sputum.  He denies fevers, chills, difficulty breathing / SOB.  He is concerned that his work is making his cough worse.  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   rec 1.  Please use Claritin as prescribed to see if this helps with your cough 2.  Continue Symbicort - rinse your mouth after each use with water 3.  We will schedule you for PFT's (pulmonary function tests).  You have a number on the back of your insurance card that you can call to determine if the procedure will be covered.   4.  Continue prilosec for reflux 5.  We will schedule you for a follow up appointment after your PFT's are completed 6.  Discuss wearing a mask with your employer while performing your job.   7.  Use the guaifenesin as prescribed to thin any secretions (this may help you cough up sputum)  8.  Please call if new or worsening symptoms.  9.  If all of the above does not improve cough, we may need to further investigate your heart.     12/16/2015 acute extended ov/[name] re: persistent cough  Chief Complaint  Patient presents with  . Acute Visit    Pt c/o increased cough since last visit. Cough is non prod and seems to be worse when he is working cleaning floors.   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   No obvious other patterns in day to day or daytime variability or assoc excess/ purulent sputum or mucus plugs or hemoptysis or cp or chest tightness, subjective wheeze or overt sinus or hb symptoms. No unusual exp hx or h/o childhood pna/ asthma or knowledge of premature birth.  Sleeping ok without nocturnal  or early am exacerbation  of respiratory  c/o's or need for noct saba. Also denies any obvious fluctuation of symptoms with weather or environmental changes or other aggravating or alleviating factors except as outlined above   Current Medications, Allergies, Complete Past Medical History, Past Surgical History, Family History, and Social History were reviewed in [facility] record.  ROS  The following are not active complaints unless bolded sore throat, dysphagia, dental problems, itching, sneezing,  nasal congestion or excess/ purulent secretions, ear ache,   fever, chills, sweats, unintended wt loss, classically pleuritic or exertional cp,  orthopnea pnd or leg swelling, presyncope, palpitations, abdominal pain, anorexia, nausea, vomiting, diarrhea  or change in bowel or bladder habits, change in stools or urine, dysuria,hematuria,  rash, arthralgias, visual complaints, headache, numbness, weakness or ataxia or problems with walking or coordination,  change in mood/affect or memory.           Physical Exam  Wt Readings from Last 3 Encounters:  12/16/15 198 lb (89.812 kg)  12/08/15 201 lb 3.2 oz (91.264 kg)  11/28/15 199 lb 3.2 oz (90.357 kg)    Vital signs reviewed   General - well developed adult in no acute distress ENT - No sinus tenderness, no oral exudate, no LAN Cardiac - s1s2 regular, no murmur Chest - even/non-labored, lungs bilaterally clear. No wheeze/rales.  Harsh, dry, non-productive cough.   Back - No focal tenderness Abd - Soft, non-tender Ext - No edema Neuro - Normal strength Skin - No  rashes Psych - normal mood, and behavior   I personally reviewed images and agree with radiology impression as follows:  CXR:  11/18/15 No evidence of acute cardiopulmonary abnormality. No evidence of Pneumonia.    Assessment/Plan

## 2015-12-16 NOTE — Assessment & Plan Note (Signed)
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   This has not been reported with exposure to the other ARB's to date, so it seems reasonable for now to try either generic diovan or avapro if ARB needed or use an alternative class altogether.  See:  Lelon Frohlich Allergy Asthma Immunol  2008: 101: p [id number]

## 2015-12-16 NOTE — Assessment & Plan Note (Signed)
I had an extended discussion with the patient reviewing all relevant studies completed to date and  lasting 25 minutes of a 40  minute acute extended  Visit with interpreter and mother   1) Lack of cough resolution on a verified empirical regimen could mean an alternative diagnosis (uacs, not asthma) , persistence of the disease state (eg sinusitis or bronchiectasis or reaction to cozar ) , or inadequacy of currently available therapy (eg no medical rx available for non-acid gerd)     2) The standardized cough guidelines published in Chest by Lissa Morales in 2006 are still the best available and consist of a multiple step process (up to 12!) , not a single office visit,  and are intended  to address this problem logically,  with an alogrithm dependent on response to empiric treatment at  each progressive step  to determine a specific diagnosis with  minimal addtional testing needed. Therefore if adherence is an issue or can't be accurately verified,  it's very unlikely the standard evaluation and treatment will be successful here.    Furthermore, response to therapy (other than acute cough suppression, which should only be used short term with avoidance of narcotic containing cough syrups if possible), can be a gradual process for which the patient is not likely to  perceive immediate benefit.  Unlike going to an eye doctor where the best perscription is almost always the first one and is immediately effective, this is almost never the case in the management of chronic cough syndromes. Therefore the patient needs to commit up front to consistently adhere to recommendations  for up to 6 weeks of therapy directed at the likely underlying problem(s) before the response can be reasonably evaluated.   3) needs rechallenge with low dose ICS.  - The proper method of use, as well as anticipated side effects, of a metered-dose inhaler are discussed and demonstrated to the patient. Improved effectiveness after  extensive coaching during this visit to a level of approximately 75 % from a baseline of 25 % so try dulera 100 2bid x 2 weeks  4) needs rx for both acid (ppi qam / h2 hs) and non acid (gerd diet/ no fish oil)  5) needs alternative for cozar (see separate a/p)     6) Each maintenance medication was reviewed in detail including most importantly the difference between maintenance and prns and under what circumstances the prns are to be triggered using an action plan format that is not reflected in the computer generated alphabetically organized AVS.    Please see instructions for details which were reviewed in writing in [place] and the patient reviewed also with interpreter and was given a copy highlighting the part that I personally wrote and discussed at today's ov.

## 2015-12-19 ENCOUNTER — Telehealth: Payer: Self-pay | Admitting: *Deleted

## 2015-12-19 NOTE — Telephone Encounter (Signed)
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 He wants to know if there is a lab test that can check for this?

## 2015-12-20 NOTE — Telephone Encounter (Signed)
There is no lab tests available for propane exposure Thx

## 2015-12-21 ENCOUNTER — Encounter: Payer: Self-pay | Admitting: Internal Medicine

## 2015-12-22 NOTE — Telephone Encounter (Signed)
Notified pt w/MD response. Mom also states when son takes the valsartan he becomes very wet/ light headed/ BP 134/78 (P) 71. Made f/u appt to discuss sxs w/MD 12/29/15 @ 9:30. ..[name]

## 2015-12-22 NOTE — Telephone Encounter (Signed)
See phone note pt was contacted this am spoke w/mom gave md response on lab testing for carbon exposure. Made appt for pt for next Monday 7/10 to discuss issues ...Johny Chess

## 2015-12-29 ENCOUNTER — Ambulatory Visit (INDEPENDENT_AMBULATORY_CARE_PROVIDER_SITE_OTHER): Payer: BLUE CROSS/BLUE SHIELD | Admitting: Internal Medicine

## 2015-12-29 ENCOUNTER — Ambulatory Visit: Payer: BLUE CROSS/BLUE SHIELD | Admitting: Acute Care

## 2015-12-29 ENCOUNTER — Other Ambulatory Visit (INDEPENDENT_AMBULATORY_CARE_PROVIDER_SITE_OTHER): Payer: BLUE CROSS/BLUE SHIELD

## 2015-12-29 ENCOUNTER — Encounter: Payer: Self-pay | Admitting: Internal Medicine

## 2015-12-29 VITALS — BP 90/60 | HR 82 | Wt 200.0 lb

## 2015-12-29 DIAGNOSIS — R0789 Other chest pain: Secondary | ICD-10-CM

## 2015-12-29 DIAGNOSIS — R61 Generalized hyperhidrosis: Secondary | ICD-10-CM

## 2015-12-29 DIAGNOSIS — J45991 Cough variant asthma: Secondary | ICD-10-CM

## 2015-12-29 DIAGNOSIS — R059 Cough, unspecified: Secondary | ICD-10-CM

## 2015-12-29 DIAGNOSIS — R05 Cough: Secondary | ICD-10-CM

## 2015-12-29 LAB — HEPATIC FUNCTION PANEL
ALBUMIN: 3.6 g/dL (ref 3.5–5.2)
ALK PHOS: 102 U/L (ref 39–117)
ALT: 33 U/L (ref 0–53)
AST: 18 U/L (ref 0–37)
BILIRUBIN DIRECT: 0.1 mg/dL (ref 0.0–0.3)
TOTAL PROTEIN: 6.9 g/dL (ref 6.0–8.3)
Total Bilirubin: 0.4 mg/dL (ref 0.2–1.2)

## 2015-12-29 LAB — CBC WITH DIFFERENTIAL/PLATELET
BASOS ABS: 0 10*3/uL (ref 0.0–0.1)
Basophils Relative: 0.5 % (ref 0.0–3.0)
EOS ABS: 0.2 10*3/uL (ref 0.0–0.7)
Eosinophils Relative: 2.5 % (ref 0.0–5.0)
HEMATOCRIT: 38.1 % — AB (ref 39.0–52.0)
HEMOGLOBIN: 13 g/dL (ref 13.0–17.0)
LYMPHS PCT: 21.5 % (ref 12.0–46.0)
Lymphs Abs: 1.7 10*3/uL (ref 0.7–4.0)
MCHC: 34.1 g/dL (ref 30.0–36.0)
MCV: 86.5 fl (ref 78.0–100.0)
MONOS PCT: 9.4 % (ref 3.0–12.0)
Monocytes Absolute: 0.8 10*3/uL (ref 0.1–1.0)
Neutro Abs: 5.3 10*3/uL (ref 1.4–7.7)
Neutrophils Relative %: 66.1 % (ref 43.0–77.0)
Platelets: 305 10*3/uL (ref 150.0–400.0)
RBC: 4.41 Mil/uL (ref 4.22–5.81)
RDW: 12.3 % (ref 11.5–15.5)
WBC: 8 10*3/uL (ref 4.0–10.5)

## 2015-12-29 LAB — BASIC METABOLIC PANEL
BUN: 16 mg/dL (ref 6–23)
CALCIUM: 8.7 mg/dL (ref 8.4–10.5)
CO2: 27 meq/L (ref 19–32)
Chloride: 103 mEq/L (ref 96–112)
Creatinine, Ser: 1.06 mg/dL (ref 0.40–1.50)
GFR: 76.25 mL/min (ref >60.00)
Glucose, Bld: 87 mg/dL (ref 70–99)
Potassium: 4 mEq/L (ref 3.5–5.1)
SODIUM: 136 meq/L (ref 135–145)

## 2015-12-29 LAB — TSH: TSH: 0.87 u[IU]/mL (ref 0.35–4.50)

## 2015-12-29 LAB — D-DIMER, QUANTITATIVE: D-Dimer, Quant: 0.43 mcg/mL FEU (ref <0.50)

## 2015-12-29 NOTE — Assessment & Plan Note (Signed)
Cardiol consult to consider heart cath is pending Chest pain start when he would walk into his [employer] - he would adapt in 2 hrs - ?occupational asthma Cont w/HFA Obtain labs, D-dimer F/u w/Dr Melvyn Novas

## 2015-12-29 NOTE — Progress Notes (Signed)
Subjective:  Patient ID: [name], male    DOB: 06-Mar-1956  Age: 60 y.o. MRN: [id number]  CC: No chief complaint on file.   HPI [name] presents for cough since Jan, weakness, sweats at times. On the 2nd of July had a drenching sweat at night x 1.He stopped Losartan - cough is not better. Fatigue started on Bystolic - taking 1/2 bid now. BP is good at home.   Chest pain start when he would walk into his [employer] - he would adapt in 2 hrs  Outpatient Prescriptions Prior to Visit  Medication Sig Dispense Refill  . aspirin EC 81 MG tablet Take 81 mg by mouth daily.    Marland Kitchen atorvastatin (LIPITOR) 10 MG tablet Take 1 tablet (10 mg total) by mouth daily. 30 tablet 11  . cholecalciferol (VITAMIN D) 1000 UNITS tablet Take 1 tablet (1,000 Units total) by mouth daily. 100 tablet 3  . doxazosin (CARDURA) 8 MG tablet Take 0.5 tablets (4 mg total) by mouth 2 (two) times daily. 90 tablet 1  . famotidine (PEPCID) 20 MG tablet One at bedtime 30 tablet 2  . guaiFENesin-dextromethorphan (ROBITUSSIN DM) 100-10 MG/5ML syrup Take 5 mLs by mouth every 4 (four) hours as needed for cough. 118 mL 0  . loratadine (CLARITIN) 10 MG tablet Take 1 tablet (10 mg total) by mouth daily. 30 tablet 11  . nebivolol (BYSTOLIC) 10 MG tablet Take 1 tablet (10 mg total) by mouth daily. (Patient taking differently: Take 5 mg by mouth daily. )    . nitroGLYCERIN (NITROSTAT) 0.4 MG SL tablet Place 1 tablet (0.4 mg total) under the tongue every 5 (five) minutes as needed for chest pain. 25 tablet 2  . omeprazole (PRILOSEC OTC) 20 MG tablet Take 2 tablets (40 mg total) by mouth daily. 60 tablet 5  . mometasone-formoterol (DULERA) 100-5 MCG/ACT AERO Take 2 puffs first thing in am and then another 2 puffs about 12 hours later. (Patient not taking: Reported on 12/29/2015) 1 Inhaler 11   No facility-administered medications prior to visit.    ROS Review of Systems  Constitutional: Positive for diaphoresis and  fatigue. Negative for appetite change and unexpected weight change.  HENT: Negative for congestion, nosebleeds, sneezing, sore throat and trouble swallowing.   Eyes: Negative for itching and visual disturbance.  Respiratory: Positive for cough.   Cardiovascular: Negative for chest pain, palpitations and leg swelling.  Gastrointestinal: Negative for nausea, diarrhea, blood in stool and abdominal distention.  Genitourinary: Negative for frequency and hematuria.  Musculoskeletal: Negative for back pain, joint swelling, gait problem and neck pain.  Skin: Negative for rash.  Neurological: Negative for dizziness, tremors, speech difficulty and weakness.  Psychiatric/Behavioral: Negative for suicidal ideas, sleep disturbance, dysphoric mood and agitation. The patient is not nervous/anxious.     Objective:  BP 90/60 mmHg  Pulse 82  Wt 200 lb (90.719 kg)  SpO2 95%  BP Readings from Last 3 Encounters:  12/29/15 90/60  12/16/15 106/80  12/08/15 94/62    Wt Readings from Last 3 Encounters:  12/29/15 200 lb (90.719 kg)  12/16/15 198 lb (89.812 kg)  12/08/15 201 lb 3.2 oz (91.264 kg)    Physical Exam  Constitutional: He is oriented to person, place, and time. He appears well-developed. No distress.  NAD  HENT:  Mouth/Throat: Oropharynx is clear and moist.  Eyes: Conjunctivae are normal. Pupils are equal, round, and reactive to light.  Neck: Normal range of motion. No JVD present. No thyromegaly  present.  Cardiovascular: Normal rate, regular rhythm, normal heart sounds and intact distal pulses.  Exam reveals no gallop and no friction rub.   No murmur heard. Pulmonary/Chest: Effort normal and breath sounds normal. No respiratory distress. He has no wheezes. He has no rales. He exhibits no tenderness.  Abdominal: Soft. Bowel sounds are normal. He exhibits no distension and no mass. There is no tenderness. There is no rebound and no guarding.  Musculoskeletal: Normal range of motion. He  exhibits no edema or tenderness.  Lymphadenopathy:    He has no cervical adenopathy.  Neurological: He is alert and oriented to person, place, and time. He has normal reflexes. No cranial nerve deficit. He exhibits normal muscle tone. He displays a negative Romberg sign. Coordination and gait normal.  Skin: Skin is warm and dry. No rash noted.  Psychiatric: He has a normal mood and affect. His behavior is normal. Judgment and thought content normal.    Lab Results  Component Value Date   WBC 6.9 07/30/2015   HGB 14.9 07/30/2015   HCT 43.5 07/30/2015   PLT 244.0 07/30/2015   GLUCOSE 88 07/30/2015   CHOL 176 07/30/2015   TRIG 238.0* 07/30/2015   HDL 41.70 07/30/2015   LDLDIRECT 90.0 07/30/2015   LDLCALC 87 12/30/2014   ALT 31 07/30/2015   AST 21 07/30/2015   NA 137 07/30/2015   K 4.2 07/30/2015   CL 105 07/30/2015   CREATININE 1.02 07/30/2015   BUN 16 07/30/2015   CO2 26 07/30/2015   TSH 1.64 07/30/2015   PSA 0.52 07/30/2015   HGBA1C 5.7 07/30/2015    Dg Chest 2 View  11/18/2015  CLINICAL DATA:  Cough for 3 weeks. EXAM: CHEST  2 VIEW COMPARISON:  Chest x-rays dated 07/09/2015 and 12/30/2014. FINDINGS: Heart size is normal. Overall cardiomediastinal silhouette is stable in size and configuration. Lungs are clear. Lung volumes are normal. No pleural effusion or pneumothorax seen. Mild degenerative spurring again noted at the thoracolumbar junction. No acute or suspicious osseous abnormality. IMPRESSION: No evidence of acute cardiopulmonary abnormality. No evidence of pneumonia. Electronically Signed   By: Franki Cabot M.D.   On: 11/18/2015 16:19    Assessment & Plan:   There are no diagnoses linked to this encounter. I am having Mr. Moravek maintain his aspirin EC, atorvastatin, cholecalciferol, nitroGLYCERIN, doxazosin, omeprazole, loratadine, guaiFENesin-dextromethorphan, nebivolol, mometasone-formoterol, and famotidine.  No orders of the defined types were placed in this  encounter.     Follow-up: No Follow-up on file.  Walker Kehr, MD

## 2015-12-29 NOTE — Assessment & Plan Note (Signed)
Chest pain start when he would walk into his [employer] - he would adapt in 2 hrs - ?occupational asthma Cont w/HFA Obtain labs, D-dimer F/u w/Dr Melvyn Novas

## 2015-12-29 NOTE — Assessment & Plan Note (Signed)
Post-URI in Jan 2017 Chest pain start when he would walk into his [employer] - he would adapt in 2 hrs - ?occupational asthma Cont w/HFA Obtain labs, D-dimer F/u w/Dr Corrie Dandy

## 2015-12-29 NOTE — Assessment & Plan Note (Signed)
?  etiology  Due to Bystolic?

## 2015-12-29 NOTE — Progress Notes (Signed)
Pre visit review using our clinic review tool, if applicable. No additional management support is needed unless otherwise documented below in the visit note. 

## 2015-12-31 ENCOUNTER — Ambulatory Visit (INDEPENDENT_AMBULATORY_CARE_PROVIDER_SITE_OTHER): Payer: BLUE CROSS/BLUE SHIELD | Admitting: Pulmonary Disease

## 2015-12-31 ENCOUNTER — Encounter: Payer: Self-pay | Admitting: Pulmonary Disease

## 2015-12-31 VITALS — BP 112/68 | HR 75 | Ht 69.0 in | Wt 201.0 lb

## 2015-12-31 DIAGNOSIS — R05 Cough: Secondary | ICD-10-CM

## 2015-12-31 DIAGNOSIS — R0609 Other forms of dyspnea: Secondary | ICD-10-CM | POA: Diagnosis not present

## 2015-12-31 DIAGNOSIS — J4520 Mild intermittent asthma, uncomplicated: Secondary | ICD-10-CM

## 2015-12-31 DIAGNOSIS — K219 Gastro-esophageal reflux disease without esophagitis: Secondary | ICD-10-CM

## 2015-12-31 DIAGNOSIS — R0600 Dyspnea, unspecified: Secondary | ICD-10-CM | POA: Diagnosis not present

## 2015-12-31 DIAGNOSIS — R059 Cough, unspecified: Secondary | ICD-10-CM

## 2015-12-31 NOTE — Patient Instructions (Signed)
It was a pleasure taking care of you today!  Your cough is most likely multifactorial. Likely related to the following: A. Upper Airway Cough Syndrome  Start Flonase 2 squirts per nostril at HS  Start Singulair 10 mg/tab, 1 tab at HS  Cont  Claritin 10 mg/tab, 1 tab in AM  Start Delsym 10 mls or 2 teaspoon 2x/day.  B. GERD  Continue Prilosec 20 mg/tab 1 tab 2x/day  Advised on dietary changes. Avoid food that will make your reflux worse.   Try to keep head of bed 30 degrees elevated.  Wait 2 hours at least after your last meal before you lie down  We will get a chest ct scan.  Return to clinic in in 4-6 weeks.

## 2015-12-31 NOTE — Progress Notes (Signed)
Subjective:    Patient ID: [name], male    DOB: 09/25/1955, 60 y.o.   MRN: [id number]  HPI   This is the case of [name], 60 y.o. Male, who was referred by Dr. Walker Kehr in consultation regarding cough.   As you very well know, patient is a non smoker. Not been diagnosed with asthma or copd.  Pt is originally from [place], has been in [place] almost 18 yrs.  No h/o allergies or sinus issues or chronic cough until start of the year.   Pt has been coughing since 06/2015 -- not sure what triggers are. He coughs on and off, work and at home. Not necessarily worse at sleep. Dry cough, some mucus sometimes. Cough better usually by itself.  Same cough severity and frequency x 6 mos.    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 He works 2-3 x/week. Does night shift from 11pm-8 am. His cough is mildly worse at work,   Cough did NOT start of after an infection. Was Rx abx but he did NOT take it. Took pred for a week and was not better.   Pt with exertional dyspnea at work x 6-7 mos.   Stess test in 06/2015 was N. Pt does not want LHC despite being advised this.   Has GERD. Not on meds.    Has lived at current house x 5yrs. (-) pets. (-) carpets, molds, stagnant water. (-) hot tub use.  (-) Recent ravel. No new meds.   ROV (12/31/15) Pt is here for f/u on cough. During this encounter, there was an interpreter who spoke to the pt in [place] to facilitate better communication. Has seen Noe Gens on 12/08/15 and Dr. Melvyn Novas 12/16/15 since last seen by me.  Meds were changed. Symbicort to [facility] and Dr. Melvyn Novas stopped ARB and gave him bystolic. Since last seen, he states his cough is worse, despite med changes.  He took his meds as prescribed. Coughs all day and night. He has been at [facility] [facility] since 08/2013 and he has been buffing the floors since that time. Cough started in 06/2015. He does shift work from 11pm-8am,  He  has 4 nights on and 3 nights off. He bellieves his cough is related to exposure to propane. He states on Jan 15 or 16th 2017, he started vomiting at work and he started coughing bad and got "really very sick". Since that time, he has been coughing daily.  Prior to that, he was just coughing during work, and gets better during days off.  He felt sick that night -- throwing up, fevers, chills, SOB BUT he did not go to ER.  Saw PCP 2-3 days after the incident > work up was done. Sx got better except for cough.   Dr. Melvyn Novas gave him dulera > no effect. Symbicort I gave did not help.  Takes prilosec in am and zantac at night > no effect.  Cozaar was dc'd by Dr. Melvyn Novas and switched to bystolic.  Bystolic made him fatigued and weak. He stopped bystolic x 2 days.  PFT could not be done 2/2 intractable coughing.  Has not tried flonase. Takes Claritin daily.  Tried Prednisone before > not sure if it helped him.  He has not been admitted nor has been on abx since last seen by me.  Currently working still at [employer] but has not been buffing floors since June. Cough is not better.  Review of Systems  Constitutional: Negative.  Negative for fever and unexpected weight change.  HENT: Negative for congestion, dental problem, ear pain, nosebleeds, postnasal drip, rhinorrhea, sinus pressure, sneezing, sore throat and trouble swallowing.   Eyes: Negative.  Negative for redness and itching.  Respiratory: Positive for cough, chest tightness and shortness of breath. Negative for wheezing.   Cardiovascular: Negative.  Negative for palpitations and leg swelling.  Gastrointestinal: Negative.  Negative for nausea and vomiting.  Endocrine: Negative.   Genitourinary: Negative.  Negative for dysuria.  Musculoskeletal: Positive for arthralgias. Negative for joint swelling.  Skin: Negative.  Negative for rash.  Allergic/Immunologic: Negative.   Neurological: Positive for headaches.  Hematological: Negative.  Does not bruise/bleed easily.  Psychiatric/Behavioral: Negative.  Negative for dysphoric mood. The patient is not nervous/anxious.       Objective:   Physical Exam   Vitals:  Filed Vitals:   12/31/15 1604  BP: 112/68  Pulse: 75  Height: 5\' 9"  (1.753 m)  Weight: 201 lb (91.173 kg)  SpO2: 95%    Constitutional/General:  Pleasant, well-nourished, well-developed, not in any distress,  Comfortably seating.  Well kempt. Coughs every now and then.   Body mass index is 29.67 kg/(m^2). Wt Readings from Last 3 Encounters:  12/31/15 201 lb (91.173 kg)  12/29/15 200 lb (90.719 kg)  12/16/15 198 lb (89.812 kg)    HEENT: Pupils equal and reactive to light and accommodation. Anicteric sclerae. Normal nasal mucosa.     No oral  lesions,  mouth clear,  oropharynx clear, no postnasal drip. (-) Oral thrush. No dental caries.  Airway - Mallampati class III  Neck: No masses. Midline trachea. No JVD, (-) LAD. (-) bruits appreciated.  Respiratory/Chest: Grossly normal chest. (-) deformity. (-) Accessory muscle use.  Symmetric expansion. (-) Tenderness on palpation.  Resonant on percussion.  Diminished BS on both lower lung zones. (-) crackles, rhonchi. Occasional wheezing.  (-) egophony  Cardiovascular: Regular rate and  rhythm, heart sounds normal, no murmur or gallops, no peripheral edema  Gastrointestinal:  Normal bowel sounds. Soft, non-tender. No hepatosplenomegaly.  (-) masses.   Musculoskeletal:  Normal muscle tone. Normal gait.   Extremities: Grossly normal. (-) clubbing, cyanosis.  (-) edema  Skin: (-) rash,lesions seen.   Neurological/Psychiatric : alert, oriented to time, place, person. Normal mood and affect           Assessment & Plan:  Cough Cough since 06/2015. Complicated history.  See history.  Bottom line, pt is implicating exposure to propane at work as the cause of his cough. 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  Differentials: 1. Pt implicates exposure to propane while at work as cause of his cough.  He has had chronic cough during work, better with days off. On jan 15th or 16th of 2017, he felt "really very sick" at work with fevers, chills, nausea, vomiting, severe cough.  Has been coughing daily since that time.  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 I am not certain if patient has work related asthma.  2. UACS 3. GERD. 4. Hypersensitivity reaction/asthma type reaction  Plan : 1. He is not better with symbicort or dulera. Will try neb meds with pulmicort and duoneb. 2. Rx for UACS. Start flonase, singulair. Cont claritin 3. Increase dose of PPI to BID. 4. Will need PFT once cough is better. He cant do it 2/2 cough. 5. Needs IgE and  allergy test 6. Holding off on prednisone for now  Asthma Not sure  if pt with asthma flare up or Work related asthma. Need to ask re: asthma history again on f/u. Needs PFT Try neb meds with pulmicort and duoneb.   Exertional dyspnea Has some SOB related to cough, wheeze. Stress test in 06/2015 was N.  Not sure if pt needs LHC per chart review.  CXR (10/2015) possible inc markings at the bases Needs chest ct scan.   GERD Inc PPI to BID   Return to clinic in 4-6 weeks.       Monica Becton, MD 01/01/2016   7:19 AM Pulmonary and [place] Pager: [phone] Office: [phone], Fax: [phone]

## 2016-01-01 ENCOUNTER — Telehealth: Payer: Self-pay | Admitting: Pulmonary Disease

## 2016-01-01 DIAGNOSIS — J4520 Mild intermittent asthma, uncomplicated: Secondary | ICD-10-CM

## 2016-01-01 LAB — PULMONARY FUNCTION TEST
FEF 25-75 Pre: 3.22 L/sec
FEF2575-%Pred-Pre: 107 %
FEV1-%Pred-Pre: 97 %
FEV1-Pre: 3.45 L
FEV1FVC-%PRED-PRE: 88 %
FEV6-%PRED-PRE: 115 %
FEV6-Pre: 5.14 L
FEV6FVC-%PRED-PRE: 104 %
FVC-%Pred-Pre: 110 %
FVC-PRE: 5.14 L
PRE FEV1/FVC RATIO: 67 %
PRE FEV6/FVC RATIO: 100 %

## 2016-01-01 MED ORDER — BUDESONIDE 0.5 MG/2ML IN SUSP: 0.5000 mg | Freq: Two times a day (BID) | RESPIRATORY_TRACT | Status: DC

## 2016-01-01 MED ORDER — IPRATROPIUM-ALBUTEROL 0.5-2.5 (3) MG/3ML IN SOLN: 3.0000 mL | Freq: Four times a day (QID) | RESPIRATORY_TRACT | Status: DC

## 2016-01-01 NOTE — Telephone Encounter (Signed)
   I forgot to order the following yesterday:  1. Pls order IgE and allergy test (blood).  2. Pls order nebulizer and pulmicort neb 0.5 mg BID and duoneb QID.  Pls call pt and tell him I forgot to order these meds and labs.  He should do these and we need to observe if he gets better with meds.   Thanks.  AD

## 2016-01-01 NOTE — Telephone Encounter (Signed)
Spoke with pt and advised of AD's request for labs and to start neb machine/meds. Pt agrees. Orders sent. Advised we will contact him with lab results and that DME will contact him for neb set up. Nothing further needed.

## 2016-01-01 NOTE — Assessment & Plan Note (Signed)
Has some SOB related to cough, wheeze. Stress test in 06/2015 was N.  Not sure if pt needs LHC per chart review.  CXR (10/2015) possible inc markings at the bases Needs chest ct scan.

## 2016-01-01 NOTE — Assessment & Plan Note (Addendum)
Cough since 06/2015. Complicated history.  See history.  Bottom line, pt is implicating exposure to propane at work as the cause of his cough. 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  Differentials: 1. Pt implicates exposure to propane while at work as cause of his cough.  He has had chronic cough during work, better with days off. On jan 15th or 16th of 2017, he felt "really very sick" at work with fevers, chills, nausea, vomiting, severe cough.  Has been coughing daily since that time.  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 I am not certain if patient has work related asthma.  2. UACS 3. GERD. 4. Hypersensitivity reaction/asthma type reaction  Plan : 1. He is not better with symbicort or dulera. Will try neb meds with pulmicort and duoneb. 2. Rx for UACS. Start flonase, singulair. Cont claritin 3. Increase dose of PPI to BID. 4. Will need PFT once cough is better. He cant do it 2/2 cough. 5. Needs IgE and allergy test 6. Holding off on prednisone for now.

## 2016-01-01 NOTE — Assessment & Plan Note (Signed)
Not sure if pt with asthma flare up or Work related asthma. Need to ask re: asthma history again on f/u. Needs PFT Try neb meds with pulmicort and duoneb.

## 2016-01-01 NOTE — Assessment & Plan Note (Signed)
Inc PPI to BID

## 2016-01-02 ENCOUNTER — Telehealth: Payer: Self-pay | Admitting: Pulmonary Disease

## 2016-01-02 ENCOUNTER — Other Ambulatory Visit: Payer: BLUE CROSS/BLUE SHIELD

## 2016-01-02 DIAGNOSIS — J4520 Mild intermittent asthma, uncomplicated: Secondary | ICD-10-CM

## 2016-01-02 MED ORDER — MONTELUKAST SODIUM 10 MG PO TABS: 10.0000 mg | ORAL_TABLET | Freq: Every day | ORAL | Status: DC

## 2016-01-02 MED ORDER — FLUTICASONE PROPIONATE 50 MCG/ACT NA SUSP: 2.0000 | Freq: Every day | NASAL | Status: DC

## 2016-01-02 NOTE — Telephone Encounter (Signed)
Went and spoke with pt and he is aware of the flonase and singulair that has been sent to his pharmacy.  Nothing further is needed.

## 2016-01-05 ENCOUNTER — Telehealth: Payer: Self-pay | Admitting: Pulmonary Disease

## 2016-01-05 LAB — RESPIRATORY ALLERGY PROFILE REGION II ~~LOC~~
Allergen, Cedar tree, t12: <0.1 kU/L
Allergen, D pternoyssinus,d7: <0.1 kU/L
Allergen, Mouse Urine Protein, e78: <0.1 kU/L
Allergen, Oak,t7: <0.1 kU/L
Box Elder IgE: <0.1 kU/L
Cat Dander: <0.1 kU/L
D. farinae: <0.1 kU/L
Dog Dander: <0.1 kU/L
Elm IgE: <0.1 kU/L
IGE (IMMUNOGLOBULIN E), SERUM: 49 kU/L (ref <115)
Johnson Grass: <0.1 kU/L
Rough Pigweed  IgE: <0.1 kU/L
Sheep Sorrel IgE: <0.1 kU/L

## 2016-01-05 MED ORDER — BENZONATATE 200 MG PO CAPS: 200.0000 mg | ORAL_CAPSULE | Freq: Three times a day (TID) | ORAL | Status: DC | PRN

## 2016-01-05 NOTE — Telephone Encounter (Signed)
Can send script for tessalon perles 200 mg tid prn, #30 with 1 refill.

## 2016-01-05 NOTE — Telephone Encounter (Signed)
Called spoke with pt. Aware of recs below. Rx sent in. Nothing further needed

## 2016-01-05 NOTE — Telephone Encounter (Signed)
Spoke with pt.  He c/o worsening cough mostly dry.  Denies sob, wheezing.  Pt states that has is not using nebulizer because it made cough worse.  He is following all instructions given by Dr Corrie Dandy on 12/31/15 (see below).  Please advise  Your cough is most likely multifactorial. Likely related to the following: A. Upper Airway Cough Syndrome             Start Flonase 2 squirts per nostril at HS             Start Singulair 10 mg/tab, 1 tab at HS             Cont  Claritin 10 mg/tab, 1 tab in AM             Start Delsym 10 mls or 2 teaspoon 2x/day.   B. GERD             Continue Prilosec 20 mg/tab 1 tab 2x/day             Advised on dietary changes. Avoid food that will make your reflux worse.               Try to keep head of bed 30 degrees elevated.             Wait 2 hours at least after your last meal before you lie down  We will get a chest ct scan.  Return to clinic in in 4-6 weeks.

## 2016-01-08 ENCOUNTER — Ambulatory Visit (INDEPENDENT_AMBULATORY_CARE_PROVIDER_SITE_OTHER)
Admission: RE | Admit: 2016-01-08 | Discharge: 2016-01-08 | Disposition: A | Discharge status: Home / Self Care | Payer: BLUE CROSS/BLUE SHIELD | Source: Ambulatory Visit | Attending: Pulmonary Disease | Admitting: Pulmonary Disease

## 2016-01-08 DIAGNOSIS — R0600 Dyspnea, unspecified: Secondary | ICD-10-CM

## 2016-01-09 ENCOUNTER — Telehealth: Payer: Self-pay | Admitting: Pulmonary Disease

## 2016-01-09 NOTE — Telephone Encounter (Signed)
Called spoke with pt. He is aware Dr. Corrie Dandy is not in the office this week. 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 Please advise thanks

## 2016-01-11 NOTE — Telephone Encounter (Signed)
   pls tell pt the chest ct scan showed very mild scarring in RML and Lingula.  This will not cause the cough that he has.  Also, the alleryg test was OK except for his IgE being elevated.  We can discuss further when he follows up.  Thanks.  AD

## 2016-01-12 NOTE — Telephone Encounter (Signed)
Called spoke with pt. I explained the results and recs. I verified his appointment on 02/02/16. He voiced understanding and had no further questions. Nothing further needed at this time.

## 2016-01-13 ENCOUNTER — Telehealth: Payer: Self-pay | Admitting: Pulmonary Disease

## 2016-01-13 NOTE — Telephone Encounter (Signed)
Spoke with pt, requesting results of his labwork, PFT, and CT chest.  Pt also wants to know if he can discontinue any of his pulmonary medications at this time, or if his medication recommendations should remain the same.  Pt's medication list in chart is current.  AD please advise on recs.  Thanks!

## 2016-01-13 NOTE — Telephone Encounter (Signed)
lmtcb x1 

## 2016-01-13 NOTE — Telephone Encounter (Signed)
Called spoke with pt and his son. Reviewed results and recs. Pt voiced understanding and had no further questions. Scheduled ov for 01/23/16. Nothing further needed.

## 2016-01-13 NOTE — Telephone Encounter (Signed)
   Unfortunately, its hard to manage this pt via epic and phone calls. He is looking into getting workmans compensation/disability so we have to be careful with what we document and advise.  I already mentioned results of his chest ct scan in a different message -- mild scarring in RML and lingula and calcification in the coronaries and small pericardial effusion. I dont think this will cause the cough. He needs to f/u with cards.  PFT shows mild COPD changes but it was a poor test as he was coughing.   My recommendation : Continue with current meds until seen in the office.  I can see him on aug 3rd am or pm or aug 4th am.  If those dates dont work out (or he cant wait that long), he can see any of the providers at the office this week or next week.   Thank you.  Monica Becton, MD 01/13/2016, 2:45 PM [place] [facility] Pager [phone] After 3 pm or if no answer, call [phone]

## 2016-01-15 ENCOUNTER — Telehealth: Payer: Self-pay | Admitting: Pulmonary Disease

## 2016-01-15 NOTE — Telephone Encounter (Signed)
LMTCB

## 2016-01-15 NOTE — Telephone Encounter (Signed)
Attempted to contact pt. No answer, no option to leave a message. Will try back.  

## 2016-01-15 NOTE — Telephone Encounter (Signed)
[phone], pt mother cb

## 2016-01-16 NOTE — Telephone Encounter (Signed)
Pt calling to say that the medication he has is not working. Doesn't know whether to continue or stop. pls call pt at [phone]

## 2016-01-16 NOTE — Telephone Encounter (Signed)
atc pt X2, line rang to fast busy signal.  Wcb.  

## 2016-01-16 NOTE — Telephone Encounter (Signed)
Attempted to contact pt. Line rang busy x2. Will try back. 

## 2016-01-19 NOTE — Telephone Encounter (Signed)
Can I see this pt on Thursday or Friday? Needs [place] interpreter  Thanks!  AD

## 2016-01-19 NOTE — Telephone Encounter (Signed)
Spoke with pt. He is wanting to know what medications he needs to continue. States that the current inhaler that he is on is not working.  AD - please advise. Thanks.

## 2016-01-20 ENCOUNTER — Ambulatory Visit: Payer: BLUE CROSS/BLUE SHIELD | Admitting: Cardiovascular Disease

## 2016-01-20 NOTE — Telephone Encounter (Signed)
Pt has an appt with AD On 8/4 to discuss his medications.

## 2016-01-21 ENCOUNTER — Encounter: Payer: Self-pay | Admitting: Cardiovascular Disease

## 2016-01-21 ENCOUNTER — Ambulatory Visit (INDEPENDENT_AMBULATORY_CARE_PROVIDER_SITE_OTHER): Payer: BLUE CROSS/BLUE SHIELD | Admitting: Cardiovascular Disease

## 2016-01-21 ENCOUNTER — Other Ambulatory Visit: Payer: Self-pay | Admitting: *Deleted

## 2016-01-21 VITALS — BP 108/72 | HR 93 | Ht 69.0 in | Wt 203.0 lb

## 2016-01-21 DIAGNOSIS — Z01810 Encounter for preprocedural cardiovascular examination: Secondary | ICD-10-CM | POA: Diagnosis not present

## 2016-01-21 DIAGNOSIS — Z79899 Other long term (current) drug therapy: Secondary | ICD-10-CM

## 2016-01-21 DIAGNOSIS — Z01818 Encounter for other preprocedural examination: Secondary | ICD-10-CM

## 2016-01-21 DIAGNOSIS — R079 Chest pain, unspecified: Secondary | ICD-10-CM

## 2016-01-21 NOTE — Assessment & Plan Note (Signed)
History of ongoing chest pain was somewhat exertional. He did have a negative Myoview stress test performed 07/15/15. 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 Because of his risk factors including hypertension, hyperlipidemia and family history I'm going to proceed with outpatient diagnostic coronary arteriography via the right radial approach.  I have reviewed the risks, indications, and alternatives to cardiac catheterization, possible angioplasty, and stenting with the patient. Risks include but are not limited to bleeding, infection, vascular injury, stroke, myocardial infection, arrhythmia, kidney injury, radiation-related injury in the case of prolonged fluoroscopy use, emergency cardiac surgery, and death. The patient understands the risks of serious complication is 1-2 in 123XX123 with diagnostic cardiac cath and 1-2% or less with angioplasty/stenting.

## 2016-01-21 NOTE — Assessment & Plan Note (Signed)
History of hypertension blood pressure measured 108/72. He is on Cardura. Continue current meds at current dosing

## 2016-01-21 NOTE — Progress Notes (Signed)
01/21/2016 [name]   1955-11-09  [id number]  Primary Physician Walker Kehr, MD Primary Cardiologist: Lorretta Harp MD Renae Gloss  HPI:  [name] is a 60 year old single [place] male whose mother is a patient of mine. He has a history of treated hypertension, hyperlipidemia and family history for heart disease. He is complained of chest pain off and on for several years. Somewhat exertional. He did have a stress test done in January that was low risk. He's had a persistent cough over the last 6 months. Recent chest CT performed 01/08/16 showed no significant pulmonary abnormalities but there was a small pericardial effusion and calcium in the LAD territory.   Current Outpatient Prescriptions  Medication Sig Dispense Refill  . aspirin EC 81 MG tablet Take 81 mg by mouth daily.    Marland Kitchen atorvastatin (LIPITOR) 10 MG tablet Take 1 tablet (10 mg total) by mouth daily. 30 tablet 11  . budesonide (PULMICORT) 0.5 MG/2ML nebulizer solution Take 2 mLs (0.5 mg total) by nebulization 2 (two) times daily. 120 mL 5  . cholecalciferol (VITAMIN D) 1000 UNITS tablet Take 1 tablet (1,000 Units total) by mouth daily. 100 tablet 3  . doxazosin (CARDURA) 8 MG tablet Take 0.5 tablets (4 mg total) by mouth 2 (two) times daily. 90 tablet 1  . fluticasone (FLONASE) 50 MCG/ACT nasal spray Place 2 sprays into both nostrils at bedtime. 16 g 5  . loratadine (CLARITIN) 10 MG tablet Take 1 tablet (10 mg total) by mouth daily. 30 tablet 11  . mometasone-formoterol (DULERA) 100-5 MCG/ACT AERO Take 2 puffs first thing in am and then another 2 puffs about 12 hours later. 1 Inhaler 11  . montelukast (SINGULAIR) 10 MG tablet Take 1 tablet (10 mg total) by mouth at bedtime. 30 tablet 5  . omeprazole (PRILOSEC OTC) 20 MG tablet Take 2 tablets (40 mg total) by mouth daily. 60 tablet 5   No current facility-administered medications for this visit.     Allergies  Allergen Reactions  . Atorvastatin      REACTION: leg cramps per patient with a large dose  . Flomax [Tamsulosin Hcl]     Hard time breathing    Social History   Social History  . Marital status: Divorced    Spouse name: N/A  . Number of children: 1  . Years of education: N/A   Occupational History  . Animal nutritionist    Social History Main Topics  . Smoking status: Never Smoker  . Smokeless tobacco: Never Used  . Alcohol use No     Comment: rare  . Drug use: No  . Sexual activity: No   Other Topics Concern  . Not on file   Social History Narrative   Coffee daily      Review of Systems: General: negative for chills, fever, night sweats or weight changes.  Cardiovascular: negative for chest pain, dyspnea on exertion, edema, orthopnea, palpitations, paroxysmal nocturnal dyspnea or shortness of breath Dermatological: negative for rash Respiratory: negative for cough or wheezing Urologic: negative for hematuria Abdominal: negative for nausea, vomiting, diarrhea, bright red blood per rectum, melena, or hematemesis Neurologic: negative for visual changes, syncope, or dizziness All other systems reviewed and are otherwise negative except as noted above.    Blood pressure 108/72, pulse 93, height 5\' 9"  (1.753 m), weight 203 lb (92.1 kg).  General appearance: alert and no distress Neck: no adenopathy, no carotid bruit, no JVD, supple, symmetrical, trachea midline and  thyroid not enlarged, symmetric, no tenderness/mass/nodules Lungs: clear to auscultation bilaterally Heart: regular rate and rhythm, S1, S2 normal, no murmur, click, rub or gallop Extremities: extremities normal, atraumatic, no cyanosis or edema  EKG normal sinus rhythm at 93 with borderline evidence of left ventricular hypertrophy. Percentage of this EKG  ASSESSMENT AND PLAN:   Dyslipidemia History of hyperlipidemia on Lipitor with recent lipid profile performed 07/30/15 revealed a total cholesterol 176, triglyceride level of 238 and HDL of 41.  His LDLs in the past have been in the 80-90 range.  Essential hypertension History of hypertension blood pressure measured 108/72. He is on Cardura. Continue current meds at current dosing  Chest pain with moderate risk of acute coronary syndrome History of ongoing chest pain was somewhat exertional. He did have a negative Myoview stress test performed 07/15/15. 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 Because of his risk factors including hypertension, hyperlipidemia and family history I'm going to proceed with outpatient diagnostic coronary arteriography via the right radial approach.  I have reviewed the risks, indications, and alternatives to cardiac catheterization, possible angioplasty, and stenting with the patient. Risks include but are not limited to bleeding, infection, vascular injury, stroke, myocardial infection, arrhythmia, kidney injury, radiation-related injury in the case of prolonged fluoroscopy use, emergency cardiac surgery, and death. The patient understands the risks of serious complication is 1-2 in 123XX123 with diagnostic cardiac cath and 1-2% or less with angioplasty/stenting.       Lorretta Harp MD FACP,FACC,FAHA, [facility] 01/21/2016 10:15 AM

## 2016-01-21 NOTE — Assessment & Plan Note (Signed)
History of hyperlipidemia on Lipitor with recent lipid profile performed 07/30/15 revealed a total cholesterol 176, triglyceride level of 238 and HDL of 41. His LDLs in the past have been in the 80-90 range.

## 2016-01-21 NOTE — Patient Instructions (Addendum)
Medication Instructions:  Your physician recommends that you continue on your current medications as directed. Please refer to the Current Medication list given to you today.   Testing/Procedures: Your physician has requested that you have an echocardiogram. Echocardiography is a painless test that uses sound waves to create images of your heart. It provides your doctor with information about the size and shape of your heart and how well your heart's chambers and valves are working. This procedure takes approximately one hour. There are no restrictions for this procedure.  IN THE NEXT WEEK/BEFORE HEART CATHETERIZATION.   Your physician has requested that you have a cardiac catheterization. Cardiac catheterization is used to diagnose and/or treat various heart conditions. Doctors may recommend this procedure for a number of different reasons. The most common reason is to evaluate chest pain. Chest pain can be a symptom of coronary artery disease (CAD), and cardiac catheterization can show whether plaque is narrowing or blocking your heart's arteries. This procedure is also used to evaluate the valves, as well as measure the blood flow and oxygen levels in different parts of your heart. For further information please visit [email or web address].  SCHEDULE FOR AUGUST 17TH.  Following your catheterization, you will not be allowed to drive for 3 days.  No lifting, pushing, or pulling greater that 10 pounds is allowed for 1 week.  You will be required to have the following tests prior to the procedure:  1. Blood work-the blood work can be done no more than 14 days prior to the procedure.  It can be done at any [facility] lab.  There is one downstairs on the first floor of this building and one in the [facility] building [phone]).  2. Chest Xray-the chest xray order has already been placed at the [facility].      Puncture site RIGHT RADIAL  If you  need a refill on your cardiac medications before your next appointment, please call your pharmacy.

## 2016-01-23 ENCOUNTER — Encounter: Payer: Self-pay | Admitting: Pulmonary Disease

## 2016-01-23 ENCOUNTER — Ambulatory Visit (INDEPENDENT_AMBULATORY_CARE_PROVIDER_SITE_OTHER): Payer: BLUE CROSS/BLUE SHIELD | Admitting: Pulmonary Disease

## 2016-01-23 VITALS — BP 108/62 | HR 98 | Ht 69.0 in | Wt 197.0 lb

## 2016-01-23 DIAGNOSIS — K219 Gastro-esophageal reflux disease without esophagitis: Secondary | ICD-10-CM | POA: Diagnosis not present

## 2016-01-23 DIAGNOSIS — R059 Cough, unspecified: Secondary | ICD-10-CM

## 2016-01-23 DIAGNOSIS — R05 Cough: Secondary | ICD-10-CM

## 2016-01-23 MED ORDER — SUCRALFATE 1 G PO TABS: 1.0000 g | ORAL_TABLET | Freq: Three times a day (TID) | ORAL | 5 refills | Status: DC

## 2016-01-23 NOTE — Progress Notes (Signed)
Subjective:    Patient ID: [name], male    DOB: 10/07/1955, 60 y.o.   MRN: [id number]  HPI   This is the case of [name], 60 y.o. Male, who was referred by Dr. Walker Kehr in consultation regarding cough.   As you very well know, patient is a non smoker. Not been diagnosed with asthma or copd.  Pt is originally from [place], has been in [place] almost 18 yrs.  No h/o allergies or sinus issues or chronic cough until start of the year.   Pt has been coughing since 06/2015 -- not sure what triggers are. He coughs on and off, work and at home. Not necessarily worse at sleep. Dry cough, some mucus sometimes. Cough better usually by itself.  Same cough severity and frequency x 6 mos.    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 He works 2-3 x/week. Does night shift from 11pm-8 am. His cough is mildly worse at work,   Cough did NOT start of after an infection. Was Rx abx but he did NOT take it. Took pred for a week and was not better.   Pt with exertional dyspnea at work x 6-7 mos.   Stess test in 06/2015 was N. Pt does not want LHC despite being advised this.   Has GERD. Not on meds.    Has lived at current house x 40yrs. (-) pets. (-) carpets, molds, stagnant water. (-) hot tub use.  (-) Recent ravel. No new meds.   ROV (12/31/15) Pt is here for f/u on cough. During this encounter, there was an interpreter who spoke to the pt in [place] to facilitate better communication. Has seen Noe Gens on 12/08/15 and Dr. Melvyn Novas 12/16/15 since last seen by me.  Meds were changed. Symbicort to [facility] and Dr. Melvyn Novas stopped ARB and gave him bystolic. Since last seen, he states his cough is worse, despite med changes.  He took his meds as prescribed. Coughs all day and night. He has been at [facility] [employer] since 08/2013 and he has been buffing the floors since that time. Cough started in 06/2015. He does shift work from 11pm-8am,  He  has 4 nights on and 3 nights off. He bellieves his cough is related to exposure to propane. He states on Jan 15 or 16th 2017, he started vomiting at work and he started coughing bad and got "really very sick". Since that time, he has been coughing daily.  Prior to that, he was just coughing during work, and gets better during days off.  He felt sick that night -- throwing up, fevers, chills, SOB BUT he did not go to ER.  Saw PCP 2-3 days after the incident > work up was done. Sx got better except for cough.   Dr. Melvyn Novas gave him dulera > no effect. Symbicort I gave did not help.  Takes prilosec in am and zantac at night > no effect.  Cozaar was dc'd by Dr. Melvyn Novas and switched to bystolic.  Bystolic made him fatigued and weak. He stopped bystolic x 2 days.  PFT could not be done 2/2 intractable coughing.  Has not tried flonase. Takes Claritin daily.  Tried Prednisone before > not sure if it helped him.  He has not been admitted nor has been on abx since last seen by me.  Currently working still at [employer] but has not been buffing floors since June. Cough is not better.                                                                                                                                                                                                                                                                              ROV 01/23/16   Pt is in the office as f/u on his cough. The interview was with a [place] interpreter who also was with [place] last time.  Has never been dxed with asthma or copd or any lung problems.    He states he is the same as last month. He still coughs everyday. He coughs in his sleep. (-) fevers, chills.On and off cp x 8 months.    CP is exertional, more  Frequent, happens 2-3 times/day but not daily, lasts for 2-3 minutes, usually followed by cough.   There have been times wherein he just had cp with no cough or assoc sx. Had PFT last month which showed mild COPD BUT PFT tecqnique was not good as he was coughing. Nebs made him cough more. Restarted dulera > not making a difference. Still takes prilosec BID.     Takes flonase and singulair.   Not on pred. Has not taken time off.   Pt is scheduled to have [place] on 8/17. He has not been admitted nor has been on abx since last seen.  Review of Systems  Constitutional: Negative.  Negative for fever and unexpected weight change.  HENT: Negative for congestion, dental problem, ear pain, nosebleeds, postnasal drip, rhinorrhea, sinus pressure, sneezing, sore throat and trouble swallowing.   Eyes: Negative.  Negative for redness and itching.  Respiratory: Positive for cough, chest tightness and shortness of breath. Negative for wheezing.   Cardiovascular: Negative.  Negative for palpitations and leg swelling.  Gastrointestinal: Negative.  Negative for nausea and vomiting.  Endocrine: Negative.   Genitourinary: Negative.  Negative for dysuria.  Musculoskeletal: Positive for arthralgias. Negative for joint swelling.  Skin: Negative.  Negative for rash.    Allergic/Immunologic: Negative.   Neurological: Positive for headaches.  Hematological: Negative.  Does not bruise/bleed easily.  Psychiatric/Behavioral: Negative.  Negative for dysphoric mood. The patient is not nervous/anxious.       Objective:   Physical Exam   Vitals:  Vitals:   01/23/16 1119  BP: 108/62  Pulse: 98  SpO2: 93%  Weight: 89.4 kg (197 lb)  Height: 5\' 9"  (1.753 m)    Constitutional/General:  Pleasant, well-nourished, well-developed, not in any distress,  Comfortably seating.  Well kempt. Coughs every now and then.   Body mass index is 29.09 kg/m. Wt Readings from Last 3 Encounters:  01/23/16 89.4 kg (197 lb)  01/21/16 92.1 kg (203 lb)  12/31/15 91.2 kg (201 lb)    HEENT: Pupils equal and reactive to light and accommodation. Anicteric sclerae. Normal nasal mucosa.   No oral  lesions,  mouth clear,  oropharynx clear, no postnasal drip. (-) Oral thrush. No dental caries.  Airway - Mallampati class III  Neck: No masses. Midline trachea. No JVD, (-) LAD. (-) bruits appreciated.  Respiratory/Chest: Grossly normal chest. (-) deformity. (-) Accessory muscle use.  Symmetric expansion. (-) Tenderness on palpation.  Resonant on percussion.  Diminished BS on both lower lung zones. (-) crackles, rhonchi. Occasional wheezing.  (-) egophony  Cardiovascular: Regular rate and  rhythm, heart sounds normal, no murmur or gallops, no peripheral edema  Gastrointestinal:  Normal bowel sounds. Soft, non-tender. No hepatosplenomegaly.  (-) masses.   Musculoskeletal:  Normal muscle tone. Normal gait.   Extremities: Grossly normal. (-) clubbing, cyanosis.  (-) edema  Skin: (-) rash,lesions seen.   Neurological/Psychiatric : alert, oriented to time, place, person. Normal mood and affect           Assessment & Plan:  Cough Cough since 06/2015. Complicated history.  See history.  Bottom line, pt is implicating exposure to propane at work as the cause of his  cough. He mentioned about workmans compensation during 12/2015 visit.   Chest CT scan (12/2015)  Mild scarring in RLL and lingula. (-) COPD/infiltrate PFT (12/2015)  Mild COPD BUT poor technique 2/2 coughing. Blood test ([facility], 12/2015) (-). IgE was 49.   Differentials: 1. Pt implicates exposure to propane while at work as cause of his cough.  He has had chronic cough during work, better with days off. On jan 15th or 16th of 2017, he felt "really very sick" at work with fevers, chills, nausea, vomiting, severe cough.  Has been coughing daily since that time.  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 Patient denies any history of asthma  or COPD or any lung problem preceding the cough.  2. UACS. Unlikely at this point.  3. GERD. This is my main differential for pt's cough. I think patient has underlying reflux disease and or abdominal pathology causing him to have reflux cough.  4. Hypersensitivity reaction/asthma type reaction. Unlikely at this point. 5. Patient is also being seen by cardiology for dyspnea. He scheduled for left heart cath middle of August. I doubt cardiac issues are causing him to cough. He does not seem to be in overt pulmonary edema.  Plan : 1. Continue Prilosec twice a day. 2. Start sucralfate 1 g 4 times a day. 3. Plan for abdominal ultrasound. Check for any liver problem. Check for any abdominal pathology. He may end up needing a GI consult to determine if he has bad reflux disease. 3. Told him to stop Dulera. He is not better with symbicort or dulera. Nebulizer medicines made his  cough worse.  4.  Rx for UACS. Cont  flonase, singulair, claritin. Plan to wean off these meds later.  5. Consider prednsione on f/u. He tried delsym which did NOT help.  6. Pt for LHC mid August.    GERD Cont PPI BID. Start sucralfate 1g QID. Abd [place].   Return to clinic in 4-6 weeks.   I spent at least 25 minutes with the patient today and more than 50% was spent  counseling the patient regarding disease and management and facilitating labs and medications.             Monica Becton, MD 01/23/2016   3:19 PM [facility] [place] Pager: [phone] Office: [phone], Fax: [phone]

## 2016-01-23 NOTE — Assessment & Plan Note (Signed)
Cough since 06/2015. Complicated history.  See history.  Bottom line, pt is implicating exposure to propane at work as the cause of his cough. He mentioned about workmans compensation during 12/2015 visit.   Chest CT scan (12/2015)  Mild scarring in RLL and lingula. (-) COPD/infiltrate PFT (12/2015)  Mild COPD BUT poor technique 2/2 coughing. Blood test ([facility], 12/2015) (-). IgE was 49.   Differentials: 1. Pt implicates exposure to propane while at work as cause of his cough.  He has had chronic cough during work, better with days off. On jan 15th or 16th of 2017, he felt "really very sick" at work with fevers, chills, nausea, vomiting, severe cough.  Has been coughing daily since that time.  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 Patient denies any history of asthma  or COPD or any lung problem preceding the cough.  2. UACS. Unlikely at this point.  3. GERD. This is my main differential for pt's cough. I think patient has underlying reflux disease and or abdominal pathology causing him to have reflux cough. 4. Hypersensitivity reaction/asthma type reaction. Unlikely at this point. 5. Patient is also being seen by cardiology for dyspnea. He scheduled for left heart cath middle of August. I doubt cardiac issues are causing him to cough. He does not seem to be in overt pulmonary edema.  Plan : 1. Continue Prilosec twice a day. 2. Start sucralfate 1 g 4 times a day. 3. Plan for abdominal ultrasound. Check for any liver problem. Check for any abdominal pathology. He may end up needing a GI consult to determine if he has bad reflux disease. 3. Told him to stop Dulera. He is not better with symbicort or dulera. Nebulizer medicines made his  cough worse.  4.  Rx for UACS. Cont  flonase, singulair, claritin. Plan to wean off these meds later.  5. Consider prednsione on f/u. He tried delsym which did NOT help.  6. Pt for LHC mid August.

## 2016-01-23 NOTE — Patient Instructions (Signed)
It was a pleasure taking care of you today!  Your cough is most likely multifactorial. Likely related to the following: A. Upper Airway Cough Syndrome  Cont Flonase 2 squirts per nostril at HS  Cont  Singulair 10 mg/tab, 1 tab at [facility]  Start Claritin  10 mg/tab, 1 tab in AM B. GERD  Continue Prilosec 2x/day  Start Sucralfate 1 g/tab 4x/day  Advised on dietary changes. Avoid food that will make your reflux worse.   Try to keep head of bed 30 degrees elevated.  Wait 2 hours at least after your last meal before you lie down C. Asthma-type reaction, allergies  Hold off on Dulera.   We will get an ultrasound of your abdomen.  We will try this plan and see if your cough gets better. Please call the office if your cough worsens while on treatment.   Return to clinic in 4-6 weeks with Dr. Corrie Dandy.

## 2016-01-23 NOTE — Addendum Note (Signed)
Addended by: Cristopher Estimable on: 01/23/2016 09:00 AM   Modules accepted: Orders

## 2016-01-23 NOTE — Assessment & Plan Note (Signed)
Cont PPI BID. Start sucralfate 1g QID. Abd [place].

## 2016-01-28 LAB — CBC WITH DIFFERENTIAL/PLATELET
BASOS PCT: 0 %
Basophils Absolute: 0 cells/uL (ref 0–200)
Eosinophils Absolute: 237 cells/uL (ref 15–500)
Eosinophils Relative: 3 %
HEMATOCRIT: 38.8 % (ref 38.5–50.0)
Hemoglobin: 12.8 g/dL — ABNORMAL LOW (ref 13.2–17.1)
LYMPHS PCT: 23 %
Lymphs Abs: 1817 cells/uL (ref 850–3900)
MCH: 28.4 pg (ref 27.0–33.0)
MCHC: 33 g/dL (ref 32.0–36.0)
MCV: 86 fL (ref 80.0–100.0)
MONOS PCT: 11 %
MPV: 9.2 fL (ref 7.5–12.5)
Monocytes Absolute: 869 cells/uL (ref 200–950)
NEUTROS PCT: 63 %
Neutro Abs: 4977 cells/uL (ref 1500–7800)
PLATELETS: 332 10*3/uL (ref 140–400)
RBC: 4.51 MIL/uL (ref 4.20–5.80)
RDW: 13.2 % (ref 11.0–15.0)
WBC: 7.9 10*3/uL (ref 3.8–10.8)

## 2016-01-29 LAB — BASIC METABOLIC PANEL
BUN: 16 mg/dL (ref 7–25)
CHLORIDE: 106 mmol/L (ref 98–110)
CO2: 24 mmol/L (ref 20–31)
CREATININE: 0.97 mg/dL (ref 0.70–1.33)
Calcium: 8.9 mg/dL (ref 8.6–10.3)
Glucose, Bld: 92 mg/dL (ref 65–99)
Potassium: 4.6 mmol/L (ref 3.5–5.3)
Sodium: 139 mmol/L (ref 135–146)

## 2016-01-29 LAB — PROTIME-INR
INR: 1
PROTHROMBIN TIME: 10.8 s (ref 9.0–11.5)

## 2016-01-29 LAB — APTT: APTT: 36 s — AB (ref 22–34)

## 2016-01-30 ENCOUNTER — Other Ambulatory Visit: Payer: Self-pay

## 2016-01-30 ENCOUNTER — Ambulatory Visit (HOSPITAL_COMMUNITY)
Admission: RE | Admit: 2016-01-30 | Discharge: 2016-01-30 | Disposition: A | Discharge status: Home / Self Care | Payer: BLUE CROSS/BLUE SHIELD | Source: Ambulatory Visit | Attending: Pulmonary Disease | Admitting: Pulmonary Disease

## 2016-01-30 ENCOUNTER — Ambulatory Visit (HOSPITAL_BASED_OUTPATIENT_CLINIC_OR_DEPARTMENT_OTHER): Payer: BLUE CROSS/BLUE SHIELD

## 2016-01-30 DIAGNOSIS — Z01818 Encounter for other preprocedural examination: Secondary | ICD-10-CM

## 2016-01-30 DIAGNOSIS — Z79899 Other long term (current) drug therapy: Secondary | ICD-10-CM | POA: Diagnosis not present

## 2016-01-30 DIAGNOSIS — R079 Chest pain, unspecified: Secondary | ICD-10-CM | POA: Diagnosis present

## 2016-01-30 DIAGNOSIS — K219 Gastro-esophageal reflux disease without esophagitis: Secondary | ICD-10-CM

## 2016-01-30 DIAGNOSIS — Z01810 Encounter for preprocedural cardiovascular examination: Secondary | ICD-10-CM | POA: Insufficient documentation

## 2016-01-30 DIAGNOSIS — E785 Hyperlipidemia, unspecified: Secondary | ICD-10-CM | POA: Insufficient documentation

## 2016-01-30 DIAGNOSIS — I119 Hypertensive heart disease without heart failure: Secondary | ICD-10-CM | POA: Insufficient documentation

## 2016-01-30 DIAGNOSIS — I351 Nonrheumatic aortic (valve) insufficiency: Secondary | ICD-10-CM | POA: Diagnosis not present

## 2016-02-02 ENCOUNTER — Ambulatory Visit: Payer: BLUE CROSS/BLUE SHIELD | Admitting: Pulmonary Disease

## 2016-02-02 ENCOUNTER — Ambulatory Visit (HOSPITAL_COMMUNITY)
Admission: RE | Admit: 2016-02-02 | Discharge: 2016-02-02 | Disposition: A | Discharge status: Home / Self Care | Payer: BLUE CROSS/BLUE SHIELD | Source: Ambulatory Visit | Attending: Pulmonary Disease | Admitting: Pulmonary Disease

## 2016-02-02 DIAGNOSIS — R932 Abnormal findings on diagnostic imaging of liver and biliary tract: Secondary | ICD-10-CM | POA: Insufficient documentation

## 2016-02-02 DIAGNOSIS — K219 Gastro-esophageal reflux disease without esophagitis: Secondary | ICD-10-CM | POA: Diagnosis not present

## 2016-02-04 ENCOUNTER — Telehealth: Payer: Self-pay | Admitting: Cardiovascular Disease

## 2016-02-04 ENCOUNTER — Other Ambulatory Visit: Payer: Self-pay | Admitting: Nurse Practitioner

## 2016-02-04 NOTE — Telephone Encounter (Signed)
Patient's mother called and wanted to know what floor the catheterization was going to be on.  I told them to go to the main entrance where they could have [place] parking of their car.  To go to the admissions desk and they would be told where to go.

## 2016-02-05 ENCOUNTER — Encounter (HOSPITAL_COMMUNITY): Payer: Self-pay | Admitting: Cardiovascular Disease

## 2016-02-05 ENCOUNTER — Encounter (HOSPITAL_COMMUNITY)
Admission: RE | Disposition: A | Discharge status: Home / Self Care | Payer: Self-pay | Source: Ambulatory Visit | Attending: Cardiovascular Disease

## 2016-02-05 ENCOUNTER — Ambulatory Visit (HOSPITAL_COMMUNITY)
Admission: RE | Admit: 2016-02-05 | Discharge: 2016-02-06 | Disposition: A | Discharge status: Home / Self Care | Payer: BLUE CROSS/BLUE SHIELD | Source: Ambulatory Visit | Attending: Cardiovascular Disease | Admitting: Cardiovascular Disease

## 2016-02-05 DIAGNOSIS — I10 Essential (primary) hypertension: Secondary | ICD-10-CM | POA: Insufficient documentation

## 2016-02-05 DIAGNOSIS — I4901 Ventricular fibrillation: Secondary | ICD-10-CM

## 2016-02-05 DIAGNOSIS — I2510 Atherosclerotic heart disease of native coronary artery without angina pectoris: Secondary | ICD-10-CM | POA: Diagnosis present

## 2016-02-05 DIAGNOSIS — Z8249 Family history of ischemic heart disease and other diseases of the circulatory system: Secondary | ICD-10-CM | POA: Diagnosis not present

## 2016-02-05 DIAGNOSIS — R079 Chest pain, unspecified: Secondary | ICD-10-CM

## 2016-02-05 DIAGNOSIS — Z7982 Long term (current) use of aspirin: Secondary | ICD-10-CM | POA: Insufficient documentation

## 2016-02-05 DIAGNOSIS — Z7951 Long term (current) use of inhaled steroids: Secondary | ICD-10-CM | POA: Insufficient documentation

## 2016-02-05 DIAGNOSIS — E785 Hyperlipidemia, unspecified: Secondary | ICD-10-CM | POA: Diagnosis not present

## 2016-02-05 DIAGNOSIS — I9789 Other postprocedural complications and disorders of the circulatory system, not elsewhere classified: Secondary | ICD-10-CM | POA: Insufficient documentation

## 2016-02-05 DIAGNOSIS — R0789 Other chest pain: Secondary | ICD-10-CM | POA: Diagnosis not present

## 2016-02-05 DIAGNOSIS — I498 Other specified cardiac arrhythmias: Secondary | ICD-10-CM | POA: Insufficient documentation

## 2016-02-05 DIAGNOSIS — Y840 Cardiac catheterization as the cause of abnormal reaction of the patient, or of later complication, without mention of misadventure at the time of the procedure: Secondary | ICD-10-CM | POA: Insufficient documentation

## 2016-02-05 DIAGNOSIS — Z9861 Coronary angioplasty status: Secondary | ICD-10-CM

## 2016-02-05 HISTORY — PX: CARDIAC CATHETERIZATION: SHX172

## 2016-02-05 LAB — MRSA PCR SCREENING: MRSA by PCR: NEGATIVE

## 2016-02-05 SURGERY — LEFT HEART CATH AND CORONARY ANGIOGRAPHY

## 2016-02-05 MED ORDER — ONDANSETRON HCL 4 MG/2ML IJ SOLN
INTRAMUSCULAR | Status: DC | PRN
  Administered 2016-02-05: 4 mg via INTRAVENOUS

## 2016-02-05 MED ORDER — DOXAZOSIN MESYLATE 4 MG PO TABS
4.0000 mg | ORAL_TABLET | Freq: Two times a day (BID) | ORAL | Status: DC
  Administered 2016-02-06: 4 mg via ORAL
  Filled 2016-02-05 (×2): qty 1

## 2016-02-05 MED ORDER — MORPHINE SULFATE (PF) 2 MG/ML IV SOLN
INTRAVENOUS | Status: AC
  Filled 2016-02-05: qty 1

## 2016-02-05 MED ORDER — SODIUM CHLORIDE 0.9% FLUSH: 3.0000 mL | INTRAVENOUS | Status: DC | PRN

## 2016-02-05 MED ORDER — HEPARIN (PORCINE) IN NACL 2-0.9 UNIT/ML-% IJ SOLN
INTRAMUSCULAR | Status: AC
  Filled 2016-02-05: qty 1000

## 2016-02-05 MED ORDER — MOMETASONE FURO-FORMOTEROL FUM 100-5 MCG/ACT IN AERO
2.0000 | INHALATION_SPRAY | Freq: Two times a day (BID) | RESPIRATORY_TRACT | Status: DC
  Filled 2016-02-05: qty 8.8

## 2016-02-05 MED ORDER — SODIUM CHLORIDE 0.9% FLUSH
3.0000 mL | Freq: Two times a day (BID) | INTRAVENOUS | Status: DC
  Administered 2016-02-05: 3 mL via INTRAVENOUS

## 2016-02-05 MED ORDER — VERAPAMIL HCL 2.5 MG/ML IV SOLN
INTRA_ARTERIAL | Status: DC | PRN
  Administered 2016-02-05: 15 mL via INTRA_ARTERIAL

## 2016-02-05 MED ORDER — IOPAMIDOL (ISOVUE-370) INJECTION 76%
INTRAVENOUS | Status: AC
  Filled 2016-02-05: qty 100

## 2016-02-05 MED ORDER — SODIUM CHLORIDE 0.9 % IV SOLN: INTRAVENOUS | Status: AC

## 2016-02-05 MED ORDER — HEPARIN SODIUM (PORCINE) 1000 UNIT/ML IJ SOLN
INTRAMUSCULAR | Status: AC
  Filled 2016-02-05: qty 1

## 2016-02-05 MED ORDER — ONDANSETRON HCL 4 MG/2ML IJ SOLN: 4.0000 mg | Freq: Four times a day (QID) | INTRAMUSCULAR | Status: DC | PRN

## 2016-02-05 MED ORDER — OMEPRAZOLE 20 MG PO CPDR
40.0000 mg | DELAYED_RELEASE_CAPSULE | Freq: Every day | ORAL | Status: DC
  Filled 2016-02-05 (×2): qty 2

## 2016-02-05 MED ORDER — VERAPAMIL HCL 2.5 MG/ML IV SOLN
INTRAVENOUS | Status: AC
  Filled 2016-02-05: qty 2

## 2016-02-05 MED ORDER — ASPIRIN 81 MG PO CHEW: 81.0000 mg | CHEWABLE_TABLET | Freq: Every day | ORAL | Status: DC

## 2016-02-05 MED ORDER — LIDOCAINE HCL (PF) 1 % IJ SOLN
INTRAMUSCULAR | Status: DC | PRN
  Administered 2016-02-05: 2 mL

## 2016-02-05 MED ORDER — ALBUTEROL SULFATE (2.5 MG/3ML) 0.083% IN NEBU: 3.0000 mL | INHALATION_SOLUTION | Freq: Four times a day (QID) | RESPIRATORY_TRACT | Status: DC | PRN

## 2016-02-05 MED ORDER — ASPIRIN 81 MG PO CHEW
81.0000 mg | CHEWABLE_TABLET | ORAL | Status: AC
  Administered 2016-02-05: 81 mg via ORAL

## 2016-02-05 MED ORDER — MORPHINE SULFATE (PF) 2 MG/ML IV SOLN
2.0000 mg | INTRAVENOUS | Status: DC | PRN
  Administered 2016-02-05: 2 mg via INTRAVENOUS

## 2016-02-05 MED ORDER — MONTELUKAST SODIUM 10 MG PO TABS
10.0000 mg | ORAL_TABLET | Freq: Every day | ORAL | Status: DC
  Administered 2016-02-05: 10 mg via ORAL
  Filled 2016-02-05: qty 1

## 2016-02-05 MED ORDER — ASPIRIN 81 MG PO CHEW
CHEWABLE_TABLET | ORAL | Status: AC
  Administered 2016-02-05: 81 mg via ORAL
  Filled 2016-02-05: qty 1

## 2016-02-05 MED ORDER — LIDOCAINE HCL (PF) 1 % IJ SOLN
INTRAMUSCULAR | Status: AC
  Filled 2016-02-05: qty 30

## 2016-02-05 MED ORDER — SODIUM CHLORIDE 0.9 % WEIGHT BASED INFUSION
3.0000 mL/kg/h | INTRAVENOUS | Status: DC
  Administered 2016-02-05: 3 mL/kg/h via INTRAVENOUS

## 2016-02-05 MED ORDER — ASPIRIN EC 81 MG PO TBEC
81.0000 mg | DELAYED_RELEASE_TABLET | Freq: Every day | ORAL | Status: DC
  Filled 2016-02-05: qty 1

## 2016-02-05 MED ORDER — EPINEPHRINE HCL 0.1 MG/ML IJ SOSY
PREFILLED_SYRINGE | INTRAMUSCULAR | Status: AC
  Filled 2016-02-05: qty 10

## 2016-02-05 MED ORDER — ACETAMINOPHEN 325 MG PO TABS: 650.0000 mg | ORAL_TABLET | ORAL | Status: DC | PRN

## 2016-02-05 MED ORDER — HEPARIN (PORCINE) IN NACL 2-0.9 UNIT/ML-% IJ SOLN
INTRAMUSCULAR | Status: DC | PRN
  Administered 2016-02-05: 1000 mL

## 2016-02-05 MED ORDER — HEPARIN SODIUM (PORCINE) 1000 UNIT/ML IJ SOLN
INTRAMUSCULAR | Status: DC | PRN
  Administered 2016-02-05: 4500 [IU] via INTRAVENOUS

## 2016-02-05 MED ORDER — OMEPRAZOLE MAGNESIUM 20 MG PO TBEC
40.0000 mg | DELAYED_RELEASE_TABLET | Freq: Every day | ORAL | Status: DC
  Filled 2016-02-05: qty 2

## 2016-02-05 MED ORDER — AMIODARONE HCL 150 MG/3ML IV SOLN
INTRAVENOUS | Status: AC
  Filled 2016-02-05: qty 3

## 2016-02-05 MED ORDER — ONDANSETRON HCL 4 MG/2ML IJ SOLN
INTRAMUSCULAR | Status: AC
  Filled 2016-02-05: qty 2

## 2016-02-05 MED ORDER — AMIODARONE LOAD VIA INFUSION
INTRAVENOUS | Status: DC | PRN
  Administered 2016-02-05: 150 mg via INTRAVENOUS

## 2016-02-05 MED ORDER — FLUTICASONE PROPIONATE 50 MCG/ACT NA SUSP
2.0000 | Freq: Every day | NASAL | Status: DC
  Administered 2016-02-05: 2 via NASAL
  Filled 2016-02-05: qty 16

## 2016-02-05 MED ORDER — IOPAMIDOL (ISOVUE-370) INJECTION 76%
INTRAVENOUS | Status: DC | PRN
  Administered 2016-02-05: 50 mL via INTRA_ARTERIAL

## 2016-02-05 MED ORDER — ATORVASTATIN CALCIUM 10 MG PO TABS
10.0000 mg | ORAL_TABLET | Freq: Every day | ORAL | Status: DC
  Administered 2016-02-05: 10 mg via ORAL
  Filled 2016-02-05: qty 1

## 2016-02-05 MED ORDER — SODIUM CHLORIDE 0.9 % WEIGHT BASED INFUSION
1.0000 mL/kg/h | INTRAVENOUS | Status: DC
  Administered 2016-02-05: 500 mL via INTRAVENOUS

## 2016-02-05 MED ORDER — EPINEPHRINE HCL 0.1 MG/ML IJ SOSY
PREFILLED_SYRINGE | INTRAMUSCULAR | Status: DC | PRN
  Administered 2016-02-05: 1 mg via INTRAVENOUS

## 2016-02-05 MED ORDER — BUDESONIDE 0.5 MG/2ML IN SUSP
0.5000 mg | Freq: Two times a day (BID) | RESPIRATORY_TRACT | Status: DC
  Filled 2016-02-05 (×2): qty 2

## 2016-02-05 MED ORDER — SODIUM CHLORIDE 0.9 % IV SOLN: 250.0000 mL | INTRAVENOUS | Status: DC | PRN

## 2016-02-05 SURGICAL SUPPLY — 12 items
CATH OPTITORQUE TIG 4.0 5F (CATHETERS) ×1 IMPLANT
DEVICE RAD COMP TR BAND LRG (VASCULAR PRODUCTS) ×1 IMPLANT
ELECT DEFIB PAD ADLT CADENCE (PAD) ×2 IMPLANT
GLIDESHEATH SLEND A-KIT 6F 22G (SHEATH) IMPLANT
GLIDESHEATH SLEND SS 6F .021 (SHEATH) ×1 IMPLANT
KIT HEART LEFT (KITS) ×2 IMPLANT
PACK CARDIAC CATHETERIZATION (CUSTOM PROCEDURE TRAY) ×2 IMPLANT
SYR MEDRAD MARK V 150ML (SYRINGE) ×2 IMPLANT
TRANSDUCER W/STOPCOCK (MISCELLANEOUS) ×2 IMPLANT
TUBING CIL FLEX 10 FLL-RA (TUBING) ×2 IMPLANT
WIRE HI TORQ VERSACORE-J 145CM (WIRE) ×1 IMPLANT
WIRE SAFE-T 1.5MM-J .035X260CM (WIRE) ×1 IMPLANT

## 2016-02-05 NOTE — H&P (View-Only) (Signed)
01/21/2016 [name]   1955/09/21  [id number]  Primary Physician Walker Kehr, MD Primary Cardiologist: Lorretta Harp MD Renae Gloss  HPI:  [name] is a 60 year old single [place] male whose mother is a patient of mine. He has a history of treated hypertension, hyperlipidemia and family history for heart disease. He is complained of chest pain off and on for several years. Somewhat exertional. He did have a stress test done in January that was low risk. He's had a persistent cough over the last 6 months. Recent chest CT performed 01/08/16 showed no significant pulmonary abnormalities but there was a small pericardial effusion and calcium in the LAD territory.   Current Outpatient Prescriptions  Medication Sig Dispense Refill  . aspirin EC 81 MG tablet Take 81 mg by mouth daily.    Marland Kitchen atorvastatin (LIPITOR) 10 MG tablet Take 1 tablet (10 mg total) by mouth daily. 30 tablet 11  . budesonide (PULMICORT) 0.5 MG/2ML nebulizer solution Take 2 mLs (0.5 mg total) by nebulization 2 (two) times daily. 120 mL 5  . cholecalciferol (VITAMIN D) 1000 UNITS tablet Take 1 tablet (1,000 Units total) by mouth daily. 100 tablet 3  . doxazosin (CARDURA) 8 MG tablet Take 0.5 tablets (4 mg total) by mouth 2 (two) times daily. 90 tablet 1  . fluticasone (FLONASE) 50 MCG/ACT nasal spray Place 2 sprays into both nostrils at bedtime. 16 g 5  . loratadine (CLARITIN) 10 MG tablet Take 1 tablet (10 mg total) by mouth daily. 30 tablet 11  . mometasone-formoterol (DULERA) 100-5 MCG/ACT AERO Take 2 puffs first thing in am and then another 2 puffs about 12 hours later. 1 Inhaler 11  . montelukast (SINGULAIR) 10 MG tablet Take 1 tablet (10 mg total) by mouth at bedtime. 30 tablet 5  . omeprazole (PRILOSEC OTC) 20 MG tablet Take 2 tablets (40 mg total) by mouth daily. 60 tablet 5   No current facility-administered medications for this visit.     Allergies  Allergen Reactions  . Atorvastatin      REACTION: leg cramps per patient with a large dose  . Flomax [Tamsulosin Hcl]     Hard time breathing    Social History   Social History  . Marital status: Divorced    Spouse name: N/A  . Number of children: 1  . Years of education: N/A   Occupational History  . Animal nutritionist    Social History Main Topics  . Smoking status: Never Smoker  . Smokeless tobacco: Never Used  . Alcohol use No     Comment: rare  . Drug use: No  . Sexual activity: No   Other Topics Concern  . Not on file   Social History Narrative   Coffee daily      Review of Systems: General: negative for chills, fever, night sweats or weight changes.  Cardiovascular: negative for chest pain, dyspnea on exertion, edema, orthopnea, palpitations, paroxysmal nocturnal dyspnea or shortness of breath Dermatological: negative for rash Respiratory: negative for cough or wheezing Urologic: negative for hematuria Abdominal: negative for nausea, vomiting, diarrhea, bright red blood per rectum, melena, or hematemesis Neurologic: negative for visual changes, syncope, or dizziness All other systems reviewed and are otherwise negative except as noted above.    Blood pressure 108/72, pulse 93, height 5\' 9"  (1.753 m), weight 203 lb (92.1 kg).  General appearance: alert and no distress Neck: no adenopathy, no carotid bruit, no JVD, supple, symmetrical, trachea midline and  thyroid not enlarged, symmetric, no tenderness/mass/nodules Lungs: clear to auscultation bilaterally Heart: regular rate and rhythm, S1, S2 normal, no murmur, click, rub or gallop Extremities: extremities normal, atraumatic, no cyanosis or edema  EKG normal sinus rhythm at 93 with borderline evidence of left ventricular hypertrophy. Percentage of this EKG  ASSESSMENT AND PLAN:   Dyslipidemia History of hyperlipidemia on Lipitor with recent lipid profile performed 07/30/15 revealed a total cholesterol 176, triglyceride level of 238 and HDL of 41.  His LDLs in the past have been in the 80-90 range.  Essential hypertension History of hypertension blood pressure measured 108/72. He is on Cardura. Continue current meds at current dosing  Chest pain with moderate risk of acute coronary syndrome History of ongoing chest pain was somewhat exertional. He did have a negative Myoview stress test performed 07/15/15. 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 Because of his risk factors including hypertension, hyperlipidemia and family history I'm going to proceed with outpatient diagnostic coronary arteriography via the right radial approach.  I have reviewed the risks, indications, and alternatives to cardiac catheterization, possible angioplasty, and stenting with the patient. Risks include but are not limited to bleeding, infection, vascular injury, stroke, myocardial infection, arrhythmia, kidney injury, radiation-related injury in the case of prolonged fluoroscopy use, emergency cardiac surgery, and death. The patient understands the risks of serious complication is 1-2 in [id number] with diagnostic cardiac cath and 1-2% or less with angioplasty/stenting.       Lorretta Harp MD FACP,FACC,FAHA, [facility] 01/21/2016 10:15 AM

## 2016-02-05 NOTE — Interval H&P Note (Signed)
Cath Lab Visit (complete for each Cath Lab visit)  Clinical Evaluation Leading to the Procedure:   ACS: No.  Non-ACS:    Anginal Classification: CCS II  Anti-ischemic medical therapy: No Therapy  Non-Invasive Test Results: Low-risk stress test findings: cardiac mortality <1%/year  Prior CABG: No previous CABG      History and Physical Interval Note:  02/05/2016 7:33 AM  [name]  has presented today for surgery, with the diagnosis of cp  The various methods of treatment have been discussed with the patient and family. After consideration of risks, benefits and other options for treatment, the patient has consented to  Procedure(s): Left Heart Cath and Coronary Angiography (N/A) as a surgical intervention .  The patient's history has been reviewed, patient examined, no change in status, stable for surgery.  I have reviewed the patient's chart and labs.  Questions were answered to the patient's satisfaction.     Quay Burow

## 2016-02-05 NOTE — Progress Notes (Signed)
Admission history taken with [place] interpreter alfia danielson.

## 2016-02-05 NOTE — Progress Notes (Signed)
TR band removed gauze 2x2 and tegaderm applied.no bleeding noted. Continue to monitor.

## 2016-02-06 ENCOUNTER — Encounter: Payer: Self-pay | Admitting: Cardiovascular Disease

## 2016-02-06 ENCOUNTER — Telehealth: Payer: Self-pay | Admitting: Cardiovascular Disease

## 2016-02-06 DIAGNOSIS — I2510 Atherosclerotic heart disease of native coronary artery without angina pectoris: Secondary | ICD-10-CM

## 2016-02-06 LAB — BASIC METABOLIC PANEL
ANION GAP: 6 (ref 5–15)
BUN: 9 mg/dL (ref 6–20)
CALCIUM: 8.7 mg/dL — AB (ref 8.9–10.3)
CHLORIDE: 104 mmol/L (ref 101–111)
CO2: 28 mmol/L (ref 22–32)
Creatinine, Ser: 1.04 mg/dL (ref 0.61–1.24)
GFR calc non Af Amer: >60 mL/min (ref >60)
GLUCOSE: 112 mg/dL — AB (ref 65–99)
POTASSIUM: 4.2 mmol/L (ref 3.5–5.1)
Sodium: 138 mmol/L (ref 135–145)

## 2016-02-06 LAB — CBC
HEMATOCRIT: 37.9 % — AB (ref 39.0–52.0)
HEMOGLOBIN: 12 g/dL — AB (ref 13.0–17.0)
MCH: 27.8 pg (ref 26.0–34.0)
MCHC: 31.7 g/dL (ref 30.0–36.0)
MCV: 87.9 fL (ref 78.0–100.0)
Platelets: 302 10*3/uL (ref 150–400)
RBC: 4.31 MIL/uL (ref 4.22–5.81)
RDW: 13.3 % (ref 11.5–15.5)
WBC: 8.8 10*3/uL (ref 4.0–10.5)

## 2016-02-06 MED ORDER — CLOPIDOGREL BISULFATE 75 MG PO TABS
75.0000 mg | ORAL_TABLET | Freq: Every day | ORAL | Status: DC
  Administered 2016-02-06: 75 mg via ORAL
  Filled 2016-02-06: qty 1

## 2016-02-06 MED ORDER — AMLODIPINE BESYLATE 2.5 MG PO TABS
1.2500 mg | ORAL_TABLET | Freq: Every day | ORAL | Status: DC
  Filled 2016-02-06: qty 1

## 2016-02-06 MED ORDER — CLOPIDOGREL BISULFATE 75 MG PO TABS: 75.0000 mg | ORAL_TABLET | Freq: Every day | ORAL | 11 refills | Status: DC

## 2016-02-06 MED ORDER — AMLODIPINE BESYLATE 2.5 MG PO TABS: 1.2500 mg | ORAL_TABLET | Freq: Every day | ORAL | 11 refills | Status: DC

## 2016-02-06 NOTE — Progress Notes (Signed)
Discharged home by wheelchair, stable, discharged instructions given to pt. and son who speaks english good. Belongings with pt.

## 2016-02-06 NOTE — Progress Notes (Addendum)
   Subjective: No CP  No SOB   Objective: Vitals:   02/06/16 0300 02/06/16 0400 02/06/16 0500 02/06/16 0600  BP:  116/75  107/67  Pulse: (!) 59 61 65   Resp: 19 11 17 11   Temp:  98 F (36.7 C)    TempSrc:  Oral    SpO2: 99% 98% 97%   Weight:      Height:       Weight change: -1 lb 3.5 oz (-0.551 kg)  Intake/Output Summary (Last 24 hours) at 02/06/16 0718 Last data filed at 02/06/16 0600  Gross per 24 hour  Intake              668 ml  Output             1595 ml  Net             -927 ml    General: Alert, awake, oriented x3, in no acute distress Neck:  JVP is normal Heart: Regular rate and rhythm, without murmurs, rubs, gallops.  Lungs: Clear to auscultation.  No rales or wheezes. Exemities:  No edema.   Neuro: Grossly intact, nonfocal.  Tele:  SR   Lab Results: Results for orders placed or performed during the hospital encounter of 02/05/16 (from the past 24 hour(s))  MRSA PCR Screening     Status: None   Collection Time: 02/05/16 10:52 AM  Result Value Ref Range   MRSA by PCR NEGATIVE NEGATIVE  CBC     Status: Abnormal   Collection Time: 02/06/16  3:05 AM  Result Value Ref Range   WBC 8.8 4.0 - 10.5 K/uL   RBC 4.31 4.22 - 5.81 MIL/uL   Hemoglobin 12.0 (L) 13.0 - 17.0 g/dL   HCT 37.9 (L) 39.0 - 52.0 %   MCV 87.9 78.0 - 100.0 fL   MCH 27.8 26.0 - 34.0 pg   MCHC 31.7 30.0 - 36.0 g/dL   RDW 13.3 11.5 - 15.5 %   Platelets 302 150 - 400 K/uL  Basic metabolic panel     Status: Abnormal   Collection Time: 02/06/16  3:05 AM  Result Value Ref Range   Sodium 138 135 - 145 mmol/L   Potassium 4.2 3.5 - 5.1 mmol/L   Chloride 104 101 - 111 mmol/L   CO2 28 22 - 32 mmol/L   Glucose, Bld 112 (H) 65 - 99 mg/dL   BUN 9 6 - 20 mg/dL   Creatinine, Ser 1.04 0.61 - 1.24 mg/dL   Calcium 8.7 (L) 8.9 - 10.3 mg/dL   GFR calc non Af Amer >60 >60 mL/min   GFR calc Af Amer >60 >60 mL/min   Anion gap 6 5 - 15    Studies/Results: No results found.  Medications: REviewed      @PROBHOSP @  1  CAD  Cath yesterday showed 60% RCA lesion  During procedure pt did fibrillate requiring CPR/defib  Reviewed with Adora Fridge  He will plan on seeing pt in clinic  With follow up intervention on RCA  (FFR not done due to fibrillation) Will start plavix 75  Very low dose amlodipine 1.25 mg   2  HTN  BP is OK    3  HL  Keep on lipitor   LOS: 0 days   Dorris Carnes 02/06/2016, 7:18 AM

## 2016-02-06 NOTE — Telephone Encounter (Signed)
New Message  Pt mother call requesting to speak with RN about pt receiving a second cath procedure.  Please call back to discuss

## 2016-02-06 NOTE — Telephone Encounter (Signed)
Pt currently admitted at [facility]. Patient mother not listed on DPR.

## 2016-02-06 NOTE — Discharge Summary (Signed)
Discharge Summary    Patient ID: [name],  MRN: [id number], DOB/AGE: 1955-10-12 60 y.o.  Admit date: 02/05/2016 Discharge date: 02/06/2016  Primary Care Provider: Walker Kehr Primary Cardiologist: Dr. Gwenlyn Found   Discharge Diagnoses    Active Problems:   Chest pain with moderate risk of acute coronary syndrome   VF (ventricular fibrillation) (HCC)   CAD (coronary artery disease)   Allergies Allergies  Allergen Reactions  . Atorvastatin     REACTION: leg cramps per patient with a large dose  . Flomax [Tamsulosin Hcl]     Hard time breathing    Diagnostic Studies/Procedures    LHC: 02/05/2016  IMPRESSION: [name] has mild narrowing of the distal left main disease does not appear to be significant and has a 60% hypodense proximal to mid dominant RCA stenosis. He did fibrillate after the third coronary injection requiring CPR and defibrillation. I was not damped nor was I engaged in the conus branch. Based on this, his symptoms I feel the RCA should be percutaneously addressed. I will keep him in to a stepdown overnight and discharged home in the morning. We will arrange for him to have elective RCA PCI and stenting in the next one to 2 weeks. He left the lab awake, alert and hemodynamic stable.  Quay Burow. MD, [facility] 02/05/2016 8:14 AM _____________   History of Present Illness     [name] is a 60 yo r[place]ian male patient of Dr. Gwenlyn Found with a PMH of HTN/HLD and family hx of heart disease. He reports chest pain intermittently for the past couple of years, that is somewhat exertional. Did undergo a stress test back in January that was low risk, and recent CT 01/08/16 showing no significant pulmonary abnormalities, but small pericardial effusion and calcium in the LAD territory. Given his history of chest pain and risk factors it was planned to proceed with a LHC with Dr. Gwenlyn Found.   Hospital Course     Consultants: None  He presented for an  outpatient LHC on 02/05/2016 with Dr. Gwenlyn Found showing 60% prox to mid dominant RCA stenosis. During the procedure he did fibrillate after the third coronary injection requiring CPR and defibrillation. According to Dr. Gwenlyn Found, based on this event he felt the RCA should be approached percutaneously. He was admitted overnight to stepdown and observed, with a plan to discharge the following morning.   On 02/06/2016 he was seen and assessed by Dr. Harrington Challenger and determined stable for discharge home. No further cardiac events throughout the night. Dr. Gwenlyn Found plans to arrange for elective RCA PCI and stenting within the coming weeks. His morning labs were noted to be stable, along with vital signs. He will be started on Plavix and low dose of amlodipine 1.25mg  daily, with continuation of statin. I have made close follow up for him with Dr. Gwenlyn Found on 8/23.  _____________  Discharge Vitals Blood pressure 117/77, pulse 63, temperature 98 F (36.7 C), temperature source Oral, resp. rate 12, height 5\' 9"  (1.753 m), weight 193 lb 12.6 oz (87.9 kg), SpO2 98 %.  Filed Weights   02/05/16 0545 02/05/16 1047  Weight: 195 lb (88.5 kg) 193 lb 12.6 oz (87.9 kg)    Labs & Radiologic Studies    CBC  Recent Labs  02/06/16 0305  WBC 8.8  HGB 12.0*  HCT 37.9*  MCV 87.9  PLT 99991111   Basic Metabolic Panel  Recent Labs  02/06/16 0305  NA 138  K 4.2  CL  104  CO2 28  GLUCOSE 112*  BUN 9  CREATININE 1.04  CALCIUM 8.7*   Liver Function Tests No results for input(s): AST, ALT, ALKPHOS, BILITOT, PROT, ALBUMIN in the last 72 hours. No results for input(s): LIPASE, AMYLASE in the last 72 hours. Cardiac Enzymes No results for input(s): CKTOTAL, CKMB, CKMBINDEX, TROPONINI in the last 72 hours. BNP Invalid input(s): POCBNP D-Dimer No results for input(s): DDIMER in the last 72 hours. Hemoglobin A1C No results for input(s): HGBA1C in the last 72 hours. Fasting Lipid Panel No results for input(s): CHOL, HDL, LDLCALC,  TRIG, CHOLHDL, LDLDIRECT in the last 72 hours. Thyroid Function Tests No results for input(s): TSH, T4TOTAL, T3FREE, THYROIDAB in the last 72 hours.  Invalid input(s): FREET3 _____________  Ct Chest Wo Contrast  Result Date: 01/08/2016 CLINICAL DATA:  Persistent dry cough since January of 2017. Shortness of breath. No prior history of lung disease. EXAM: CT CHEST WITHOUT CONTRAST TECHNIQUE: Multidetector CT imaging of the chest was performed following the standard protocol without IV contrast. COMPARISON:  Radiography 11/18/2015, 07/09/2015 and as distant as 10/06/2013 FINDINGS: Cardiovascular: Heart size is normal. The patient has a small but abnormal pericardial effusion. Early calcification is seen in the region of the aortic valve. Coronary artery calcification is present in the left system. The aorta appears normal. Mediastinum/Nodes: No enlarged mediastinal or hilar lymph nodes. Lungs/Pleura: No pleural fluid. In the right lower lobe, there is scarring/volume loss anteriorly. On the left, there is mild scarring/volume loss in the lingula. No focal pulmonary lesion. Upper Abdomen: Normal Musculoskeletal: Lower cervical degenerative changes. 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 IMPRESSION: Areas of linear scarring in the right lower lobe and in the lingula. Otherwise, the lungs appear normal. No evidence of emphysema or other diffuse process. Small but abnormal pericardial effusion. Early calcification affecting the aortic valve. Calcification in the left coronary system. Electronically Signed   By: Nelson Chimes M.D.   On: 01/08/2016 08:53   US Abdomen Complete  Result Date: 02/02/2016 CLINICAL DATA:  GERD, evaluate for liver abnormality EXAM: ABDOMEN ULTRASOUND COMPLETE COMPARISON:  None. FINDINGS: Gallbladder: No gallstones, gallbladder wall thickening, or pericholecystic fluid. Negative sonographic Murphy's sign. Common bile duct: Diameter: 2  mm Liver: Hyperechoic hepatic parenchyma, suggesting hepatic steatosis. No focal hepatic lesion is seen IVC: No abnormality visualized. Pancreas: Poorly visualized. Spleen: Size and appearance within normal limits. Right Kidney: Length: 11.4 cm.  No mass or hydronephrosis. Left Kidney: Length: 11.9 cm.  No mass or hydronephrosis Abdominal aorta: No aneurysm visualized. Other findings: None. IMPRESSION: Hyperechoic hepatic parenchyma, suggesting hepatic steatosis. Otherwise negative abdominal ultrasound. Electronically Signed   By: Julian Hy M.D.   On: 02/02/2016 08:25   Disposition   Pt is being discharged home today in good condition.  Follow-up Plans & Appointments    Follow-up Information    Quay Burow, MD Follow up on 02/11/2016.   Specialties:  Cardiology, Radiology Why:  10:30am for your follow up with Dr. Gwenlyn Found.  Contact information: [address] [place] [address] [phone]          Discharge Instructions    Call MD for:  redness, tenderness, or signs of infection (pain, swelling, redness, odor or green/yellow discharge around incision site)    Complete by:  As directed   Diet - low sodium heart healthy    Complete by:  As directed   Discharge instructions    Complete by:  As directed  Radial Site Care Refer to this sheet in the next few weeks. These instructions provide you with information on caring for yourself after your procedure. Your caregiver may also give you more specific instructions. Your treatment has been planned according to current medical practices, but problems sometimes occur. Call your caregiver if you have any problems or questions after your procedure. HOME CARE INSTRUCTIONS You may shower the day after the procedure.Remove the bandage (dressing) and gently wash the site with plain soap and water.Gently pat the site dry.  Do not apply powder or lotion to the site.  Do not submerge the affected site in water for 3 to 5  days.  Inspect the site at least twice daily.  Do not flex or bend the affected arm for 24 hours.  No lifting over 5 pounds (2.3 kg) for 5 days after your procedure.  Do not drive home if you are discharged the same day of the procedure. Have someone else drive you.  You may drive 24 hours after the procedure unless otherwise instructed by your caregiver.  What to expect: Any bruising will usually fade within 1 to 2 weeks.  Blood that collects in the tissue (hematoma) may be painful to the touch. It should usually decrease in size and tenderness within 1 to 2 weeks.  SEEK IMMEDIATE MEDICAL CARE IF: You have unusual pain at the radial site.  You have redness, warmth, swelling, or pain at the radial site.  You have drainage (other than a small amount of blood on the dressing).  You have chills.  You have a fever or persistent symptoms for more than 72 hours.  You have a fever and your symptoms suddenly get worse.  Your arm becomes pale, cool, tingly, or numb.  You have heavy bleeding from the site. Hold pressure on the site.   Increase activity slowly    Complete by:  As directed      Discharge Medications   Current Discharge Medication List    START taking these medications   Details  amLODipine (NORVASC) 2.5 MG tablet Take 0.5 tablets (1.25 mg total) by mouth daily. Qty: 30 tablet, Refills: 11    clopidogrel (PLAVIX) 75 MG tablet Take 1 tablet (75 mg total) by mouth daily. Qty: 30 tablet, Refills: 11      CONTINUE these medications which have NOT CHANGED   Details  albuterol (PROVENTIL HFA;VENTOLIN HFA) 108 (90 Base) MCG/ACT inhaler Inhale into the lungs every 6 (six) hours as needed for wheezing or shortness of breath.    aspirin EC 81 MG tablet Take 81 mg by mouth daily.    atorvastatin (LIPITOR) 10 MG tablet Take 1 tablet (10 mg total) by mouth daily. Qty: 30 tablet, Refills: 11    budesonide (PULMICORT) 0.5 MG/2ML nebulizer solution Take 2 mLs (0.5 mg total) by  nebulization 2 (two) times daily. Qty: 120 mL, Refills: 5    doxazosin (CARDURA) 8 MG tablet Take 0.5 tablets (4 mg total) by mouth 2 (two) times daily. Qty: 90 tablet, Refills: 1    fluticasone (FLONASE) 50 MCG/ACT nasal spray Place 2 sprays into both nostrils at bedtime. Qty: 16 g, Refills: 5    loratadine (CLARITIN) 10 MG tablet Take 1 tablet (10 mg total) by mouth daily. Qty: 30 tablet, Refills: 11    mometasone-formoterol (DULERA) 100-5 MCG/ACT AERO Take 2 puffs first thing in am and then another 2 puffs about 12 hours later. Qty: 1 Inhaler, Refills: 11    montelukast (SINGULAIR) 10  MG tablet Take 1 tablet (10 mg total) by mouth at bedtime. Qty: 30 tablet, Refills: 5    omeprazole (PRILOSEC OTC) 20 MG tablet Take 2 tablets (40 mg total) by mouth daily. Qty: 60 tablet, Refills: 5    sucralfate (CARAFATE) 1 g tablet Take 1 tablet (1 g total) by mouth 4 (four) times daily -  with meals and at bedtime. Qty: 120 tablet, Refills: 5           Outstanding Labs/Studies   None  Duration of Discharge Encounter   Greater than 30 minutes including physician time.  Signed, Reino Bellis NP-C 02/06/2016, 10:05 AM

## 2016-02-11 ENCOUNTER — Other Ambulatory Visit: Payer: Self-pay | Admitting: *Deleted

## 2016-02-11 ENCOUNTER — Encounter: Payer: Self-pay | Admitting: *Deleted

## 2016-02-11 ENCOUNTER — Encounter: Payer: Self-pay | Admitting: Cardiovascular Disease

## 2016-02-11 ENCOUNTER — Ambulatory Visit (INDEPENDENT_AMBULATORY_CARE_PROVIDER_SITE_OTHER): Payer: BLUE CROSS/BLUE SHIELD | Admitting: Cardiovascular Disease

## 2016-02-11 VITALS — BP 94/64 | HR 93 | Ht 69.0 in | Wt 195.0 lb

## 2016-02-11 DIAGNOSIS — R079 Chest pain, unspecified: Secondary | ICD-10-CM

## 2016-02-11 DIAGNOSIS — D689 Coagulation defect, unspecified: Secondary | ICD-10-CM

## 2016-02-11 DIAGNOSIS — Z01818 Encounter for other preprocedural examination: Secondary | ICD-10-CM

## 2016-02-11 NOTE — Patient Instructions (Signed)
Medication Instructions:  Your physician recommends that you continue on your current medications as directed. Please refer to the Current Medication list given to you today.   Testing/Procedures: Your physician has requested that you have a RIGHT CORONARY ARTERY STENTING (SCHEDULE FOR RCA PCI). Cardiac catheterization is used to diagnose and/or treat various heart conditions. Doctors may recommend this procedure for a number of different reasons. The most common reason is to evaluate chest pain. Chest pain can be a symptom of coronary artery disease (CAD), and cardiac catheterization can show whether plaque is narrowing or blocking your heart's arteries. This procedure is also used to evaluate the valves, as well as measure the blood flow and oxygen levels in different parts of your heart. For further information please visit [email or web address].   Following your catheterization, you will not be allowed to drive for 3 days.  No lifting, pushing, or pulling greater that 10 pounds is allowed for 1 week.  You will be required to have the following tests prior to the procedure:  1. Blood work-the blood work can be done no more than 14 days prior to the procedure.  It can be done at any [facility] lab.  There is one downstairs on the first floor of this building and one in the [facility] building [phone]).    Puncture site RIGHT GROIN   Dr Gwenlyn Found says you are to remain out of work until the next procedure.   If you need a refill on your cardiac medications before your next appointment, please call your pharmacy.

## 2016-02-11 NOTE — Progress Notes (Signed)
02/11/2016 [name]   [birth year]  [id number]  Primary Physician Walker Kehr, MD Primary Cardiologist: Lorretta Harp MD Renae Gloss  HPI:  [name] a 60 year old single [place] male whose mother is a patient of mine. He has a history of treated hypertension, hyperlipidemia and family history for heart disease. He is complained of chest pain off and on for several years. Somewhat exertional. He did have a stress test done in January that was low risk. He's had a persistent cough over the last 6 months. Recent chest CT performed 01/08/16 showed no significant pulmonary abnormalities but there was a small pericardial effusion and calcium in the LAD territory. He underwent outpatient cardiac catheterization on 02/05/16 B right radial approach revealing a normal left system with moderate hypodense mid dominant RCA stenosis. He developed ventricular fibrillation after the third coronary injection requiring CPR and defibrillation. He continues to have daily chest pain. Blood pressure is too low to allow addition of antianginal medications. He will need RCA PCI and drug-eluting stenting. He is already on antiplatelet therapy.   Current Outpatient Prescriptions  Medication Sig Dispense Refill  . amLODipine (NORVASC) 2.5 MG tablet Take 0.5 tablets (1.25 mg total) by mouth daily. 30 tablet 11  . atorvastatin (LIPITOR) 10 MG tablet Take 1 tablet (10 mg total) by mouth daily. 30 tablet 11  . clopidogrel (PLAVIX) 75 MG tablet Take 1 tablet (75 mg total) by mouth daily. 30 tablet 11  . doxazosin (CARDURA) 8 MG tablet Take 0.5 tablets (4 mg total) by mouth 2 (two) times daily. 90 tablet 1  . fluticasone (FLONASE) 50 MCG/ACT nasal spray Place 2 sprays into both nostrils at bedtime. 16 g 5  . loratadine (CLARITIN) 10 MG tablet Take 1 tablet (10 mg total) by mouth daily. 30 tablet 11  . montelukast (SINGULAIR) 10 MG tablet Take 1 tablet (10 mg total) by mouth at bedtime. 30 tablet  5  . omeprazole (PRILOSEC OTC) 20 MG tablet Take 2 tablets (40 mg total) by mouth daily. 60 tablet 5  . sucralfate (CARAFATE) 1 g tablet Take 1 tablet (1 g total) by mouth 4 (four) times daily -  with meals and at bedtime. 120 tablet 5   No current facility-administered medications for this visit.     Allergies  Allergen Reactions  . Atorvastatin     REACTION: leg cramps per patient with a large dose  . Flomax [Tamsulosin Hcl]     Hard time breathing    Social History   Social History  . Marital status: Divorced    Spouse name: N/A  . Number of children: 1  . Years of education: N/A   Occupational History  . Animal nutritionist    Social History Main Topics  . Smoking status: Never Smoker  . Smokeless tobacco: Never Used  . Alcohol use No     Comment: rare  . Drug use: No  . Sexual activity: No   Other Topics Concern  . Not on file   Social History Narrative   Coffee daily      Review of Systems: General: negative for chills, fever, night sweats or weight changes.  Cardiovascular: negative for chest pain, dyspnea on exertion, edema, orthopnea, palpitations, paroxysmal nocturnal dyspnea or shortness of breath Dermatological: negative for rash Respiratory: negative for cough or wheezing Urologic: negative for hematuria Abdominal: negative for nausea, vomiting, diarrhea, bright red blood per rectum, melena, or hematemesis Neurologic: negative for visual changes, syncope, or dizziness  All other systems reviewed and are otherwise negative except as noted above.    Blood pressure 94/64, pulse 93, height 5\' 9"  (1.753 m), weight 195 lb (88.5 kg).  General appearance: alert and no distress Neck: no adenopathy, no carotid bruit, no JVD, supple, symmetrical, trachea midline and thyroid not enlarged, symmetric, no tenderness/mass/nodules Lungs: clear to auscultation bilaterally Heart: regular rate and rhythm, S1, S2 normal, no murmur, click, rub or gallop Extremities:  extremities normal, atraumatic, no cyanosis or edema  EKG not performed today  ASSESSMENT AND PLAN:   CAD (coronary artery disease) [name] returns today after his recent cardiac catheterization which I performed 02/05/16 for chest pain. I cathed him radially and revealed a 60% hypodense mid dominant RCA stenosis. On the third coronary injection he went into ventricular fibrillation and required CPR and defibrillation. Ultimately we able to restore a heart rate and blood pressure. He spent the night in the intensive care unit. He continues to have daily chest pain. Blood pressure is low precluding admitting beta blockers or long-acting oral nitrates. He will need RCA PCI and stenting via the right femoral approach. I discussed the procedure the patient and his family and he agrees to proceed.The patient understands that risks included but are not limited to stroke (1 in 1000), death (1 in 3), kidney failure [usually temporary] (1 in 500), bleeding (1 in 200), allergic reaction [possibly serious] (1 in 200). The patient understands and agrees to proceed      Lorretta Harp MD [facility], [facility] 02/11/2016 11:52 AM

## 2016-02-11 NOTE — Assessment & Plan Note (Signed)
[name] returns today after his recent cardiac catheterization which I performed 02/05/16 for chest pain. I cathed him radially and revealed a 60% hypodense mid dominant RCA stenosis. On the third coronary injection he went into ventricular fibrillation and required CPR and defibrillation. Ultimately we able to restore a heart rate and blood pressure. He spent the night in the intensive care unit. He continues to have daily chest pain. Blood pressure is low precluding admitting beta blockers or long-acting oral nitrates. He will need RCA PCI and stenting via the right femoral approach. I discussed the procedure the patient and his family and he agrees to proceed.The patient understands that risks included but are not limited to stroke (1 in 1000), death (1 in 14), kidney failure [usually temporary] (1 in 500), bleeding (1 in 200), allergic reaction [possibly serious] (1 in 200). The patient understands and agrees to proceed

## 2016-02-13 ENCOUNTER — Telehealth: Payer: Self-pay | Admitting: Cardiovascular Disease

## 2016-02-13 NOTE — Telephone Encounter (Signed)
New message   Pt mother verbalized that she needs rn to call her to clarify what medications pt is suppose to be taking together

## 2016-02-13 NOTE — Telephone Encounter (Signed)
Attempted to call back. Phone number would not ring and went to busy-type signal.

## 2016-02-16 ENCOUNTER — Ambulatory Visit: Payer: BLUE CROSS/BLUE SHIELD | Admitting: Physician Assistant

## 2016-02-16 ENCOUNTER — Ambulatory Visit: Payer: BLUE CROSS/BLUE SHIELD

## 2016-02-16 NOTE — Telephone Encounter (Signed)
Follow Up Call   Calling about the medications .Please call

## 2016-02-16 NOTE — Telephone Encounter (Signed)
Returned call to patient-made aware that per MD Gwenlyn Found he should be on ASA 81mg  and Plavix 75mg  daily.  Pt verbalized understanding.  Will add ASA to med list.

## 2016-02-16 NOTE — Telephone Encounter (Signed)
Returned patient call- pt wanting to know if he is suppose to take ASA with Plavix.  Both are listed to take at discharge 8/18 on but not listed at [facility] on 8/23 or current medication list.  Will need to clarify with MD Gwenlyn Found.    Routed to MD Gwenlyn Found.

## 2016-02-16 NOTE — Telephone Encounter (Signed)
Yes, he needs to be on ASA 81 mg and plavix 75 mg daily  JJB

## 2016-02-18 ENCOUNTER — Ambulatory Visit (INDEPENDENT_AMBULATORY_CARE_PROVIDER_SITE_OTHER): Payer: BLUE CROSS/BLUE SHIELD | Admitting: Pulmonary Disease

## 2016-02-18 ENCOUNTER — Encounter: Payer: Self-pay | Admitting: Pulmonary Disease

## 2016-02-18 ENCOUNTER — Telehealth: Payer: Self-pay | Admitting: Cardiovascular Disease

## 2016-02-18 VITALS — BP 118/72 | HR 82

## 2016-02-18 DIAGNOSIS — R0609 Other forms of dyspnea: Secondary | ICD-10-CM | POA: Diagnosis not present

## 2016-02-18 DIAGNOSIS — K219 Gastro-esophageal reflux disease without esophagitis: Secondary | ICD-10-CM | POA: Diagnosis not present

## 2016-02-18 DIAGNOSIS — R05 Cough: Secondary | ICD-10-CM

## 2016-02-18 DIAGNOSIS — R0600 Dyspnea, unspecified: Secondary | ICD-10-CM

## 2016-02-18 DIAGNOSIS — R059 Cough, unspecified: Secondary | ICD-10-CM

## 2016-02-18 MED ORDER — PREDNISONE 10 MG PO TABS: 30.0000 mg | ORAL_TABLET | Freq: Every day | ORAL | 0 refills | Status: DC

## 2016-02-18 NOTE — Patient Instructions (Signed)
It was a pleasure taking care of you today!  Your cough is most likely multifactorial. Likely related to the following: GERD, UACS Start prednisone, 30 mg a day for 1 week.  Discontinue sucralfate after 1 week. Discontinue Flonase 3 days after stopping sucralfate.  We will schedule you for a breathing test. Continue other medicines as  discussed.  Return to clinic in 6 weeks.

## 2016-02-18 NOTE — Telephone Encounter (Signed)
His BP runs consistently low. I would put dental issues on t he back burner until we deal with his heart issues.  JJB

## 2016-02-18 NOTE — Telephone Encounter (Signed)
Patient dropped off FMLA Forms on 02/17/16 for Dr Gwenlyn Found to review, complete and sign.  Forms given to [employer]..  Forms were given to me on 02/18/16.  No AUTH/Pmt to [employer] received.  Sent forms to [facility] @ [employer] for letter to be sent to patient for AUTH/PMT.  Sent via [place] on 02/18/16. lp

## 2016-02-18 NOTE — Telephone Encounter (Signed)
No answer. Left message to call back.   

## 2016-02-18 NOTE — Progress Notes (Addendum)
Subjective:    Patient ID: [name], male    DOB: 1956-06-15, 60 y.o.   MRN: [id number]  HPI   This is the case of [name], 60 y.o. Male, who was referred by Dr. Walker Kehr in consultation regarding cough.   As you very well know, patient is a non smoker. Not been diagnosed with asthma or copd.  Pt is originally from [place], has been in [place] almost 18 yrs.  No h/o allergies or sinus issues or chronic cough until start of the year.   Pt has been coughing since 06/2015 -- not sure what triggers are. He coughs on and off, work and at home. Not necessarily worse at sleep. Dry cough, some mucus sometimes. Cough better usually by itself.  Same cough severity and frequency x 6 mos.    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 He works 2-3 x/week. Does night shift from 11pm-8 am. His cough is mildly worse at work.   Cough did NOT start  after an infection. Was Rx abx but he did NOT take it. Took pred for a week and was not better.   Pt with exertional dyspnea at work x 6-7 mos.   Stess test in 06/2015 was N. Pt does not want LHC despite being advised this.   Has GERD. Not on meds.    Has lived at current house x 1yrs. (-) pets. (-) carpets, molds, stagnant water. (-) hot tub use.  (-) Recent ravel. No new meds.   ROV (12/31/15) Pt is here for f/u on cough. During this encounter, there was an interpreter who spoke to the pt in [place] to facilitate better communication. Has seen Noe Gens on 12/08/15 and Dr. Melvyn Novas 12/16/15 since last seen by me.  Meds were changed. Symbicort to [facility] and Dr. Melvyn Novas stopped ARB and gave him bystolic. Since last seen, he states his cough is worse, despite med changes.  He took his meds as prescribed. Coughs all day and night. He has been at [employer] since 08/2013 and he has been buffing the floors since that time. Cough started in 06/2015. He does shift work from 11pm-8am,  He  has 4 nights on and 3 nights off. He bellieves his cough is related to exposure to propane. He states on Jan 15 or 16th 2017, he started vomiting at work and he started coughing bad and got "really very sick". Since that time, he has been coughing daily.  Prior to that, he was just coughing during work, and gets better during days off.  He felt sick that night -- throwing up, fevers, chills, SOB BUT he did not go to ER.  Saw PCP 2-3 days after the incident > work up was done. Sx got better except for cough.   Dr. Melvyn Novas gave him dulera > no effect. Symbicort I gave did not help.  Takes prilosec in am and zantac at night > no effect.  Cozaar was dc'd by Dr. Melvyn Novas and switched to bystolic.  Bystolic made him fatigued and weak. He stopped bystolic x 2 days.  PFT could not be done 2/2 intractable coughing. Not a good test.  Has not tried flonase. Takes Claritin daily.  Tried Prednisone before > not sure if it helped him.  He has not been admitted nor has been on abx since last seen by me.  Currently working still at [employer] but has not been buffing floors since June. Cough is not better.                                                                    ROV 01/23/16   Pt is in the office as f/u on his cough. The interview was with a [place] interpreter who also was with [place] last time.  Has never been dxed with asthma or copd or any lung problems.    He states he is the same as last month. He still coughs everyday. He coughs in his sleep. (-) fevers, chills.On and off cp x 8 months.    CP is exertional, more  Frequent, happens 2-3 times/day but not daily, lasts for 2-3 minutes, usually followed by cough.  There have been times wherein he just had cp with no cough or assoc sx. Had PFT last month which showed mild COPD BUT PFT technique was not good as he was coughing. Nebs made him  cough more. Restarted dulera > not making a difference. Still takes prilosec BID.     Takes flonase and singulair.   Not on pred. Has not taken time off.   Pt is scheduled to have [place] on 8/17. He has not been admitted nor has been on abx since last seen.      ROV 02/18/2016    Since last seen, he had elective cath on 8/17 and he was supposed to have stents to RCA but he went into VF requiring defibrillation. Stent was not done and is rescheduled for end of Oct.  Still with the same cough. Dry cough. Still SOB. Occasional wheezing with cough. Takes Pirolosec BID and Sucralfate QID. Takes flonase, zyrtec, singulair. This time, interpreter  Is via videophone [place].   I have noticed since the first time I saw him, his cough has gotten less (at least during the encounter) but pt denies.  Review of Systems  Constitutional: Negative.  Negative for fever and unexpected weight change.  HENT: Negative for congestion, dental problem, ear pain, nosebleeds, postnasal drip, rhinorrhea, sinus pressure, sneezing, sore throat and trouble swallowing.   Eyes: Negative.  Negative for redness and itching.  Respiratory: Positive for cough, chest tightness and shortness of breath. Negative for  wheezing.   Cardiovascular: Negative.  Negative for palpitations and leg swelling.  Gastrointestinal: Negative.  Negative for nausea and vomiting.  Endocrine: Negative.   Genitourinary: Negative.  Negative for dysuria.  Musculoskeletal: Positive for arthralgias. Negative for joint swelling.  Skin: Negative.  Negative for rash.  Allergic/Immunologic: Negative.   Neurological: Positive for headaches.  Hematological: Negative.  Does not bruise/bleed easily.  Psychiatric/Behavioral: Negative.  Negative for dysphoric mood. The patient is not nervous/anxious.       Objective:   Physical Exam   Vitals:  Vitals:   02/18/16 1610  BP: 118/72  Pulse: 82  SpO2: 97%    Constitutional/General:  Pleasant, well-nourished, well-developed, not in any distress,  Comfortably seating.  Well kempt. Coughs every now and then but less than before.   There is no height or weight on file to calculate BMI. Wt Readings from Last 3 Encounters:  02/11/16 195 lb (88.5 kg)  02/05/16 193 lb 12.6 oz (87.9 kg)  01/23/16 197 lb (89.4 kg)    HEENT: Pupils equal and reactive to light and accommodation. Anicteric sclerae. Normal nasal mucosa.   No oral  lesions,  mouth clear,  oropharynx clear, no postnasal drip. (-) Oral thrush. No dental caries.  Airway - Mallampati class III  Neck: No masses. Midline trachea. No JVD, (-) LAD. (-) bruits appreciated.  Respiratory/Chest: Grossly normal chest. (-) deformity. (-) Accessory muscle use.  Symmetric expansion. (-) Tenderness on palpation.  Resonant on percussion.  Diminished BS on both lower lung zones. (-) crackles, rhonchi. Occasional wheezing.  (-) egophony  Cardiovascular: Regular rate and  rhythm, heart sounds normal, no murmur or gallops, no peripheral edema  Gastrointestinal:  Normal bowel sounds. Soft, non-tender. No hepatosplenomegaly.  (-) masses.   Musculoskeletal:  Normal muscle tone. Normal gait.   Extremities: Grossly normal. (-) clubbing,  cyanosis.  (-) edema  Skin: (-) rash,lesions seen.   Neurological/Psychiatric : alert, oriented to time, place, person. Normal mood and affect           Assessment & Plan:  Cough Cough since 06/2015. Complicated history.  See history.  Bottom line, pt is implicating exposure to propane at work as the cause of his cough. He mentioned about workmans compensation during 12/2015 visit. No mention after that.   Chest CT scan (12/2015)  Mild scarring in RLL and lingula. (-) COPD/infiltrate PFT (12/2015)  Mild COPD BUT poor technique 2/2 coughing. FEV1  3.45  97% Blood test ([facility], 12/2015) (-). IgE was 49.  Abd [place] (01/2016)  Hepatic steatosis  Differentials: 1. Pt implicates exposure to propane while at work as cause of his cough.  He has had chronic cough during work, better with days off. On jan 15th or 16th of 2017, he felt "really very sick" at work with fevers, chills, nausea, vomiting, severe cough.  Has been coughing daily since that time.  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 Patient denies any history of asthma  or COPD or any lung problem preceding the cough.  2. UACS. Less likely at this point. Not better with flonase, singulair, zyrtec. 3. GERD. This is my main differential for  pt's cough. I think patient has underlying reflux disease causing him to have reflux cough. 4. Hypersensitivity reaction/asthma type reaction. Less likely at this point. No response to MDI (Dulera, Symbicort, Neb meds) 5. Pt has CAD. S/P LHC 8/17 and he was supposed to have an RCA stent but went into VF.  Doubt this is the main reason for cough but certainly, can contribute to it.    Plan : 1.Trial with prednisone, 30 mg/d x 1 week.  He might have done better slightly with prednisone before. If not better after 1 week, d/c prednisone.  2..Continue Prilosec twice a day. 3. Stop sucralfate after 1 week on prednisone ( 1 want to do 1 med change at a time) 4. Rx for UACS. Cont  singulair, claritin. Stop flonase 1 week after stopping sucralfate. 5.  Pt was asking whether he has COPD or not again.  I told him his PFT was suboptimal.  He told me he was not instructed well.  Plan on doing PFT again with an interpreter after his stent.  6. Pt to have RCA stent end of Oct.  7. Pt will need a EGD as part of work up after RCA stent.  8. May also need bronchoscopy later on.  9. Pt was asked not to work until after the stent.  He has been off work since [employer].  Observe how the cough is while off work.  If significantly better, then it might be related to work. (I wanted him to take off before but he did not want to)   GERD Cont PPI BID. Wean off sucralfate. Will need EGD after stent.   Return to clinic after his stent and PFT.    I spent at least 25 minutes with the patient today and more than 50% was spent counseling the patient regarding disease and management and facilitating labs and medications.             Monica Becton, MD 02/19/2016   6:16 AM [facility] [place] Pager: [phone] Office: [phone], Fax: [phone]

## 2016-02-18 NOTE — Telephone Encounter (Signed)
Received incoming call from patient. Patient asked if he could have his teeth cleaned and cavities done on September 5th and 12th. Patient is on Plavix and Aspirin.  Also, patient stated his blood pressure has been running low. Readings being 94/53, HR 89 and 88/53, HR 86. Patient states he feels weak, wants to sleep and has chest pain at times. He denies dizziness and SOB.  Will route to Dr Gwenlyn Found for advice concerning dental work and BP.

## 2016-02-18 NOTE — Telephone Encounter (Signed)
New Message  Pt wife call requesting to speak with RN about a pt catheterization on 9/28. Please call back to discuss. I had some difficulties understanding. Please call back to dicsuss

## 2016-02-19 ENCOUNTER — Telehealth: Payer: Self-pay | Admitting: Cardiovascular Disease

## 2016-02-19 ENCOUNTER — Ambulatory Visit: Payer: BLUE CROSS/BLUE SHIELD | Admitting: Pulmonary Disease

## 2016-02-19 NOTE — Assessment & Plan Note (Signed)
Cont PPI BID. Wean off sucralfate. Will need EGD after stent.

## 2016-02-19 NOTE — Telephone Encounter (Signed)
Follow up   Pt calling about sept 28th, he wants to know can he go to the dentist for cleaning and cavities

## 2016-02-19 NOTE — Telephone Encounter (Signed)
Returned patient call-no answer, left message (ok per DPR) to advise of MD Gwenlyn Found recommendations:  Lorretta Harp, MD  Note    His BP runs consistently low. I would put dental issues on t he back burner until we deal with his heart issues.  JJB     Advised to call with further questions/concerns.

## 2016-02-19 NOTE — Telephone Encounter (Signed)
Received FMLA [employer]) Forms back from [place] @ [facility] for Dr Gwenlyn Found to review, complete and sign.  Forms given to [facility] RN for Dr Gwenlyn Found to sign.lp

## 2016-02-19 NOTE — Assessment & Plan Note (Addendum)
Cough since 06/2015. Complicated history.  See history.  Bottom line, pt is implicating exposure to propane at work as the cause of his cough. He mentioned about workmans compensation during 12/2015 visit. No mention after that.   Chest CT scan (12/2015)  Mild scarring in RLL and lingula. (-) COPD/infiltrate PFT (12/2015)  Mild COPD BUT poor technique 2/2 coughing. FEV1  3.45  97% Blood test ([facility], 12/2015) (-). IgE was 49.  Abd [place] (01/2016)  Hepatic steatosis  Differentials: 1. Pt implicates exposure to propane while at work as cause of his cough.  He has had chronic cough during work, better with days off. On jan 15th or 16th of 2017, he felt "really very sick" at work with fevers, chills, nausea, vomiting, severe cough.  Has been coughing daily since that time.  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 Patient denies any history of asthma  or COPD or any lung problem preceding the cough.  2. UACS. Less likely at this point. Not better with flonase, singulair, zyrtec. 3. GERD. This is my main differential for pt's cough. I think patient has underlying reflux disease causing him to have reflux cough. 4. Hypersensitivity reaction/asthma type reaction. Less likely at this point. No response to MDI (Dulera, Symbicort, Neb meds) 5. Pt has CAD. S/P LHC 8/17 and he was supposed to have an RCA stent but went into VF.  Doubt this is the main reason for cough but certainly, can contribute to it.    Plan : 1.Trial with prednisone, 30 mg/d x 1 week.  He might have done better slightly with prednisone before. If not better after 1 week, d/c prednisone.  2..Continue Prilosec twice a day. 3. Stop sucralfate after 1 week on prednisone ( 1 want to do 1 med change at a time) 4. Rx for UACS. Cont singulair, claritin. Stop flonase 1 week after stopping sucralfate. 5.  Pt was asking whether he has COPD or not again.  I told him his PFT was suboptimal.  He told me he was not instructed well.  Plan  on doing PFT again with an interpreter after his stent.  6. Pt to have RCA stent end of Oct.  7. Pt will need a EGD as part of work up after RCA stent.  8. May also need bronchoscopy later on.  9. Pt was asked not to work until after the stent.  He has been off work since [employer].  Observe how the cough is while off work.  If significantly better, then it might be related to work. (I wanted him to take off before but he did not want to)

## 2016-02-20 ENCOUNTER — Telehealth: Payer: Self-pay | Admitting: Cardiovascular Disease

## 2016-02-20 ENCOUNTER — Telehealth: Payer: Self-pay | Admitting: *Deleted

## 2016-02-20 NOTE — Telephone Encounter (Signed)
New message       Calling to see if he can go to the dentist for "cleaning" and "cavities" prior to his cath in sept.  I tried to explain to pt that the dentist needed to call and get clearance.  He said "that he calls about his condition".

## 2016-02-20 NOTE — Telephone Encounter (Signed)
Called patient back and his mother answered.  Gave her the information as previously stated by Dr Gwenlyn Found: Lorretta Harp, MD  Note    His BP runs consistently low. I would put dental issues on t he back burner until we deal with his heart issues.  JJB     She verbalized understanding and will tell her son.

## 2016-02-20 NOTE — Telephone Encounter (Signed)
Called patient to ask him about his job description for his job at [employer]. Need this information to fill out medical form for his job as his employer did not provide it. 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 States it is a very physical job.  Will use this information to complete medication paperwork with Dr Gwenlyn Found the next time he is in the office.

## 2016-02-24 ENCOUNTER — Telehealth: Payer: Self-pay | Admitting: Cardiovascular Disease

## 2016-02-24 NOTE — Telephone Encounter (Signed)
Received signed FMLA Forms Bebe Liter) back from Dr Gwenlyn Found.  Notified

## 2016-02-28 ENCOUNTER — Other Ambulatory Visit: Payer: Self-pay | Admitting: Internal Medicine

## 2016-03-03 ENCOUNTER — Telehealth: Payer: Self-pay | Admitting: Cardiovascular Disease

## 2016-03-03 NOTE — Telephone Encounter (Addendum)
Returned call. Patient feeling unwell, had me speak to mother. She notes patient had normal BP at rest this AM, then 94/60 w pulse 86 when standing. She checked it again and it was 83/53 w HR 96 after ambulating. He was also experiencing some dizziness. She asks if he will feel better "if I give him a piece of chocolate". I asked about food, fluid intake this AM. States pt has not had anything to eat yet. Advised to have him drink water and juice this morning, eat something to see if he feels better. If no improvement or if he is nauseous and cannot keep fluids down, he should go to urgent care or ER. Did not advise on making changes to meds yet - he has already taken his scheduled meds today including amlodipine, for which he takes a 1.25mg  dose.  She will have pt's son call back later to communicate any other requests.

## 2016-03-03 NOTE — Telephone Encounter (Signed)
New message    Pt c/o BP issue: STAT if pt c/o blurred vision, one-sided weakness or slurred speech  1. What are your last 5 BP readings? 83/53/96 out of bed, 123/78 not really sure about the readings b/c pt wife was hard to understand  2. Are you having any other symptoms (ex. Dizziness, headache, blurred vision, passed out)? Dizziness  3. What is your BP issue? Dizziness, no feeling good and in bed

## 2016-03-05 NOTE — Telephone Encounter (Signed)
Pt reports feeling better. Had noted 114/86 bp 87 hr taken today. He has had some intermittent chest tightness, "very small" as he states. No shortness of breath or other accompanying symptoms. Reports he has had this, same issue he has seen Dr. Gwenlyn Found for, and he has an upcoming cath w/ PCI. Pt aware of instructions to go to ER if worsening symptoms.

## 2016-03-10 LAB — CBC WITH DIFFERENTIAL/PLATELET
BASOS PCT: 0 %
Basophils Absolute: 0 cells/uL (ref 0–200)
EOS PCT: 2 %
Eosinophils Absolute: 154 cells/uL (ref 15–500)
HCT: 39.6 % (ref 38.5–50.0)
Hemoglobin: 13.3 g/dL (ref 13.2–17.1)
LYMPHS ABS: 1540 {cells}/uL (ref 850–3900)
Lymphocytes Relative: 20 %
MCH: 27.8 pg (ref 27.0–33.0)
MCHC: 33.6 g/dL (ref 32.0–36.0)
MCV: 82.7 fL (ref 80.0–100.0)
MONOS PCT: 10 %
MPV: 9.1 fL (ref 7.5–12.5)
Monocytes Absolute: 770 cells/uL (ref 200–950)
NEUTROS ABS: 5236 {cells}/uL (ref 1500–7800)
Neutrophils Relative %: 68 %
PLATELETS: 248 10*3/uL (ref 140–400)
RBC: 4.79 MIL/uL (ref 4.20–5.80)
RDW: 14.5 % (ref 11.0–15.0)
WBC: 7.7 10*3/uL (ref 3.8–10.8)

## 2016-03-10 LAB — APTT: APTT: 35 s — AB (ref 22–34)

## 2016-03-10 LAB — PROTIME-INR
INR: 1
PROTHROMBIN TIME: 10.5 s (ref 9.0–11.5)

## 2016-03-11 ENCOUNTER — Telehealth: Payer: Self-pay | Admitting: Pulmonary Disease

## 2016-03-11 LAB — BASIC METABOLIC PANEL
BUN: 14 mg/dL (ref 7–25)
CALCIUM: 9.1 mg/dL (ref 8.6–10.3)
CO2: 26 mmol/L (ref 20–31)
CREATININE: 0.96 mg/dL (ref 0.70–1.33)
Chloride: 104 mmol/L (ref 98–110)
Glucose, Bld: 106 mg/dL — ABNORMAL HIGH (ref 65–99)
Potassium: 4.5 mmol/L (ref 3.5–5.3)
SODIUM: 138 mmol/L (ref 135–146)

## 2016-03-11 NOTE — Telephone Encounter (Signed)
Called spoke with pt. He reports after he took the prednisone, his cough is completely gone now. He is feeling better. FYI

## 2016-03-17 ENCOUNTER — Telehealth: Payer: Self-pay | Admitting: Cardiovascular Disease

## 2016-03-17 NOTE — Telephone Encounter (Signed)
No contraindicated meds on patient med list. Advised patient on taking scheduled meds tomorrow am w small sip of water and to report to short stay tomorrow @9am . Pt verbalized understanding of instructions and thanks for call.

## 2016-03-17 NOTE — Telephone Encounter (Signed)
New message       Pt is scheduled to have a cath tomorrow am.  Calling to see which medications he can take in the morning prior to procedure

## 2016-03-18 ENCOUNTER — Ambulatory Visit (HOSPITAL_COMMUNITY)
Admission: RE | Admit: 2016-03-18 | Discharge: 2016-03-19 | Disposition: A | Discharge status: Home / Self Care | Payer: BLUE CROSS/BLUE SHIELD | Source: Ambulatory Visit | Attending: Cardiovascular Disease | Admitting: Cardiovascular Disease

## 2016-03-18 ENCOUNTER — Encounter (HOSPITAL_COMMUNITY): Payer: Self-pay | Admitting: *Deleted

## 2016-03-18 ENCOUNTER — Other Ambulatory Visit: Payer: Self-pay

## 2016-03-18 ENCOUNTER — Encounter (HOSPITAL_COMMUNITY)
Admission: RE | Disposition: A | Discharge status: Home / Self Care | Payer: Self-pay | Source: Ambulatory Visit | Attending: Cardiovascular Disease

## 2016-03-18 DIAGNOSIS — R079 Chest pain, unspecified: Secondary | ICD-10-CM

## 2016-03-18 DIAGNOSIS — E785 Hyperlipidemia, unspecified: Secondary | ICD-10-CM | POA: Diagnosis not present

## 2016-03-18 DIAGNOSIS — R05 Cough: Secondary | ICD-10-CM | POA: Diagnosis not present

## 2016-03-18 DIAGNOSIS — Z7982 Long term (current) use of aspirin: Secondary | ICD-10-CM | POA: Insufficient documentation

## 2016-03-18 DIAGNOSIS — Z79899 Other long term (current) drug therapy: Secondary | ICD-10-CM | POA: Insufficient documentation

## 2016-03-18 DIAGNOSIS — I10 Essential (primary) hypertension: Secondary | ICD-10-CM | POA: Diagnosis not present

## 2016-03-18 DIAGNOSIS — Z7902 Long term (current) use of antithrombotics/antiplatelets: Secondary | ICD-10-CM | POA: Diagnosis not present

## 2016-03-18 DIAGNOSIS — K219 Gastro-esophageal reflux disease without esophagitis: Secondary | ICD-10-CM | POA: Diagnosis not present

## 2016-03-18 DIAGNOSIS — I2510 Atherosclerotic heart disease of native coronary artery without angina pectoris: Secondary | ICD-10-CM | POA: Diagnosis not present

## 2016-03-18 DIAGNOSIS — Z955 Presence of coronary angioplasty implant and graft: Secondary | ICD-10-CM

## 2016-03-18 DIAGNOSIS — Z7951 Long term (current) use of inhaled steroids: Secondary | ICD-10-CM | POA: Insufficient documentation

## 2016-03-18 DIAGNOSIS — Z8249 Family history of ischemic heart disease and other diseases of the circulatory system: Secondary | ICD-10-CM | POA: Insufficient documentation

## 2016-03-18 DIAGNOSIS — Z9861 Coronary angioplasty status: Secondary | ICD-10-CM

## 2016-03-18 HISTORY — DX: Calculus of kidney: N20.0

## 2016-03-18 HISTORY — DX: Atherosclerotic heart disease of native coronary artery without angina pectoris: I25.10

## 2016-03-18 HISTORY — PX: CARDIAC CATHETERIZATION: SHX172

## 2016-03-18 HISTORY — DX: Unspecified atrial fibrillation: I48.91

## 2016-03-18 LAB — POCT ACTIVATED CLOTTING TIME: Activated Clotting Time: 340 seconds

## 2016-03-18 SURGERY — CORONARY STENT INTERVENTION
Anesthesia: LOCAL

## 2016-03-18 MED ORDER — SODIUM CHLORIDE 0.9% FLUSH: 3.0000 mL | INTRAVENOUS | Status: DC | PRN

## 2016-03-18 MED ORDER — SODIUM CHLORIDE 0.9% FLUSH
3.0000 mL | Freq: Two times a day (BID) | INTRAVENOUS | Status: DC
  Administered 2016-03-18: 21:00:00 3 mL via INTRAVENOUS

## 2016-03-18 MED ORDER — MONTELUKAST SODIUM 10 MG PO TABS
10.0000 mg | ORAL_TABLET | Freq: Every day | ORAL | Status: DC
  Administered 2016-03-18: 10 mg via ORAL
  Filled 2016-03-18: qty 1

## 2016-03-18 MED ORDER — ASPIRIN 81 MG PO CHEW: 81.0000 mg | CHEWABLE_TABLET | Freq: Every day | ORAL | Status: DC

## 2016-03-18 MED ORDER — HEPARIN (PORCINE) IN NACL 2-0.9 UNIT/ML-% IJ SOLN
INTRAMUSCULAR | Status: AC
  Filled 2016-03-18: qty 1000

## 2016-03-18 MED ORDER — ASPIRIN EC 81 MG PO TBEC
81.0000 mg | DELAYED_RELEASE_TABLET | Freq: Every day | ORAL | Status: DC
  Administered 2016-03-18 – 2016-03-19 (×2): 81 mg via ORAL
  Filled 2016-03-18 (×2): qty 1

## 2016-03-18 MED ORDER — SUCRALFATE 1 G PO TABS
1.0000 g | ORAL_TABLET | Freq: Three times a day (TID) | ORAL | Status: DC
  Administered 2016-03-18: 17:00:00 1 g via ORAL
  Filled 2016-03-18 (×4): qty 1

## 2016-03-18 MED ORDER — ATORVASTATIN CALCIUM 10 MG PO TABS
10.0000 mg | ORAL_TABLET | Freq: Every day | ORAL | Status: DC
  Filled 2016-03-18 (×2): qty 1

## 2016-03-18 MED ORDER — FENTANYL CITRATE (PF) 100 MCG/2ML IJ SOLN
INTRAMUSCULAR | Status: DC | PRN
  Administered 2016-03-18: 25 ug via INTRAVENOUS

## 2016-03-18 MED ORDER — MIDAZOLAM HCL 2 MG/2ML IJ SOLN
INTRAMUSCULAR | Status: DC | PRN
  Administered 2016-03-18: 1 mg via INTRAVENOUS

## 2016-03-18 MED ORDER — LORATADINE 10 MG PO TABS
10.0000 mg | ORAL_TABLET | Freq: Every day | ORAL | Status: DC
  Administered 2016-03-18 – 2016-03-19 (×2): 10 mg via ORAL
  Filled 2016-03-18 (×2): qty 1

## 2016-03-18 MED ORDER — ASPIRIN 81 MG PO CHEW: 81.0000 mg | CHEWABLE_TABLET | ORAL | Status: DC

## 2016-03-18 MED ORDER — FLUTICASONE PROPIONATE 50 MCG/ACT NA SUSP
2.0000 | Freq: Every day | NASAL | Status: DC
  Filled 2016-03-18: qty 16

## 2016-03-18 MED ORDER — IOPAMIDOL (ISOVUE-370) INJECTION 76%
INTRAVENOUS | Status: AC
  Filled 2016-03-18: qty 100

## 2016-03-18 MED ORDER — HYDRALAZINE HCL 20 MG/ML IJ SOLN: 10.0000 mg | INTRAMUSCULAR | Status: DC | PRN

## 2016-03-18 MED ORDER — BIVALIRUDIN 250 MG IV SOLR
INTRAVENOUS | Status: AC
  Filled 2016-03-18: qty 250

## 2016-03-18 MED ORDER — IOPAMIDOL (ISOVUE-370) INJECTION 76%
INTRAVENOUS | Status: DC | PRN
  Administered 2016-03-18: 45 mL via INTRA_ARTERIAL

## 2016-03-18 MED ORDER — SODIUM CHLORIDE 0.9 % WEIGHT BASED INFUSION
3.0000 mL/kg/h | INTRAVENOUS | Status: DC
  Administered 2016-03-18: 3 mL/kg/h via INTRAVENOUS

## 2016-03-18 MED ORDER — ACETAMINOPHEN 325 MG PO TABS
650.0000 mg | ORAL_TABLET | ORAL | Status: DC | PRN
  Administered 2016-03-19: 10:00:00 650 mg via ORAL
  Filled 2016-03-18: qty 2

## 2016-03-18 MED ORDER — ONDANSETRON HCL 4 MG/2ML IJ SOLN: 4.0000 mg | Freq: Four times a day (QID) | INTRAMUSCULAR | Status: DC | PRN

## 2016-03-18 MED ORDER — SODIUM CHLORIDE 0.9 % IV SOLN: 250.0000 mL | INTRAVENOUS | Status: DC | PRN

## 2016-03-18 MED ORDER — CLOPIDOGREL BISULFATE 75 MG PO TABS: 75.0000 mg | ORAL_TABLET | Freq: Every day | ORAL | Status: DC

## 2016-03-18 MED ORDER — NITROGLYCERIN 1 MG/10 ML FOR IR/CATH LAB
INTRA_ARTERIAL | Status: AC
  Filled 2016-03-18: qty 10

## 2016-03-18 MED ORDER — LIDOCAINE HCL (PF) 1 % IJ SOLN
INTRAMUSCULAR | Status: AC
  Filled 2016-03-18: qty 30

## 2016-03-18 MED ORDER — HEPARIN (PORCINE) IN NACL 2-0.9 UNIT/ML-% IJ SOLN
INTRAMUSCULAR | Status: DC | PRN
  Administered 2016-03-18: 1000 mL

## 2016-03-18 MED ORDER — SODIUM CHLORIDE 0.9 % IV SOLN
INTRAVENOUS | Status: AC
  Administered 2016-03-18: 13:00:00 via INTRAVENOUS

## 2016-03-18 MED ORDER — BIVALIRUDIN BOLUS VIA INFUSION - CUPID
INTRAVENOUS | Status: DC | PRN
  Administered 2016-03-18: 66.375 mg via INTRAVENOUS

## 2016-03-18 MED ORDER — DOXAZOSIN MESYLATE 4 MG PO TABS
4.0000 mg | ORAL_TABLET | Freq: Two times a day (BID) | ORAL | Status: DC
  Administered 2016-03-18 – 2016-03-19 (×2): 4 mg via ORAL
  Filled 2016-03-18 (×3): qty 1

## 2016-03-18 MED ORDER — FENTANYL CITRATE (PF) 100 MCG/2ML IJ SOLN
INTRAMUSCULAR | Status: AC
  Filled 2016-03-18: qty 2

## 2016-03-18 MED ORDER — SODIUM CHLORIDE 0.9 % WEIGHT BASED INFUSION: 1.0000 mL/kg/h | INTRAVENOUS | Status: DC

## 2016-03-18 MED ORDER — SODIUM CHLORIDE 0.9 % IV SOLN
INTRAVENOUS | Status: DC | PRN
  Administered 2016-03-18: 1.75 mg/kg/h via INTRAVENOUS

## 2016-03-18 MED ORDER — MIDAZOLAM HCL 2 MG/2ML IJ SOLN
INTRAMUSCULAR | Status: AC
  Filled 2016-03-18: qty 2

## 2016-03-18 MED ORDER — LIDOCAINE HCL (PF) 1 % IJ SOLN
INTRAMUSCULAR | Status: DC | PRN
  Administered 2016-03-18: 22 mL

## 2016-03-18 MED ORDER — OMEPRAZOLE 20 MG PO CPDR
40.0000 mg | DELAYED_RELEASE_CAPSULE | Freq: Every day | ORAL | Status: DC
  Administered 2016-03-18: 40 mg via ORAL
  Filled 2016-03-18 (×2): qty 2

## 2016-03-18 MED ORDER — AMLODIPINE BESYLATE 2.5 MG PO TABS
1.2500 mg | ORAL_TABLET | Freq: Every day | ORAL | Status: DC
  Administered 2016-03-18 – 2016-03-19 (×2): 1.25 mg via ORAL
  Filled 2016-03-18 (×2): qty 0.5

## 2016-03-18 MED ORDER — CLOPIDOGREL BISULFATE 75 MG PO TABS
75.0000 mg | ORAL_TABLET | Freq: Every day | ORAL | Status: DC
  Administered 2016-03-19: 09:00:00 75 mg via ORAL
  Filled 2016-03-18: qty 1

## 2016-03-18 SURGICAL SUPPLY — 14 items
BALLN EMERGE MR 2.0X12 (BALLOONS) ×2
BALLN ~~LOC~~ EUPHORA RX 3.25X12 (BALLOONS) ×2
BALLOON EMERGE MR 2.0X12 (BALLOONS) IMPLANT
BALLOON ~~LOC~~ EUPHORA RX 3.25X12 (BALLOONS) IMPLANT
CATH VISTA GUIDE 6FR JR4 SH (CATHETERS) ×1 IMPLANT
KIT ENCORE 26 ADVANTAGE (KITS) ×3 IMPLANT
KIT HEART LEFT (KITS) ×2 IMPLANT
PACK CARDIAC CATHETERIZATION (CUSTOM PROCEDURE TRAY) ×2 IMPLANT
SHEATH PINNACLE 6F 10CM (SHEATH) ×1 IMPLANT
STENT XIENCE ALPINE RX 3.0X15 (Permanent Stent) ×1 IMPLANT
TRANSDUCER W/STOPCOCK (MISCELLANEOUS) ×2 IMPLANT
TUBING CIL FLEX 10 FLL-RA (TUBING) ×2 IMPLANT
WIRE ASAHI PROWATER 180CM (WIRE) ×1 IMPLANT
WIRE EMERALD 3MM-J .035X150CM (WIRE) ×1 IMPLANT

## 2016-03-18 NOTE — H&P (View-Only) (Signed)
Subjective:    Patient ID: [name], male    DOB: 05-09-1956, 60 y.o.   MRN: [id number]  HPI   This is the case of [name], 60 y.o. Male, who was referred by Dr. Walker Kehr in consultation regarding cough.   As you very well know, patient is a non smoker. Not been diagnosed with asthma or copd.  Pt is originally from [place], has been in [place] almost 18 yrs.  No h/o allergies or sinus issues or chronic cough until start of the year.   Pt has been coughing since 06/2015 -- not sure what triggers are. He coughs on and off, work and at home. Not necessarily worse at sleep. Dry cough, some mucus sometimes. Cough better usually by itself.  Same cough severity and frequency x 6 mos.    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 He works 2-3 x/week. Does night shift from 11pm-8 am. His cough is mildly worse at work.   Cough did NOT start  after an infection. Was Rx abx but he did NOT take it. Took pred for a week and was not better.   Pt with exertional dyspnea at work x 6-7 mos.   Stess test in 06/2015 was N. Pt does not want LHC despite being advised this.   Has GERD. Not on meds.    Has lived at current house x 77yrs. (-) pets. (-) carpets, molds, stagnant water. (-) hot tub use.  (-) Recent ravel. No new meds.   ROV (12/31/15) Pt is here for f/u on cough. During this encounter, there was an interpreter who spoke to the pt in [place] to facilitate better communication. Has seen Noe Gens on 12/08/15 and Dr. Melvyn Novas 12/16/15 since last seen by me.  Meds were changed. Symbicort to [facility] and Dr. Melvyn Novas stopped ARB and gave him bystolic. Since last seen, he states his cough is worse, despite med changes.  He took his meds as prescribed. Coughs all day and night. He has been at [facility] since 08/2013 and he has been buffing the floors since that time. Cough started in 06/2015. He does shift work from 11pm-8am,  He  has 4 nights on and 3 nights off. He bellieves his cough is related to exposure to propane. He states on Jan 15 or 16th 2017, he started vomiting at work and he started coughing bad and got "really very sick". Since that time, he has been coughing daily.  Prior to that, he was just coughing during work, and gets better during days off.  He felt sick that night -- throwing up, fevers, chills, SOB BUT he did not go to ER.  Saw PCP 2-3 days after the incident > work up was done. Sx got better except for cough.   Dr. Melvyn Novas gave him dulera > no effect. Symbicort I gave did not help.  Takes prilosec in am and zantac at night > no effect.  Cozaar was dc'd by Dr. Melvyn Novas and switched to bystolic.  Bystolic made him fatigued and weak. He stopped bystolic x 2 days.  PFT could not be done 2/2 intractable coughing. Not a good test.  Has not tried flonase. Takes Claritin daily.  Tried Prednisone before > not sure if it helped him.  He has not been admitted nor has been on abx since last seen by me.  Currently working still at [employer] but has not been buffing floors since June. Cough is not better.                                                                    ROV 01/23/16   Pt is in the office as f/u on his cough. The interview was with a [place] interpreter who also was with [place] last time.  Has never been dxed with asthma or copd or any lung problems.    He states he is the same as last month. He still coughs everyday. He coughs in his sleep. (-) fevers, chills.On and off cp x 8 months.    CP is exertional, more  Frequent, happens 2-3 times/day but not daily, lasts for 2-3 minutes, usually followed by cough.  There have been times wherein he just had cp with no cough or assoc sx. Had PFT last month which showed mild COPD BUT PFT technique was not good as he was coughing. Nebs made him  cough more. Restarted dulera > not making a difference. Still takes prilosec BID.     Takes flonase and singulair.   Not on pred. Has not taken time off.   Pt is scheduled to have [place] on 8/17. He has not been admitted nor has been on abx since last seen.      ROV 02/18/2016    Since last seen, he had elective cath on 8/17 and he was supposed to have stents to RCA but he went into VF requiring defibrillation. Stent was not done and is rescheduled for end of Oct.  Still with the same cough. Dry cough. Still SOB. Occasional wheezing with cough. Takes Pirolosec BID and Sucralfate QID. Takes flonase, zyrtec, singulair. This time, interpreter  Is via videophone [place].   I have noticed since the first time I saw him, his cough has gotten less (at least during the encounter) but pt denies.  Review of Systems  Constitutional: Negative.  Negative for fever and unexpected weight change.  HENT: Negative for congestion, dental problem, ear pain, nosebleeds, postnasal drip, rhinorrhea, sinus pressure, sneezing, sore throat and trouble swallowing.   Eyes: Negative.  Negative for redness and itching.  Respiratory: Positive for cough, chest tightness and shortness of breath. Negative for  wheezing.   Cardiovascular: Negative.  Negative for palpitations and leg swelling.  Gastrointestinal: Negative.  Negative for nausea and vomiting.  Endocrine: Negative.   Genitourinary: Negative.  Negative for dysuria.  Musculoskeletal: Positive for arthralgias. Negative for joint swelling.  Skin: Negative.  Negative for rash.  Allergic/Immunologic: Negative.   Neurological: Positive for headaches.  Hematological: Negative.  Does not bruise/bleed easily.  Psychiatric/Behavioral: Negative.  Negative for dysphoric mood. The patient is not nervous/anxious.       Objective:   Physical Exam   Vitals:  Vitals:   02/18/16 1610  BP: 118/72  Pulse: 82  SpO2: 97%    Constitutional/General:  Pleasant, well-nourished, well-developed, not in any distress,  Comfortably seating.  Well kempt. Coughs every now and then but less than before.   There is no height or weight on file to calculate BMI. Wt Readings from Last 3 Encounters:  02/11/16 195 lb (88.5 kg)  02/05/16 193 lb 12.6 oz (87.9 kg)  01/23/16 197 lb (89.4 kg)    HEENT: Pupils equal and reactive to light and accommodation. Anicteric sclerae. Normal nasal mucosa.   No oral  lesions,  mouth clear,  oropharynx clear, no postnasal drip. (-) Oral thrush. No dental caries.  Airway - Mallampati class III  Neck: No masses. Midline trachea. No JVD, (-) LAD. (-) bruits appreciated.  Respiratory/Chest: Grossly normal chest. (-) deformity. (-) Accessory muscle use.  Symmetric expansion. (-) Tenderness on palpation.  Resonant on percussion.  Diminished BS on both lower lung zones. (-) crackles, rhonchi. Occasional wheezing.  (-) egophony  Cardiovascular: Regular rate and  rhythm, heart sounds normal, no murmur or gallops, no peripheral edema  Gastrointestinal:  Normal bowel sounds. Soft, non-tender. No hepatosplenomegaly.  (-) masses.   Musculoskeletal:  Normal muscle tone. Normal gait.   Extremities: Grossly normal. (-) clubbing,  cyanosis.  (-) edema  Skin: (-) rash,lesions seen.   Neurological/Psychiatric : alert, oriented to time, place, person. Normal mood and affect           Assessment & Plan:  Cough Cough since 06/2015. Complicated history.  See history.  Bottom line, pt is implicating exposure to propane at work as the cause of his cough. He mentioned about workmans compensation during 12/2015 visit. No mention after that.   Chest CT scan (12/2015)  Mild scarring in RLL and lingula. (-) COPD/infiltrate PFT (12/2015)  Mild COPD BUT poor technique 2/2 coughing. FEV1  3.45  97% Blood test ([facility], 12/2015) (-). IgE was 49.  Abd [place] (01/2016)  Hepatic steatosis  Differentials: 1. Pt implicates exposure to propane while at work as cause of his cough.  He has had chronic cough during work, better with days off. On jan 15th or 16th of 2017, he felt "really very sick" at work with fevers, chills, nausea, vomiting, severe cough.  Has been coughing daily since that time.  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 Patient denies any history of asthma  or COPD or any lung problem preceding the cough.  2. UACS. Less likely at this point. Not better with flonase, singulair, zyrtec. 3. GERD. This is my main differential for  pt's cough. I think patient has underlying reflux disease causing him to have reflux cough. 4. Hypersensitivity reaction/asthma type reaction. Less likely at this point. No response to MDI (Dulera, Symbicort, Neb meds) 5. Pt has CAD. S/P LHC 8/17 and he was supposed to have an RCA stent but went into VF.  Doubt this is the main reason for cough but certainly, can contribute to it.    Plan : 1.Trial with prednisone, 30 mg/d x 1 week.  He might have done better slightly with prednisone before. If not better after 1 week, d/c prednisone.  2..Continue Prilosec twice a day. 3. Stop sucralfate after 1 week on prednisone ( 1 want to do 1 med change at a time) 4. Rx for UACS. Cont  singulair, claritin. Stop flonase 1 week after stopping sucralfate. 5.  Pt was asking whether he has COPD or not again.  I told him his PFT was suboptimal.  He told me he was not instructed well.  Plan on doing PFT again with an interpreter after his stent.  6. Pt to have RCA stent end of Oct.  7. Pt will need a EGD as part of work up after RCA stent.  8. May also need bronchoscopy later on.  9. Pt was asked not to work until after the stent.  He has been off work since [employer].  Observe how the cough is while off work.  If significantly better, then it might be related to work. (I wanted him to take off before but he did not want to)   GERD Cont PPI BID. Wean off sucralfate. Will need EGD after stent.   Return to clinic after his stent and PFT.    I spent at least 25 minutes with the patient today and more than 50% was spent counseling the patient regarding disease and management and facilitating labs and medications.             Monica Becton, MD 02/19/2016   6:16 AM Pulmonary and [place] Pager: [phone] Office: [phone], Fax: [phone]

## 2016-03-18 NOTE — Progress Notes (Signed)
Site area: right groin  Site Prior to Removal:  Level 0  Pressure Applied For 20  MINUTES    Minutes Beginning at 1455  Manual:   Yes.    Patient Status During Pull:  stable  Post Pull Groin Site:  Level 0  Post Pull Instructions Given:  Yes.    Post Pull Pulses Present:  Yes.    Dressing Applied:  Yes.    Comments:

## 2016-03-18 NOTE — Care Management Note (Signed)
Case Management Note  Patient Details  Name: [name] MRN: [id number] Date of Birth: Aug 14, 1955  Subjective/Objective:     S/p coronary stent, will be on plavix and asa, NCM will cont to follow for dc needs.                Action/Plan:   Expected Discharge Date:                  Expected Discharge Plan:  Home/Self Care  In-House Referral:     Discharge planning Services  CM Consult  Post Acute Care Choice:    Choice offered to:     DME Arranged:    DME Agency:     HH Arranged:    HH Agency:     Status of Service:  In process, will continue to follow  If discussed at Long Length of Stay Meetings, dates discussed:    Additional Comments:  Zenon Mayo, RN 03/18/2016, 4:03 PM

## 2016-03-18 NOTE — Interval H&P Note (Signed)
Cath Lab Visit (complete for each Cath Lab visit)  Clinical Evaluation Leading to the Procedure:   ACS: No.  Non-ACS:    Anginal Classification: CCS III  Anti-ischemic medical therapy: Minimal Therapy (1 class of medications)  Non-Invasive Test Results: No non-invasive testing performed  Prior CABG: No previous CABG      History and Physical Interval Note:  03/18/2016 10:56 AM  [name]  has presented today for surgery, with the diagnosis of cad  The various methods of treatment have been discussed with the patient and family. After consideration of risks, benefits and other options for treatment, the patient has consented to  Procedure(s): Coronary Stent Intervention (N/A) as a surgical intervention .  The patient's history has been reviewed, patient examined, no change in status, stable for surgery.  I have reviewed the patient's chart and labs.  Questions were answered to the patient's satisfaction.     Quay Burow

## 2016-03-19 ENCOUNTER — Encounter (HOSPITAL_COMMUNITY): Payer: Self-pay | Admitting: Cardiology

## 2016-03-19 ENCOUNTER — Telehealth: Payer: Self-pay | Admitting: *Deleted

## 2016-03-19 DIAGNOSIS — R079 Chest pain, unspecified: Secondary | ICD-10-CM | POA: Diagnosis not present

## 2016-03-19 DIAGNOSIS — R05 Cough: Secondary | ICD-10-CM | POA: Diagnosis not present

## 2016-03-19 DIAGNOSIS — Z9861 Coronary angioplasty status: Secondary | ICD-10-CM | POA: Diagnosis not present

## 2016-03-19 DIAGNOSIS — I10 Essential (primary) hypertension: Secondary | ICD-10-CM | POA: Diagnosis not present

## 2016-03-19 DIAGNOSIS — I2510 Atherosclerotic heart disease of native coronary artery without angina pectoris: Secondary | ICD-10-CM | POA: Diagnosis not present

## 2016-03-19 DIAGNOSIS — E785 Hyperlipidemia, unspecified: Secondary | ICD-10-CM | POA: Diagnosis not present

## 2016-03-19 LAB — BASIC METABOLIC PANEL
Anion gap: 5 (ref 5–15)
BUN: 13 mg/dL (ref 6–20)
CALCIUM: 8.8 mg/dL — AB (ref 8.9–10.3)
CO2: 26 mmol/L (ref 22–32)
CREATININE: 1.19 mg/dL (ref 0.61–1.24)
Chloride: 105 mmol/L (ref 101–111)
GFR calc Af Amer: >60 mL/min (ref >60)
GFR calc non Af Amer: >60 mL/min (ref >60)
GLUCOSE: 108 mg/dL — AB (ref 65–99)
Potassium: 4.2 mmol/L (ref 3.5–5.1)
Sodium: 136 mmol/L (ref 135–145)

## 2016-03-19 LAB — CBC
HCT: 38.4 % — ABNORMAL LOW (ref 39.0–52.0)
HEMOGLOBIN: 12.5 g/dL — AB (ref 13.0–17.0)
MCH: 27.5 pg (ref 26.0–34.0)
MCHC: 32.6 g/dL (ref 30.0–36.0)
MCV: 84.4 fL (ref 78.0–100.0)
PLATELETS: 275 10*3/uL (ref 150–400)
RBC: 4.55 MIL/uL (ref 4.22–5.81)
RDW: 14.2 % (ref 11.5–15.5)
WBC: 7.3 10*3/uL (ref 4.0–10.5)

## 2016-03-19 MED ORDER — ATORVASTATIN CALCIUM 10 MG PO TABS
10.0000 mg | ORAL_TABLET | ORAL | 11 refills | Status: DC
Start: 2016-03-19 — End: 2017-05-06

## 2016-03-19 MED ORDER — PANTOPRAZOLE SODIUM 40 MG PO TBEC: 40.0000 mg | DELAYED_RELEASE_TABLET | Freq: Every day | ORAL | 12 refills | Status: DC

## 2016-03-19 MED ORDER — ANGIOPLASTY BOOK
Freq: Once | Status: DC
  Filled 2016-03-19: qty 1

## 2016-03-19 MED FILL — Lidocaine HCl Local Preservative Free (PF) Inj 1%: INTRAMUSCULAR | Qty: 30 | Status: CN

## 2016-03-19 MED FILL — Nitroglycerin IV Soln 100 MCG/ML in D5W: INTRA_ARTERIAL | Qty: 10 | Status: AC

## 2016-03-19 NOTE — Care Management Note (Signed)
Case Management Note  Patient Details  Name: ELMUS MCELWEE MRN: [id number] Date of Birth: 08/30/1955  Subjective/Objective:   S/p coronary stent, on plavix and asa, for dc today, no needs.                 Action/Plan:   Expected Discharge Date:                  Expected Discharge Plan:  Home/Self Care  In-House Referral:     Discharge planning Services  CM Consult  Post Acute Care Choice:    Choice offered to:     DME Arranged:    DME Agency:     HH Arranged:    HH Agency:     Status of Service:  Completed, signed off  If discussed at [employer] of Stay Meetings, dates discussed:    Additional Comments:  Zenon Mayo, RN 03/19/2016, 10:39 AM

## 2016-03-19 NOTE — Discharge Summary (Signed)
Discharge Summary    Patient ID: [name],  MRN: [id number], DOB/AGE: 07-07-1955 60 y.o.  Admit date: 03/18/2016 Discharge date: 03/19/2016  Primary Care Provider: Walker Benson Primary Cardiologist: Dr. Gwenlyn Benson  Discharge Diagnoses    Active Problems:   Chest pain with moderate risk of acute coronary syndrome   CAD (coronary artery disease)   Coronary artery disease due to lipid rich plaque   Allergies Allergies  Allergen Reactions  . Flomax [Tamsulosin Hcl] Other (See Comments)    Hard time breathing  . Atorvastatin Other (See Comments)    REACTION: leg cramps per patient with a large dose    Diagnostic Studies/Procedures  Coronary Stent Intervention 03/18/16  IMPRESSION: Successful RCA PCI and drug eluting stenting using a Xience drug-eluting stent in the proximal to mid dominant RCA. The patient is already on dual antiplatelet therapy. The sheath will be removed in several hours and pressure held. The patient will stay overnight and be hydrated. He'll be discharged home in the morning. I will see him back in the office in 2-3 weeks for follow-up. _____________   History of Present Illness   Mr. [name] is a 60 year old male with a past medical history of HLD, HTN and family history of CAD. He reported having chest pain on and off for about 8 months. Chest pain was exertional and occurred 2-3 times per day. He had a nuclear stress test in January 2017 that was low risk. Given that he continued to have exertional chest pain and had risk factors he was referred for heart cath.   He had left heart cath on 02/05/16 that revealed a 60% stenosis in his mid RCA. His procedure was complicated by Vfib and he required shock and CPR. No intervention was done that day due to the complications so he returned for coronary stent intervention on 03/18/16.   Hospital Course  Mr. [name] underwent successful PCI to his mid RCA with a DES. Cath report above.   He will be  on DAPT for one year with ASA and plavix. He is not on a beta blocker as has been hypotensive in the past and unable to take most anti-hypertensives.   He is on 1.25mg  of amlodipine, which we will continue.   His right femoral site was stable without hematoma. He was seen today by Dr. Ellyn Benson and deemed suitable for discharge.  _____________  Discharge Vitals Blood pressure (!) 89/66, pulse 94, temperature 97.3 F (36.3 C), temperature source Oral, resp. rate 20, height 5\' 9"  (1.753 m), weight 204 lb 5.9 oz (92.7 kg), SpO2 95 %.  Filed Weights   03/18/16 0937 03/19/16 0350  Weight: 195 lb (88.5 kg) 204 lb 5.9 oz (92.7 kg)    Physical Exam  GEN: Well nourished, well developed, in no acute distress  HEENT: normal  Neck: no JVD, carotid bruits, or masses Cardiac: RRR; no murmurs, rubs, or gallops,no edema  Respiratory:  clear to auscultation bilaterally, normal work of breathing GI: soft, nontender, nondistended, + BS MS: no deformity or atrophy  Skin: warm and dry, no rash Neuro:  Alert and Oriented x 3, Strength and sensation are intact Psych: euthymic mood, full affect    Labs & Radiologic Studies     CBC  Recent Labs  03/19/16 0340  WBC 7.3  HGB 12.5*  HCT 38.4*  MCV 84.4  PLT 123XX123   Basic Metabolic Panel  Recent Labs  03/19/16 0340  NA 136  K 4.2  CL 105  CO2 26  GLUCOSE 108*  BUN 13  CREATININE 1.19  CALCIUM 8.8*     Disposition   Pt is being discharged home today in good condition.  Follow-up Plans & Appointments    Follow-up Information    Barrett, Suanne Marker, PA-C Follow up on 04/05/2016.   Specialties:  Cardiology, Radiology Why:  at 9:30am for hospital follow up with Dr. Kennon Holter PA.  Contact information: [address] [place] [address] [phone]          Discharge Instructions    Amb Referral to Cardiac Rehabilitation    Complete by:  As directed    Diagnosis:   PTCA Coronary Stents     Diet - low sodium heart  healthy    Complete by:  As directed    Discharge instructions    Complete by:  As directed    NO HEAVY LIFTING OR SEXUAL ACTIVITY for 7 DAYS. NO DRIVING for 3-5 DAYS. NO SOAKING BATHS, HOT TUBS, POOLS, ETC., for 7 DAYS  Groin Site Care Refer to this sheet in the next few weeks. These instructions provide you with information on caring for yourself after your procedure. Your caregiver may also give you more specific instructions. Your treatment has been planned according to current medical practices, but problems sometimes occur. Call your caregiver if you have any problems or questions after your procedure. HOME CARE INSTRUCTIONS You may shower 24 hours after the procedure. Remove the bandage (dressing) and gently wash the site with plain soap and water. Gently pat the site dry.  Do not apply powder or lotion to the site.  Do not sit in a bathtub, swimming pool, or whirlpool for 5 to 7 days.  No bending, squatting, or lifting anything over 10 pounds (4.5 kg) as directed by your caregiver.  Inspect the site at least twice daily.  Do not drive home if you are discharged the same day of the procedure. Have someone else drive you.  You may drive 24 hours after the procedure unless otherwise instructed by your caregiver.  What to expect: Any bruising will usually fade within 1 to 2 weeks.  Blood that collects in the tissue (hematoma) may be painful to the touch. It should usually decrease in size and tenderness within 1 to 2 weeks.  SEEK IMMEDIATE MEDICAL CARE IF: You have unusual pain at the groin site or down the affected leg.  You have redness, warmth, swelling, or pain at the groin site.  You have drainage (other than a small amount of blood on the dressing).  You have chills.  You have a fever or persistent symptoms for more than 72 hours.  You have a fever and your symptoms suddenly get worse.  Your leg becomes pale, cool, tingly, or numb.  You have heavy bleeding from the site. Hold  pressure on the site. .   Increase activity slowly    Complete by:  As directed       Discharge Medications   Current Discharge Medication List    START taking these medications   Details  pantoprazole (PROTONIX) 40 MG tablet Take 1 tablet (40 mg total) by mouth daily. Qty: 30 tablet, Refills: 12      CONTINUE these medications which have CHANGED   Details  atorvastatin (LIPITOR) 10 MG tablet Take 1 tablet (10 mg total) by mouth once a week. Qty: 30 tablet, Refills: 11      CONTINUE these medications which have NOT CHANGED  Details  amLODipine (NORVASC) 2.5 MG tablet Take 0.5 tablets (1.25 mg total) by mouth daily. Qty: 30 tablet, Refills: 11    aspirin EC 81 MG tablet Take 1 tablet (81 mg total) by mouth daily. Qty: 30 tablet, Refills: 0    clopidogrel (PLAVIX) 75 MG tablet Take 1 tablet (75 mg total) by mouth daily. Qty: 30 tablet, Refills: 11    doxazosin (CARDURA) 8 MG tablet TAKE ONE-HALF TABLET BY MOUTH TWICE DAILY Qty: 90 tablet, Refills: 2    fluticasone (FLONASE) 50 MCG/ACT nasal spray Place 2 sprays into both nostrils at bedtime. Qty: 16 g, Refills: 5    loratadine (CLARITIN) 10 MG tablet Take 1 tablet (10 mg total) by mouth daily. Qty: 30 tablet, Refills: 11    montelukast (SINGULAIR) 10 MG tablet Take 1 tablet (10 mg total) by mouth at bedtime. Qty: 30 tablet, Refills: 5      STOP taking these medications     omeprazole (PRILOSEC OTC) 20 MG tablet      sucralfate (CARAFATE) 1 g tablet      predniSONE (DELTASONE) 10 MG tablet           Outstanding Labs/Studies   BMP  Duration of Discharge Encounter   Greater than 30 minutes including physician time.  Signed, Arbutus Leas NP 03/19/2016, 9:12 AM

## 2016-03-19 NOTE — Progress Notes (Signed)
CARDIAC REHAB PHASE I   PRE:  Rate/Rhythm: 100 SR  BP:  Sitting: 89/66       SaO2:   MODE:  Ambulation: 1000 ft   POST:  Rate/Rhythm: 95 SR  BP:  Sitting: 96/71        SaO2:   Pt. Ambulated 103ft w/o assistance.  Reviewed restrictions, diet and exercise guidelines with pt.  Discussed the importance of Plavix/ASA and NTG.  Discussed CRPII and pt seemed accepting and eager.  Will refer to [place], [place] 03/19/2016 8:58 AM

## 2016-03-21 ENCOUNTER — Encounter: Payer: Self-pay | Admitting: Cardiovascular Disease

## 2016-03-22 ENCOUNTER — Encounter: Payer: Self-pay | Admitting: *Deleted

## 2016-03-22 ENCOUNTER — Telehealth: Payer: Self-pay | Admitting: Cardiovascular Disease

## 2016-03-22 ENCOUNTER — Other Ambulatory Visit: Payer: Self-pay | Admitting: Cardiology

## 2016-03-22 NOTE — Telephone Encounter (Addendum)
Recent cath w/ PCI. 9/29 by Dr. Ellyn Hack  Pt notes he needs work note to inform his employer of any restrictions on activity. States he wants the letter to note that he gets short of breath/chest tightness with walking/working fast. Wants letter to be out of work for 1 month. Advised patient I would need to have Dr. Gwenlyn Found review his request and make determination on whether modified duty or a work leave of absence would be appropriate. Aware that he may also need short term disability papers filled out and sent to our office for signature.  I have informed patient the request has been sent to Dr. Gwenlyn Found to review.

## 2016-03-22 NOTE — Telephone Encounter (Signed)
Will discuss when I see him back in the office for his post hospital follow-up

## 2016-03-22 NOTE — Telephone Encounter (Signed)
New message     Pt calling about getting a letter for his job release for me from job for 1 more month?  Please call.

## 2016-03-22 NOTE — Telephone Encounter (Signed)
Notified patient his mychart msg has been sent to Dr. Gwenlyn Found to review.

## 2016-03-22 NOTE — Telephone Encounter (Signed)
New message    Pt verbalized that 9/29 he had craterization  Pt c/o of Chest Pain: STAT if CP now or developed within 24 hours  1. Are you having CP right now? no 2. Are you experiencing any other symptoms (ex. SOB, nausea, vomiting, sweating)? no  3. How long have you been experiencing CP?  03/21/16  4. Is your CP continuous or coming and going? Coming and going when he walks fast  5. Have you taken Nitroglycerin? No  Pt needs work note for October 5th for one month to November 5th, and to go to rehab ?

## 2016-03-23 ENCOUNTER — Ambulatory Visit (INDEPENDENT_AMBULATORY_CARE_PROVIDER_SITE_OTHER): Payer: BLUE CROSS/BLUE SHIELD

## 2016-03-23 DIAGNOSIS — Z23 Encounter for immunization: Secondary | ICD-10-CM

## 2016-03-24 NOTE — Telephone Encounter (Signed)
F/u message  Pt call requesting to speak with RN. Pt ask that Dr. Gwenlyn Found send his employer a work release form for a month on his behalf. Please call back to discuss

## 2016-03-24 NOTE — Telephone Encounter (Signed)
F/u ° ° ° ° ° °Pt returning nurse call.  °

## 2016-03-24 NOTE — Telephone Encounter (Signed)
Sent to [employer].

## 2016-03-24 NOTE — Telephone Encounter (Signed)
No answer. Left message to call back.   

## 2016-03-24 NOTE — Telephone Encounter (Signed)
Spoke with Dr Gwenlyn Found regarding patient return to work prior to him leaving clinic today.  Dr Gwenlyn Found stated patient could remain out of work until April 05, 2016. At that point he could return to work with no restrictions.

## 2016-03-25 ENCOUNTER — Encounter: Payer: Self-pay | Admitting: *Deleted

## 2016-03-25 ENCOUNTER — Telehealth: Payer: Self-pay | Admitting: Internal Medicine

## 2016-03-25 NOTE — Telephone Encounter (Signed)
Received incoming call from patient. He said he had responded via [place] email with fax numbers.  Letter generated in [facility] and faxed to [phone] and [phone].

## 2016-03-25 NOTE — Telephone Encounter (Signed)
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

## 2016-03-25 NOTE — Telephone Encounter (Signed)
Returned call to patient. His mother answered, not on DPR. Asked if [name] was home, on DPR and I spoke with him regarding patient. Gave him Dr Kennon Holter recommendation and asked to have patient call with information on where to fax a letter re: work.

## 2016-03-29 ENCOUNTER — Telehealth (HOSPITAL_COMMUNITY): Payer: Self-pay | Admitting: *Deleted

## 2016-03-29 NOTE — Telephone Encounter (Signed)
Returned call from message left this morning.  Pt desired to get set up in the rehab program.  Pt given first available appt on 10/31.  Pt upset that it would take this long. Pt stated that his cardiac event was 9/28. Explained that appointments may come up sooner.  Pt did not want to participate in cardiac rehab but asked if he could be called if something came up sooner.  Cherre Huger, BSN

## 2016-03-30 ENCOUNTER — Telehealth: Payer: Self-pay | Admitting: Cardiovascular Disease

## 2016-03-30 NOTE — Telephone Encounter (Signed)
New message  Pt mother call requesting to speak with RN to get a message to Dr. Gwenlyn Found. Pt mother would like to send her appreciation for all his help with pt. Please call back to discuss

## 2016-03-30 NOTE — Telephone Encounter (Signed)
Spoke to patient regarding his concerns. He notes he is set up to see Rhonda on 10/16 and would like a note out of work for 2-3 weeks after this. Aware Dr. Gwenlyn Found already wrote him out of work up to appt date. Explained to patient that he would need to be assessed at appt and work status recommendations given at that time. Pt voiced understanding.  He also had separate request about getting enrolled in cardiac rehab - slots not available until end of October. Aware I will send a message to cardiac rehab to see if he can be put in earlier. Pt voiced thanks and understanding.

## 2016-03-31 ENCOUNTER — Telehealth: Payer: Self-pay | Admitting: Physician Assistant

## 2016-03-31 NOTE — Telephone Encounter (Signed)
Returned call to [place] with [employer].She stated she went to see patient this morning B/P low 100/70,85/50.Stated he gets dizzy when he bends.Spoke to [employer] he advised to stop Amlodipine.Keep appointment with Rosaria Ferries 04/05/16 as planned.

## 2016-03-31 NOTE — Telephone Encounter (Signed)
Mrs.Kim is calling because she is not very happy with the pt bp was 87/55 reported by pt wife then the nurse took his bp at around 1pm today and the reading was 100/70. She states pt has no complains but feels alilttle dizzy when bending over to pick up things. Nurse would like someone to call pt and advise if he needs to be seen sooner than Monday. Thanks

## 2016-04-05 ENCOUNTER — Encounter: Payer: Self-pay | Admitting: *Deleted

## 2016-04-05 ENCOUNTER — Other Ambulatory Visit: Payer: Self-pay | Admitting: Student

## 2016-04-05 ENCOUNTER — Encounter: Payer: Self-pay | Admitting: Physician Assistant

## 2016-04-05 ENCOUNTER — Ambulatory Visit (INDEPENDENT_AMBULATORY_CARE_PROVIDER_SITE_OTHER): Payer: BLUE CROSS/BLUE SHIELD | Admitting: Student

## 2016-04-05 VITALS — BP 100/80 | HR 98 | Ht 69.0 in | Wt 203.0 lb

## 2016-04-05 DIAGNOSIS — I2510 Atherosclerotic heart disease of native coronary artery without angina pectoris: Secondary | ICD-10-CM | POA: Diagnosis not present

## 2016-04-05 DIAGNOSIS — R072 Precordial pain: Secondary | ICD-10-CM

## 2016-04-05 DIAGNOSIS — E785 Hyperlipidemia, unspecified: Secondary | ICD-10-CM | POA: Diagnosis not present

## 2016-04-05 DIAGNOSIS — I10 Essential (primary) hypertension: Secondary | ICD-10-CM

## 2016-04-05 DIAGNOSIS — I2583 Coronary atherosclerosis due to lipid rich plaque: Secondary | ICD-10-CM

## 2016-04-05 DIAGNOSIS — I739 Peripheral vascular disease, unspecified: Secondary | ICD-10-CM

## 2016-04-05 MED ORDER — ISOSORBIDE MONONITRATE ER 30 MG PO TB24: 15.0000 mg | ORAL_TABLET | Freq: Every day | ORAL | 3 refills | Status: DC

## 2016-04-05 NOTE — Patient Instructions (Signed)
Medication Instructions:   START ISOSORBIDE 15 MG ONCE DAILY= 1/2 OF THE 30 MG TABLET ONCE DAILY= DO NOT TAKE IF BLOOD PRESSURE IS BELOW 100  Testing/Procedures:  Your physician has requested that you have an echocardiogram. Echocardiography is a painless test that uses sound waves to create images of your heart. It provides your doctor with information about the size and shape of your heart and how well your heart's chambers and valves are working. This procedure takes approximately one hour. There are no restrictions for this procedure.PLEASE SCHEDULE WITHIN ONE WEEK  Your physician has requested that you have a lower extremity arterial duplex. During this test, ultrasound are used to evaluate arterial blood flow in the legs. Allow one hour for this exam. There are no restrictions or special instructions.     Follow-Up:  Your physician recommends that you schedule a follow-up appointment in: [place]

## 2016-04-05 NOTE — Progress Notes (Signed)
Cardiology Office Note    Date:  04/05/2016   ID:  [name], DOB 03-13-1956, MRN [id number]  PCP:  Walker Kehr, MD  Cardiologist: Dr. Gwenlyn Found   Chief Complaint  Patient presents with  . Chest Pain    CHEST TIGHTNESS AND VERTIGO    History of Present Illness:    [name] is a 60 y.o. male with past medical history of CAD, HTN, and HLD who presents to the office today for hospital follow-up.   Had initially went for a cardiac catheterization on 02/05/2016 which showed 60% stenosis in the proximal to mid-RCA. He went into ventricular fibrillation and required CPR and defibrillation. Dr. Gwenlyn Found felt the RCA should be intervened on percutaneously at a later date. He was brought back to the cath lab on 9/28 and had a Xience DES to the 60% stenosis of the proximal RCA. Was started on DAPT with ASA and Plavix. Unable to tolerate low-dose BB secondary to hypotension. Was continued on low-dose Amlodipine 1.63m daily.   In talking with the patient today, he reports multiple complaints. The first being recurrent chest pain. He has a stabbing chest pain in the AM after consuming breakfast. This has occurred at least 3 times in the past week. However, he also notes episodes with activity saying it is worse with activity and relieved with rest. The pain is not as intense as it was prior to cath. Says it is partially made worse by bending over. No associated diaphoresis, dyspnea, nausea, and or vomiting. Pain can last for 30 minutes to 1 hour.  Has been checking BP at home with SBP readings ranging from 80 - 140. HR has been from 70 - 105.  Notes dates and times of these and sometimes checks BP 6+ times per day. Informed to check this once daily unless having symptoms.  Reports good compliance with his medications which include ASA, Plavix, and statin (once weekly). Is currently on Cardura 435mBID and says he has been on this dose for years secondary to BPH. Reduced dosing of  this has not worked for him in the past.  Today, he also reports recurrent claudication. Says he feels pain throughout its lower extremities daily. Nothing seems to make the pain better or worse. No diagnosis of diabetes mellitus.   He has not yet returned to work and is asking for an extension of this.   Past Medical History:  Diagnosis Date  . [place]  . CAD (coronary artery disease)    a.cath 01/2016: 60% stenosis RCA. Went into VF during procedure requiring CPR and defibrillation. No intervention performed at that time b.cath 02/2016: 3 mm x 15 mm Xience DES to prox-RCA   . Coronary artery disease   . GERD (gastroesophageal reflux disease)   . Headache(784.0)   . Hyperlipidemia   . Kidney stones    "passed it" (03/18/2016)  . URI (upper respiratory infection)     Past Surgical History:  Procedure Laterality Date  . CARDIAC CATHETERIZATION N/A 02/05/2016   Procedure: Left Heart Cath and Coronary Angiography;  Surgeon: JoLorretta HarpMD;  [facility];  Service: Cardiovascular;  Laterality: N/A;  . CARDIAC CATHETERIZATION N/A 03/18/2016   Procedure: Coronary Stent Intervention;  Surgeon: JoLorretta HarpMD;  [facility];  Service: Cardiovascular;  Laterality: N/A;  RCA  . CORONARY ANGIOPLASTY    . INGUINAL HERNIA REPAIR Bilateral   . TONSILLECTOMY  Current Medications: Outpatient Medications Prior to Visit  Medication Sig Dispense Refill  . aspirin EC 81 MG tablet Take 1 tablet (81 mg total) by mouth daily. 30 tablet 0  . atorvastatin (LIPITOR) 10 MG tablet Take 1 tablet (10 mg total) by mouth once a week. 30 tablet 11  . clopidogrel (PLAVIX) 75 MG tablet Take 1 tablet (75 mg total) by mouth daily. 30 tablet 11  . doxazosin (CARDURA) 8 MG tablet TAKE ONE-HALF TABLET BY MOUTH TWICE DAILY (Patient taking differently: Take 4 mg by mouth twice daily) 90 tablet 2  . loratadine (CLARITIN) 10 MG tablet Take 1 tablet (10 mg  total) by mouth daily. 30 tablet 11  . montelukast (SINGULAIR) 10 MG tablet Take 1 tablet (10 mg total) by mouth at bedtime. 30 tablet 5  . pantoprazole (PROTONIX) 40 MG tablet Take 1 tablet (40 mg total) by mouth daily. 30 tablet 12  . fluticasone (FLONASE) 50 MCG/ACT nasal spray Place 2 sprays into both nostrils at bedtime. 16 g 5   No facility-administered medications prior to visit.      Allergies:   Flomax [tamsulosin hcl] and Atorvastatin   Social History   Social History  . Marital status: Divorced    Spouse name: N/A  . Number of children: 1  . Years of education: N/A   Occupational History  . Animal nutritionist    Social History Main Topics  . Smoking status: Never Smoker  . Smokeless tobacco: Never Used  . Alcohol use No     Comment: rare  . Drug use: No  . Sexual activity: No   Other Topics Concern  . None   Social History Narrative   Coffee daily      Family History:  The patient's family history includes Coronary artery disease in his father and mother; Diabetes in his father; Heart attack in his maternal grandfather and mother; Heart disease in his father and mother; Hypertension in his mother; Kidney disease in his mother; Liver disease in his mother.   Review of Systems:   Please see the history of present illness.     General:  No chills, fever, night sweats or weight changes.  Cardiovascular:  No dyspnea on exertion, edema, orthopnea, palpitations, paroxysmal nocturnal dyspnea. Positive for chest pain.  Dermatological: No rash, lesions/masses Respiratory: No cough, dyspnea Urologic: No hematuria, dysuria Abdominal:   No nausea, vomiting, diarrhea, bright red blood per rectum, melena, or hematemesis Neurologic:  No visual changes, wkns, changes in mental status. Positive for lower extremity claudication. All other systems reviewed and are otherwise negative except as noted above.   Physical Exam:    VS:  BP 100/80   Pulse 98   Ht _0  (1.753 m)    Wt 203 lb (92.1 kg)   BMI 29.98 kg/m    General: Well developed, well nourished,male appearing in no acute distress. Head: Normocephalic, atraumatic, sclera non-icteric, no xanthomas, nares are without discharge.  Neck: No carotid bruits. JVD not elevated.  Lungs: Respirations regular and unlabored, without wheezes or rales.  Heart: Regular rate and rhythm. No S3 or S4.  No murmur, no rubs, or gallops appreciated.Nontender to palpation along precordium. Abdomen: Soft, non-tender, non-distended with normoactive bowel sounds. No hepatomegaly. No rebound/guarding. No obvious abdominal masses. Msk:  Strength and tone appear normal for age. No joint deformities or effusions. Extremities: No clubbing or cyanosis. No edema.  Distal pedal pulses are 1+ bilaterally. Neuro: Alert and oriented X 3. Moves all extremities spontaneously.  No focal deficits noted. Psych:  Responds to questions appropriately with a normal affect. Skin: No rashes or lesions noted  Wt Readings from Last 3 Encounters:  04/05/16 203 lb (92.1 kg)  03/19/16 204 lb 5.9 oz (92.7 kg)  02/11/16 195 lb (88.5 kg)     Studies/Labs Reviewed:   EKG:  EKG is ordered today.  The ekg ordered today demonstrates NSR, HR 98, with minimal isolated TWI in aVL.  Recent Labs: 12/29/2015: ALT 33; TSH 0.87 03/19/2016: BUN 13; Creatinine, Ser 1.19; Hemoglobin 12.5; Platelets 275; Potassium 4.2; Sodium 136   Lipid Panel    Component Value Date/Time   CHOL 176 07/30/2015 1404   TRIG 238.0 (H) 07/30/2015 1404   HDL 41.70 07/30/2015 1404   CHOLHDL 4 07/30/2015 1404   VLDL 47.6 (H) 07/30/2015 1404   LDLCALC 87 12/30/2014 0217   LDLDIRECT 90.0 07/30/2015 1404    Additional studies/ records that were reviewed today include:   Cardiac Catheterization: 03/18/2016  Prox RCA lesion, 60 %stenosed.  Post intervention, there is a 0% residual stenosis.  A stent was successfully placed.   PROCEDURE DESCRIPTION:  The patient was brought to the  second floor [place] [facility] cath lab in the postabsorptive state. He was premedicated with Valium 5 mg by mouth, IV Versed and fentanyl. His right groinwas prepped and shaved in usual sterile fashion. Xylocaine 1% was used for local anesthesia. A 6 French sheath was inserted into the right common femoral artery using standard Seldinger technique.   The patient received Angiomax bolus followed by infusion with a therapeutically demonstrated  ACT. Isovue dye was used for the entirety of the case. Retrograde aortic pressure was monitored during the case. Using a 6 [place] JR4 guide catheter with sideholes along with an 0140 Pro water guidewire and a 2 mm balloon predilatation was performed. Following this a 3 mm x 15 mm Xience drug-eluting stent was carefully repositioned angiographically and deployed at 16 atm. Postdilatation was performed with the 3.25 mm x 12 mm millimeter long noncompliant balloon at 16 atm (3.3 mm) resulting reduction of a 60% hypodense proximal to mid dominant RCA stenosis to 0% residual with excellent flow. There were no hemodynamic or electrical  Sequela. The patient tolerated the procedure well. The guidewire and catheter were removed. The sheath was secured. Intramaxillary was discontinued.   IMPRESSION: Successful RCA PCI and drug eluting stenting using a Xience drug-eluting stent in the proximal to mid dominant RCA. The patient is already on dual antiplatelet therapy. The sheath will be removed in several hours and pressure held. The patient will stay overnight and be hydrated. He'll be discharged home in the morning. I will see him back in the office in 2-3 weeks for follow-up.  Assessment:    1. Coronary artery disease due to lipid rich plaque   2. Precordial pain   3. Essential hypertension   4. Dyslipidemia   5. Claudication [facility])      Plan:   In order of problems listed above:  1. Coronary Artery Disease/ Precordial Chest Pain with Mixed Typical and Atypical  Features - cardiac catheterization on 02/05/2016 showed 60% stenosis in the proximal to mid-RCA but during the procedure, he went into VF and required CPR and defibrillation. Went back for cath on 9/28 and had a Xience DES to the 60% stenosis of the proximal RCA. On DAPT with ASA and Plavix. No BB secondary to hypotension.  - presents today reporting continued chest pain. Can last for 30-60 minutes.  Sometimes made worse by consuming food and positional changes, while other times it is worse with exertion and relieved with rest. Symptoms are not as intense as they were prior to cath.  - symptoms are atypical. Possible pleuritic component in being worse with positions but then at times is worse with food which could be secondary to GERD. Pain is not reproducible with palpation, as I thought the pressure applied during CPR could be contributing to his symptoms. With his symptoms sometimes worse with exertion, will obtain limited echo to assess EF and wall motion. EKG today is without acute changes. -  He has once taken his wife's SL NTG which helped with his pain. I am concerned the SL NTG would drop his BP too excessively and cause hypotension. Will try the addition of low-dose Imdur 39m daily with instructions to hold dose if SBP < 100. - of mention, the patient asked for additional time out of work today. It has been over 2 weeks since his catheterization. I instructed the patient he can return to work at this time. 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 Will write for weight limit of < 20 lbs for the next 2 weeks as he has not been overly physically active since his cath then no restrictions.  2. Essential HTN - Has been hypotensive at times with SBP varying from 80's - 140's. Refuses reduced dosing of Cardura due to worsening BPH symptoms with lower dose. - Patient self-discontinued Amlodipine 2 weeks ago. Will try low-dose Imdur 156mdaily. Instructed to hold if SBP < 100.  3.  HLD - intolerant to daily dosing. LDL 90 in 07/2015. Continue with Atorvastatin 1039mnce weekly.  - followed by PCP.  4. Lower Extremity Claudication - reports pain along his lower extremities bilaterally. Nothing makes it better or worse. Dorsalis pedis pulses are appreciated on exam but mildly diminished.  - will obtain lower extremity arterial doppler studies. Has follow-up with Dr. BerGwenlyn Foundxt month.   Medication Adjustments/Labs and Tests Ordered: Current medicines are reviewed at length with the patient today.  Concerns regarding medicines are outlined above.  Medication changes, Labs and Tests ordered today are listed in the Patient Instructions below. Patient Instructions  Medication Instructions:   START ISOSORBIDE 15 MG ONCE DAILY= 1/2 OF THE 30 MG TABLET ONCE DAILY= DO NOT TAKE IF BLOOD PRESSURE IS BELOW 100  Testing/Procedures: Your physician has requested that you have an echocardiogram. Echocardiography is a painless test that uses sound waves to create images of your heart. It provides your doctor with information about the size and shape of your heart and how well your heart's chambers and valves are working. This procedure takes approximately one hour. There are no restrictions for this procedure.PLEASE SCHEDULE WITHIN ONE WEEK  Your physician has requested that you have a lower extremity arterial duplex. During this test, ultrasound are used to evaluate arterial blood flow in the legs. Allow one hour for this exam. There are no restrictions or special instructions.     Follow-Up: Your physician recommends that you schedule a follow-up appointment in: [facility]  04/05/2016 11:06 AM    [place]  [id number]: [phone]: [phone] [address] [id number]: [phone]

## 2016-04-07 ENCOUNTER — Encounter (INDEPENDENT_AMBULATORY_CARE_PROVIDER_SITE_OTHER): Payer: BLUE CROSS/BLUE SHIELD | Admitting: Pulmonary Disease

## 2016-04-07 DIAGNOSIS — R0600 Dyspnea, unspecified: Secondary | ICD-10-CM

## 2016-04-07 DIAGNOSIS — R0609 Other forms of dyspnea: Secondary | ICD-10-CM | POA: Diagnosis not present

## 2016-04-07 LAB — PULMONARY FUNCTION TEST
DL/VA % pred: 71 %
DL/VA: 3.26 ml/min/mmHg/L
DLCO UNC: 19.91 ml/min/mmHg
DLCO cor % pred: 63 %
DLCO cor: 19.74 ml/min/mmHg
DLCO unc % pred: 64 %
FEF 25-75 PRE: 3.97 L/s
FEF 25-75 Post: 3.79 L/sec
FEF2575-%Change-Post: -4 %
FEF2575-%Pred-Post: 127 %
FEF2575-%Pred-Pre: 133 %
FEV1-%CHANGE-POST: 0 %
FEV1-%PRED-POST: 99 %
FEV1-%PRED-PRE: 100 %
FEV1-POST: 3.51 L
FEV1-PRE: 3.54 L
FEV1FVC-%Change-Post: 5 %
FEV1FVC-%Pred-Pre: 108 %
FEV6-%Change-Post: -5 %
FEV6-%PRED-POST: 90 %
FEV6-%PRED-PRE: 96 %
FEV6-POST: 4.04 L
FEV6-Pre: 4.28 L
FEV6FVC-%PRED-POST: 105 %
FEV6FVC-%Pred-Pre: 105 %
FVC-%CHANGE-POST: -5 %
FVC-%PRED-PRE: 91 %
FVC-%Pred-Post: 86 %
FVC-POST: 4.04 L
FVC-PRE: 4.28 L
POST FEV6/FVC RATIO: 100 %
PRE FEV1/FVC RATIO: 83 %
PRE FEV6/FVC RATIO: 100 %
Post FEV1/FVC ratio: 87 %
RV % PRED: 90 %
RV: 1.94 L
TLC % PRED: 93 %
TLC: 6.37 L

## 2016-04-14 ENCOUNTER — Ambulatory Visit (INDEPENDENT_AMBULATORY_CARE_PROVIDER_SITE_OTHER): Payer: BLUE CROSS/BLUE SHIELD | Admitting: Pulmonary Disease

## 2016-04-14 ENCOUNTER — Encounter: Payer: Self-pay | Admitting: Pulmonary Disease

## 2016-04-14 DIAGNOSIS — K219 Gastro-esophageal reflux disease without esophagitis: Secondary | ICD-10-CM | POA: Diagnosis not present

## 2016-04-14 DIAGNOSIS — R05 Cough: Secondary | ICD-10-CM | POA: Diagnosis not present

## 2016-04-14 DIAGNOSIS — R059 Cough, unspecified: Secondary | ICD-10-CM

## 2016-04-14 NOTE — Assessment & Plan Note (Signed)
Try to cut down PPI to daily. Sucralfate has been weaned off. May need GI evaluation if cough worsens again.

## 2016-04-14 NOTE — Patient Instructions (Signed)
It was a pleasure taking care of you today!  Your cough is most likely multifactorial. Likely related to the following: A. Upper Airway Cough Syndrome  Continue Singulair and Claritin.    B. GERD  Try to decrease the Protonix or Pantoprazole at once at bedtime from 2x/day.     We will try this plan and see if your cough gets better. Please call the office if your cough worsens while on treatment.  Follow up with me on Dec 8th.

## 2016-04-14 NOTE — Assessment & Plan Note (Addendum)
Cough since 06/2015. Complicated history.  See history.  Bottom line, pt is implicating exposure to propane at work as the cause of his cough. He mentioned about workmans compensation during 12/2015 visit. No mention after that.   Cough is significantly improved. He took prednisone for a week or so in August and that improved the cough. He also ended up getting cardiac stents. He was off work for 2 months because of his cardiac issues, cough significantly better during this time as well. He started back work 5 days ago.  Chest CT scan (12/2015)  Mild scarring in RLL and lingula. (-) COPD/infiltrate PFT 04/07/16 FEV1/FVC 83%, FEV1  3.54 100%. N LV. DLCO 64% Blood test ([facility], 12/2015) (-). IgE was 49.  Abd [place] (01/2016)  Hepatic steatosis  Differentials: 1. Pt implicates exposure to propane while at work as cause of his cough.  He has had chronic cough during work, better with days off. On jan 15th or 16th of 2017, he felt "really very sick" at work with fevers, chills, nausea, vomiting, severe cough.  Has been coughing daily since that time.  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 Patient denies any history of asthma  or COPD or any lung problem preceding the cough. Cough is significantly improved when he was out of work for 2 months because of cardiac issues. 2. UACS. Flonase has been weaned off and cough is still better. On claritin and singulair.  3. GERD. This is my main differential for pt's cough. I think patient has underlying reflux disease causing him to have reflux cough. On PPI BID. Sucralfate has been weaned off.  4. Hypersensitivity reaction/asthma type reaction. Cough significantly better with short course prednisone in 01/2016.  5. Pt has CAD. S/P LHC 8/17 and had stent in 02/2016.   Plan : 1. Currently, he is happy the way the cough is controlled with the current meds. He ultimately improved with a short course of prednisone.  2. We will see how the cough behaves  as he recently started back work. He is exposed again to propane as part of his work. We will see if the cough gets worse during work.  He was off work x 2 mos and cough was better.  3. .Continue Prilosec > try to decrease to daily. Sucralfate has been stopped.  4. Rx for UACS. Cont singulair, claritin. Flonase has been stopped.

## 2016-04-14 NOTE — Progress Notes (Signed)
Subjective:    Patient ID: [name], male    DOB: [birth year], 60 y.o.   MRN: [id number]  HPI   This is the case of [name], 60 y.o. Male, who was referred by Dr. Walker Kehr in consultation regarding cough.   As you very well know, patient is a non smoker. Not been diagnosed with asthma or copd.  Pt is originally from [place], has been in [place] almost 18 yrs.  No h/o allergies or sinus issues or chronic cough until start of the year.   Pt has been coughing since 06/2015 -- not sure what triggers are. He coughs on and off, work and at home. Not necessarily worse at sleep. Dry cough, some mucus sometimes. Cough better usually by itself.  Same cough severity and frequency x 6 mos.    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 He works 2-3 x/week. Does night shift from 11pm-8 am. His cough is mildly worse at work.   Cough did NOT start  after an infection. Was Rx abx but he did NOT take it. Took pred for a week and was not better.   Pt with exertional dyspnea at work x 6-7 mos.   Stess test in 06/2015 was N. Pt does not want LHC despite being advised this.   Has GERD. Not on meds.    Has lived at current house x 52yrs. (-) pets. (-) carpets, molds, stagnant water. (-) hot tub use.  (-) Recent ravel. No new meds.   ROV (12/31/15) Pt is here for f/u on cough. During this encounter, there was an interpreter who spoke to the pt in [place] to facilitate better communication. Has seen Noe Gens on 12/08/15 and Dr. Melvyn Novas 12/16/15 since last seen by me.  Meds were changed. Symbicort to [facility] and Dr. Melvyn Novas stopped ARB and gave him bystolic. Since last seen, he states his cough is worse, despite med changes.  He took his meds as prescribed. Coughs all day and night. He has been at [facility] [employer] since 08/2013 and he has been buffing the floors since that time. Cough started in 06/2015. He does shift work from 11pm-8am,  He  has 4 nights on and 3 nights off. He bellieves his cough is related to exposure to propane. He states on Jan 15 or 16th 2017, he started vomiting at work and he started coughing bad and got "really very sick". Since that time, he has been coughing daily.  Prior to that, he was just coughing during work, and gets better during days off.  He felt sick that night -- throwing up, fevers, chills, SOB BUT he did not go to ER.  Saw PCP 2-3 days after the incident > work up was done. Sx got better except for cough.   Dr. Melvyn Novas gave him dulera > no effect. Symbicort I gave did not help.  Takes prilosec in am and zantac at night > no effect.  Cozaar was dc'd by Dr. Melvyn Novas and switched to bystolic.  Bystolic made him fatigued and weak. He stopped bystolic x 2 days.  PFT could not be done 2/2 intractable coughing. Not a good test.  Has not tried flonase. Takes Claritin daily.  Tried Prednisone before > not sure if it helped him.  He has not been admitted nor has been on abx since last seen by me.  Currently working still at [employer] but has not been buffing floors since June. Cough is not better.                                                                    ROV 01/23/16   Pt is in the office as f/u on his cough. The interview was with a [place] interpreter who also was with [place] last time.  Has never been dxed with asthma or copd or any lung problems.    He states he is the same as last month. He still coughs everyday. He coughs in his sleep. (-) fevers, chills.On and off cp x 8 months.    CP is exertional, more  Frequent, happens 2-3 times/day but not daily, lasts for 2-3 minutes, usually followed by cough.  There have been times wherein he just had cp with no cough or assoc sx. Had PFT last month which showed mild COPD BUT PFT technique was not good as he was coughing. Nebs made him  cough more. Restarted dulera > not making a difference. Still takes prilosec BID.     Takes flonase and singulair.   Not on pred. Has not taken time off.   Pt is scheduled to have [facility] on 8/17. He has not been admitted nor has been on abx since last seen.      ROV 02/18/2016    Since last seen, he had elective cath on 8/17 and he was supposed to have stents to RCA but he went into VF requiring defibrillation. Stent was not done and is rescheduled for end of Oct.  Still with the same cough. Dry cough. Still SOB. Occasional wheezing with cough. Takes Pirolosec BID and Sucralfate QID. Takes flonase, zyrtec, singulair. This time, interpreter  Is via videophone [place].   I have noticed since the first time I saw him, his cough has gotten less (at least during the encounter) but pt denies.  ROV 04/14/16  Patient returns to the office as follow-up on his cough. Since last seen, he ended up getting a stent (cardiac) and he tolerated the procedure well. He has been following with cardiology. His cough also significantly improved with prednisone which he took for 1-2 weeks. He weaned off sucralfate as well as the Flonase. Cough is still significantly improved, not necessarily cause him any issues right now.     He was out of work for 2 months as his cardiac issues were being resolved. He went back to work 5 days ago. Exposed again to propane. No recent infection.                                                                                                                                                                                                                                                                                                                                                                                                   Review of Systems  Constitutional: Negative.  Negative for fever and unexpected weight change.  HENT: Negative for congestion, dental problem, ear pain, nosebleeds, postnasal drip, rhinorrhea, sinus pressure, sneezing, sore throat and trouble swallowing.   Eyes: Negative.  Negative for redness and itching.  Respiratory: Positive for cough, chest tightness and shortness of breath. Negative for wheezing.   Cardiovascular: Negative.  Negative for palpitations and leg swelling.  Gastrointestinal: Negative.  Negative for nausea and vomiting.  Endocrine: Negative.   Genitourinary: Negative.  Negative for dysuria.  Musculoskeletal: Positive for arthralgias. Negative for joint swelling.  Skin: Negative.  Negative for rash.  Allergic/Immunologic: Negative.  Neurological: Positive for headaches.  Hematological: Negative.  Does not bruise/bleed easily.  Psychiatric/Behavioral: Negative.  Negative for dysphoric mood. The patient is not nervous/anxious.        Objective:   Physical Exam   Vitals:  Vitals:   04/14/16 1001  BP: 108/64  Pulse: 95  SpO2: 97%  Weight: 203 lb 6.4 oz (92.3 kg)  Height: 5\' 9"  (1.753 m)    Constitutional/General:  Pleasant, well-nourished, well-developed, not in any distress,  Comfortably seating.  Well kempt.  Body mass index is 30.04 kg/m. Wt Readings from Last 3 Encounters:  04/14/16 203 lb 6.4 oz (92.3 kg)  04/05/16 203 lb (92.1 kg)  03/19/16 204 lb 5.9 oz (92.7 kg)    HEENT: Pupils equal and reactive to light and accommodation. Anicteric sclerae. Normal nasal mucosa.   No oral  lesions,  mouth clear,  oropharynx clear, no postnasal drip. (-) Oral thrush. No dental caries.  Airway - Mallampati class III  Neck: No masses. Midline trachea. No JVD, (-) LAD. (-) bruits  appreciated.  Respiratory/Chest: Grossly normal chest. (-) deformity. (-) Accessory muscle use.  Symmetric expansion. (-) Tenderness on palpation.  Resonant on percussion.  Diminished BS on both lower lung zones. (-) crackles, rhonchi. Occasional wheezing.  (-) egophony  Cardiovascular: Regular rate and  rhythm, heart sounds normal, no murmur or gallops, no peripheral edema  Gastrointestinal:  Normal bowel sounds. Soft, non-tender. No hepatosplenomegaly.  (-) masses.   Musculoskeletal:  Normal muscle tone. Normal gait.   Extremities: Grossly normal. (-) clubbing, cyanosis.  (-) edema  Skin: (-) rash,lesions seen.   Neurological/Psychiatric : alert, oriented to time, place, person. Normal mood and affect           Assessment & Plan:  Cough Cough since 06/2015. Complicated history.  See history.  Bottom line, pt is implicating exposure to propane at work as the cause of his cough. He mentioned about workmans compensation during 12/2015 visit. No mention after that.   Cough is significantly improved. He took prednisone for a week or so in August and that improved the cough. He also ended up getting cardiac stents. He was off work for 2 months because of his cardiac issues, cough significantly better during this time as well. He started back work 5 days ago.  Chest CT scan (12/2015)  Mild scarring in RLL and lingula. (-) COPD/infiltrate PFT 04/07/16 FEV1/FVC 83%, FEV1  3.54 100%. N LV. DLCO 64% Blood test ([facility], 12/2015) (-). IgE was 49.  Abd [place] (01/2016)  Hepatic steatosis  Differentials: 1. Pt implicates exposure to propane while at work as cause of his cough.  He has had chronic cough during work, better with days off. On jan 15th or 16th of 2017, he felt "really very sick" at work with fevers, chills, nausea, vomiting, severe cough.  Has been coughing daily since that time.  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 Patient denies any history of asthma   or COPD or any lung problem preceding the cough. Cough is significantly improved when he was out of work for 2 months because of cardiac issues. 2. UACS. Flonase has been weaned off and cough is still better. On claritin and singulair.  3. GERD. This is my main differential for pt's cough. I think patient has underlying reflux disease causing him to have reflux cough. On PPI BID. Sucralfate has been weaned off.  4. Hypersensitivity reaction/asthma type reaction. Cough significantly better with short course prednisone in 01/2016.  5. Pt has CAD. S/P LHC 8/17 and had stent in 02/2016.   Plan : 1. Currently, he is happy the way the cough is controlled with the current meds. He ultimately improved with a short course of prednisone.  2. We will see how the cough behaves as he recently started back work. He is exposed again to propane as part of his work. We will see if the cough gets worse during work.  He was off work x 2 mos and cough was better.  3. .Continue Prilosec > try to decrease to daily. Sucralfate has been stopped.  4. Rx for UACS. Cont singulair, claritin. Flonase has been stopped.    GERD Try to cut down PPI to daily. Sucralfate has been weaned off. May need GI evaluation if cough worsens again.   Return to clinic after his stent and PFT.    I spent at least 25 minutes with the patient today and more than 50% was spent counseling the patient regarding disease and management and facilitating labs and medications.    I interviewed the patient with the use of an interpreter in the room.     Monica Becton, MD 04/14/2016   1:32 PM Pulmonary and [place] Pager: [phone] Office: [phone], Fax: [phone]

## 2016-04-15 ENCOUNTER — Ambulatory Visit (HOSPITAL_COMMUNITY)
Admission: RE | Admit: 2016-04-15 | Discharge: 2016-04-15 | Disposition: A | Discharge status: Home / Self Care | Payer: BLUE CROSS/BLUE SHIELD | Source: Ambulatory Visit | Attending: Cardiovascular Disease | Admitting: Cardiovascular Disease

## 2016-04-15 DIAGNOSIS — I739 Peripheral vascular disease, unspecified: Secondary | ICD-10-CM | POA: Diagnosis not present

## 2016-04-15 DIAGNOSIS — R0989 Other specified symptoms and signs involving the circulatory and respiratory systems: Secondary | ICD-10-CM | POA: Diagnosis not present

## 2016-04-20 ENCOUNTER — Other Ambulatory Visit: Payer: Self-pay

## 2016-04-20 ENCOUNTER — Ambulatory Visit (HOSPITAL_COMMUNITY): Payer: BLUE CROSS/BLUE SHIELD | Attending: Cardiology

## 2016-04-20 ENCOUNTER — Other Ambulatory Visit: Payer: Self-pay | Admitting: Student

## 2016-04-20 DIAGNOSIS — R072 Precordial pain: Secondary | ICD-10-CM | POA: Insufficient documentation

## 2016-04-25 ENCOUNTER — Encounter: Payer: Self-pay | Admitting: Cardiovascular Disease

## 2016-04-25 ENCOUNTER — Encounter: Payer: Self-pay | Admitting: Internal Medicine

## 2016-04-26 ENCOUNTER — Telehealth: Payer: Self-pay | Admitting: Emergency Medicine

## 2016-04-26 ENCOUNTER — Telehealth: Payer: Self-pay | Admitting: Cardiovascular Disease

## 2016-04-26 NOTE — Telephone Encounter (Signed)
Was he cleared for work by cardiology? Thx

## 2016-04-26 NOTE — Telephone Encounter (Signed)
Pt called and wants to know if you can send a note over to his job at [employer]. He needs the note to say that he has no restrictions for job. Fax # is [phone]. He wants to know if he can get a copy for himself as well and he will come by and pick it up. Please advise thanks.

## 2016-04-26 NOTE — Telephone Encounter (Signed)
Mr.Dollens is calling because he is wanting a letter sent to his job that he has no restrictions , please fax to [phone] also he is wanting  Copy for himself as well.  If you have any questions please call him

## 2016-04-26 NOTE — Telephone Encounter (Signed)
Pt sent e-mail previously and a message was routed to provider.

## 2016-04-27 ENCOUNTER — Telehealth: Payer: Self-pay | Admitting: *Deleted

## 2016-04-27 ENCOUNTER — Encounter: Payer: Self-pay | Admitting: *Deleted

## 2016-04-27 ENCOUNTER — Telehealth: Payer: Self-pay | Admitting: Cardiovascular Disease

## 2016-04-27 NOTE — Telephone Encounter (Signed)
New message      Calling to ask Dr Gwenlyn Found to write a note stating there are no restrictions for pt while on his job.  Call and pt will pick up note

## 2016-04-27 NOTE — Telephone Encounter (Signed)
Returned call to patient.He stated he needed a note to return to work without restrictions.Advised I will send message to Dr.Berry's nurse.

## 2016-04-27 NOTE — Telephone Encounter (Signed)
Caller states pt was seen by a PA at [facility] on 04/26/16. She wants to know if PCP is ok with patient starting Lamisil. Please advise.

## 2016-04-27 NOTE — Telephone Encounter (Signed)
Called patient and let him know the letter had been generated and was released to [place]. He said ok and will check there.

## 2016-04-27 NOTE — Telephone Encounter (Signed)
Patient called about the letter. I informed him of the note. He states he was cleared by cardiology.

## 2016-04-28 NOTE — Telephone Encounter (Signed)
OK 

## 2016-04-28 NOTE — Telephone Encounter (Signed)
Letter is done I would wait on Lamisil for a month at least Thx

## 2016-04-29 ENCOUNTER — Ambulatory Visit (INDEPENDENT_AMBULATORY_CARE_PROVIDER_SITE_OTHER): Payer: BLUE CROSS/BLUE SHIELD | Admitting: Internal Medicine

## 2016-04-29 ENCOUNTER — Other Ambulatory Visit (INDEPENDENT_AMBULATORY_CARE_PROVIDER_SITE_OTHER): Payer: BLUE CROSS/BLUE SHIELD

## 2016-04-29 ENCOUNTER — Ambulatory Visit (INDEPENDENT_AMBULATORY_CARE_PROVIDER_SITE_OTHER)
Admission: RE | Admit: 2016-04-29 | Discharge: 2016-04-29 | Disposition: A | Discharge status: Home / Self Care | Payer: BLUE CROSS/BLUE SHIELD | Source: Ambulatory Visit | Attending: Internal Medicine | Admitting: Internal Medicine

## 2016-04-29 ENCOUNTER — Encounter: Payer: Self-pay | Admitting: Internal Medicine

## 2016-04-29 ENCOUNTER — Telehealth: Payer: Self-pay | Admitting: Internal Medicine

## 2016-04-29 VITALS — BP 118/62 | HR 97 | Wt 205.0 lb

## 2016-04-29 DIAGNOSIS — M25551 Pain in right hip: Secondary | ICD-10-CM

## 2016-04-29 DIAGNOSIS — R202 Paresthesia of skin: Secondary | ICD-10-CM

## 2016-04-29 DIAGNOSIS — Z9861 Coronary angioplasty status: Secondary | ICD-10-CM

## 2016-04-29 DIAGNOSIS — R29898 Other symptoms and signs involving the musculoskeletal system: Secondary | ICD-10-CM

## 2016-04-29 DIAGNOSIS — M25552 Pain in left hip: Secondary | ICD-10-CM | POA: Diagnosis not present

## 2016-04-29 DIAGNOSIS — I2510 Atherosclerotic heart disease of native coronary artery without angina pectoris: Secondary | ICD-10-CM

## 2016-04-29 LAB — HEPATIC FUNCTION PANEL
ALBUMIN: 3.9 g/dL (ref 3.5–5.2)
ALK PHOS: 89 U/L (ref 39–117)
ALT: 27 U/L (ref 0–53)
AST: 17 U/L (ref 0–37)
Bilirubin, Direct: 0.1 mg/dL (ref 0.0–0.3)
Total Bilirubin: 0.3 mg/dL (ref 0.2–1.2)
Total Protein: 7 g/dL (ref 6.0–8.3)

## 2016-04-29 LAB — BASIC METABOLIC PANEL
BUN: 15 mg/dL (ref 6–23)
CHLORIDE: 106 meq/L (ref 96–112)
CO2: 26 mEq/L (ref 19–32)
Calcium: 9.1 mg/dL (ref 8.4–10.5)
Creatinine, Ser: 1.12 mg/dL (ref 0.40–1.50)
GFR: 71.48 mL/min (ref >60.00)
GLUCOSE: 111 mg/dL — AB (ref 70–99)
POTASSIUM: 4.2 meq/L (ref 3.5–5.1)
SODIUM: 139 meq/L (ref 135–145)

## 2016-04-29 LAB — TSH: TSH: 1.56 u[IU]/mL (ref 0.35–4.50)

## 2016-04-29 LAB — VITAMIN B12: VITAMIN B 12: 163 pg/mL — AB (ref 211–911)

## 2016-04-29 LAB — CK: Total CK: 91 U/L (ref 7–232)

## 2016-04-29 LAB — URIC ACID: URIC ACID, SERUM: 7.5 mg/dL (ref 4.0–7.8)

## 2016-04-29 MED ORDER — "SYRINGE/NEEDLE (DISP) 25G X 1"" 3 ML MISC": 3 refills | Status: DC

## 2016-04-29 MED ORDER — MELOXICAM 7.5 MG PO TABS: 7.5000 mg | ORAL_TABLET | Freq: Every day | ORAL | 1 refills | Status: DC

## 2016-04-29 MED ORDER — CYANOCOBALAMIN 1000 MCG/ML IJ SOLN: INTRAMUSCULAR | 11 refills | Status: DC

## 2016-04-29 NOTE — Progress Notes (Signed)
Pre visit review using our clinic review tool, if applicable. No additional management support is needed unless otherwise documented below in the visit note. 

## 2016-04-29 NOTE — Progress Notes (Signed)
Subjective:  Patient ID: [name], male    DOB: July 04, 1955  Age: 60 y.o. MRN: [id number]  CC: Leg Pain (Bilateral x 2-3 months)   HPI [name] presents for B hip pain x3 mo; weak legs; ankle pain  Outpatient Medications Prior to Visit  Medication Sig Dispense Refill  . aspirin EC 81 MG tablet Take 1 tablet (81 mg total) by mouth daily. 30 tablet 0  . atorvastatin (LIPITOR) 10 MG tablet Take 1 tablet (10 mg total) by mouth once a week. 30 tablet 11  . clopidogrel (PLAVIX) 75 MG tablet Take 1 tablet (75 mg total) by mouth daily. 30 tablet 11  . doxazosin (CARDURA) 8 MG tablet TAKE ONE-HALF TABLET BY MOUTH TWICE DAILY (Patient taking differently: Take 4 mg by mouth twice daily) 90 tablet 2  . isosorbide mononitrate (IMDUR) 30 MG 24 hr tablet Take 0.5 tablets (15 mg total) by mouth daily. 45 tablet 3  . loratadine (CLARITIN) 10 MG tablet Take 1 tablet (10 mg total) by mouth daily. 30 tablet 11  . montelukast (SINGULAIR) 10 MG tablet Take 1 tablet (10 mg total) by mouth at bedtime. 30 tablet 5  . pantoprazole (PROTONIX) 40 MG tablet Take 1 tablet (40 mg total) by mouth daily. 30 tablet 12   No facility-administered medications prior to visit.     ROS Review of Systems  Constitutional: Positive for fatigue. Negative for appetite change and unexpected weight change.  HENT: Negative for congestion, nosebleeds, sneezing, sore throat and trouble swallowing.   Eyes: Negative for itching and visual disturbance.  Respiratory: Negative for cough.   Cardiovascular: Negative for chest pain, palpitations and leg swelling.  Gastrointestinal: Negative for abdominal distention, blood in stool, diarrhea and nausea.  Genitourinary: Negative for frequency and hematuria.  Musculoskeletal: Positive for arthralgias, gait problem and myalgias. Negative for back pain, joint swelling and neck pain.  Skin: Negative for color change, pallor and rash.  Neurological: Negative for  dizziness, tremors, speech difficulty and weakness.  Psychiatric/Behavioral: Negative for agitation, dysphoric mood and sleep disturbance. The patient is not nervous/anxious.     Objective:  BP 118/62   Pulse 97   Wt 205 lb (93 kg)   SpO2 96%   BMI 30.27 kg/m   BP Readings from Last 3 Encounters:  04/29/16 118/62  04/14/16 108/64  04/05/16 100/80    Wt Readings from Last 3 Encounters:  04/29/16 205 lb (93 kg)  04/14/16 203 lb 6.4 oz (92.3 kg)  04/05/16 203 lb (92.1 kg)    Physical Exam  Constitutional: He is oriented to person, place, and time. He appears well-developed. No distress.  NAD  HENT:  Mouth/Throat: Oropharynx is clear and moist.  Eyes: Conjunctivae are normal. Pupils are equal, round, and reactive to light.  Neck: Normal range of motion. No JVD present. No thyromegaly present.  Cardiovascular: Normal rate, regular rhythm, normal heart sounds and intact distal pulses.  Exam reveals no gallop and no friction rub.   No murmur heard. Pulmonary/Chest: Effort normal and breath sounds normal. No respiratory distress. He has no wheezes. He has no rales. He exhibits no tenderness.  Abdominal: Soft. Bowel sounds are normal. He exhibits no distension and no mass. There is no tenderness. There is no rebound and no guarding.  Genitourinary: Rectal exam shows guaiac negative stool.  Musculoskeletal: Normal range of motion. He exhibits no edema or tenderness.  Lymphadenopathy:    He has no cervical adenopathy.  Neurological: He is alert and  oriented to person, place, and time. He has normal reflexes. No cranial nerve deficit. He exhibits normal muscle tone. He displays a negative Romberg sign. Coordination and gait normal.  Skin: Skin is warm and dry. No rash noted.  Psychiatric: He has a normal mood and affect. His behavior is normal. Judgment and thought content normal.    Lab Results  Component Value Date   WBC 7.3 03/19/2016   HGB 12.5 (L) 03/19/2016   HCT 38.4 (L)  03/19/2016   PLT 275 03/19/2016   GLUCOSE 108 (H) 03/19/2016   CHOL 176 07/30/2015   TRIG 238.0 (H) 07/30/2015   HDL 41.70 07/30/2015   LDLDIRECT 90.0 07/30/2015   LDLCALC 87 12/30/2014   ALT 33 12/29/2015   AST 18 12/29/2015   NA 136 03/19/2016   K 4.2 03/19/2016   CL 105 03/19/2016   CREATININE 1.19 03/19/2016   BUN 13 03/19/2016   CO2 26 03/19/2016   TSH 0.87 12/29/2015   PSA 0.52 07/30/2015   INR 1.0 03/10/2016   HGBA1C 5.7 07/30/2015    No results found.  Assessment & Plan:   There are no diagnoses linked to this encounter. I am having [name] maintain his loratadine, montelukast, clopidogrel, aspirin EC, doxazosin, pantoprazole, atorvastatin, isosorbide mononitrate, terbinafine, and zolpidem.  Meds ordered this encounter  Medications  . terbinafine (LAMISIL) 250 MG tablet  . zolpidem (AMBIEN) 10 MG tablet     Follow-up: No Follow-up on file.  Walker Kehr, MD

## 2016-04-29 NOTE — Telephone Encounter (Signed)
Pt seen by PCP 04/29/16.

## 2016-04-29 NOTE — Telephone Encounter (Signed)
Pls call pt - he has a new vitamin B12 deficiency that may be responsible for his leg weakness. We need to start him on Vitamin B12 shots. He will need to come for a nurse visit tomorrow - pls call and schedule. Sincerely, Jon Kehr, MD   Vit B12 1000 mcg qd x 4 days, then 1000 mcg weekly x4 then q 2 weeks sq. He can administer shots to himself if he learns how.

## 2016-04-29 NOTE — Assessment & Plan Note (Signed)
x 3 mo ?etiology Hold Lipitor Labs LS X ray

## 2016-04-29 NOTE — Assessment & Plan Note (Signed)
Plavix, ASA 

## 2016-04-29 NOTE — Patient Instructions (Signed)
Start CoQ10

## 2016-04-29 NOTE — Assessment & Plan Note (Signed)
?  statins related vs other Labs Hold Lipitor

## 2016-04-30 ENCOUNTER — Ambulatory Visit (INDEPENDENT_AMBULATORY_CARE_PROVIDER_SITE_OTHER): Payer: BLUE CROSS/BLUE SHIELD

## 2016-04-30 DIAGNOSIS — E538 Deficiency of other specified B group vitamins: Secondary | ICD-10-CM | POA: Diagnosis not present

## 2016-04-30 MED ORDER — CYANOCOBALAMIN 1000 MCG/ML IJ SOLN
1000.0000 ug | Freq: Once | INTRAMUSCULAR | Status: AC
  Administered 2016-04-30: 1000 ug via INTRAMUSCULAR

## 2016-04-30 NOTE — Telephone Encounter (Signed)
Called pt about this. He is coming in for his B12 injection today 11/10 at 3pm. Thanks.

## 2016-04-30 NOTE — Telephone Encounter (Signed)
Will you call pt to come in for a nurse visit for B12 injections. Inform patient he can learn how to do them himselft if he would like at that visit.

## 2016-05-03 ENCOUNTER — Ambulatory Visit (INDEPENDENT_AMBULATORY_CARE_PROVIDER_SITE_OTHER): Payer: BLUE CROSS/BLUE SHIELD

## 2016-05-03 DIAGNOSIS — E538 Deficiency of other specified B group vitamins: Secondary | ICD-10-CM

## 2016-05-03 MED ORDER — CYANOCOBALAMIN 1000 MCG/ML IJ SOLN
1000.0000 ug | Freq: Once | INTRAMUSCULAR | Status: AC
  Administered 2016-05-03: 1000 ug via INTRAMUSCULAR

## 2016-05-04 ENCOUNTER — Ambulatory Visit (INDEPENDENT_AMBULATORY_CARE_PROVIDER_SITE_OTHER): Payer: BLUE CROSS/BLUE SHIELD | Admitting: General Practice

## 2016-05-04 ENCOUNTER — Ambulatory Visit (INDEPENDENT_AMBULATORY_CARE_PROVIDER_SITE_OTHER): Payer: BLUE CROSS/BLUE SHIELD | Admitting: Cardiovascular Disease

## 2016-05-04 ENCOUNTER — Encounter: Payer: Self-pay | Admitting: Cardiovascular Disease

## 2016-05-04 VITALS — BP 120/76 | HR 75 | Ht 69.0 in | Wt 206.0 lb

## 2016-05-04 DIAGNOSIS — I2510 Atherosclerotic heart disease of native coronary artery without angina pectoris: Secondary | ICD-10-CM

## 2016-05-04 DIAGNOSIS — E785 Hyperlipidemia, unspecified: Secondary | ICD-10-CM

## 2016-05-04 DIAGNOSIS — E538 Deficiency of other specified B group vitamins: Secondary | ICD-10-CM | POA: Diagnosis not present

## 2016-05-04 DIAGNOSIS — Z9861 Coronary angioplasty status: Secondary | ICD-10-CM | POA: Diagnosis not present

## 2016-05-04 DIAGNOSIS — Z23 Encounter for immunization: Secondary | ICD-10-CM | POA: Diagnosis not present

## 2016-05-04 MED ORDER — CYANOCOBALAMIN 1000 MCG/ML IJ SOLN
1000.0000 ug | Freq: Once | INTRAMUSCULAR | Status: AC
  Administered 2016-05-04: 1000 ug via INTRAMUSCULAR

## 2016-05-04 NOTE — Assessment & Plan Note (Signed)
History of dyslipidemia on Lipitor until recently. This was discontinued by his PCP, Dr. Alain Marion  for question statin intolerance. We will recheck a lipid and liver profile

## 2016-05-04 NOTE — Progress Notes (Signed)
05/04/2016 [name]   March 09, 1956  [id number]  Primary Physician Jon Kehr, MD Primary Cardiologist: Jon Harp MD [name]  HPI:  [name] a 60 year old single [place] male whose mother is a patient of mine. I last saw him in the office 02/11/16. He has a history of treated hypertension, hyperlipidemia and family history for heart disease. He is complained of chest pain off and on for several years. Somewhat exertional. He did have a stress test done in January that was low risk. He's had a persistent cough over the last 6 months. Recent chest CT performed 01/08/16 showed no significant pulmonary abnormalities but there was a small pericardial effusion and calcium in the LAD territory. He underwent outpatient cardiac catheterization on 02/05/16 B right radial approach revealing a normal left system with moderate hypodense mid dominant RCA stenosis. He developed ventricular fibrillation after the third coronary injection requiring CPR and defibrillation. Because of ongoing chest pain I performed PCI and drug-eluting stenting of his mid RCA uvula right femoral approach 03/10/16. Received was uncomplicated. He continues to have atypical chest pain. His PCP related discontinue the statin drug for presumed "statin intolerance".  Current Outpatient Prescriptions  Medication Sig Dispense Refill  . aspirin EC 81 MG tablet Take 1 tablet (81 mg total) by mouth daily. 30 tablet 0  . atorvastatin (LIPITOR) 10 MG tablet Take 1 tablet (10 mg total) by mouth once a week. 30 tablet 11  . clopidogrel (PLAVIX) 75 MG tablet Take 1 tablet (75 mg total) by mouth daily. 30 tablet 11  . cyanocobalamin (COBAL-1000) 1000 MCG/ML injection Vit B12 1000 mcg qd x 4 days, then 1000 mcg weekly x4 then q 2 weeks sq. 10 mL 11  . doxazosin (CARDURA) 8 MG tablet TAKE ONE-HALF TABLET BY MOUTH TWICE DAILY (Patient taking differently: Take 4 mg by mouth twice daily) 90 tablet 2  . isosorbide  mononitrate (IMDUR) 30 MG 24 hr tablet Take 0.5 tablets (15 mg total) by mouth daily. 45 tablet 3  . loratadine (CLARITIN) 10 MG tablet Take 1 tablet (10 mg total) by mouth daily. 30 tablet 11  . meloxicam (MOBIC) 7.5 MG tablet Take 1 tablet (7.5 mg total) by mouth daily. 15 tablet 1  . montelukast (SINGULAIR) 10 MG tablet Take 1 tablet (10 mg total) by mouth at bedtime. 30 tablet 5  . pantoprazole (PROTONIX) 40 MG tablet Take 1 tablet (40 mg total) by mouth daily. 30 tablet 12  . SYRINGE-NEEDLE, DISP, 3 ML (BD ECLIPSE SYRINGE) 25G X 1" 3 ML MISC Vit B12 1000 mcg qd x 4 days, then 1000 mcg weekly x4 then q 2 weeks sq. 50 each 3  . terbinafine (LAMISIL) 250 MG tablet     . zolpidem (AMBIEN) 10 MG tablet      No current facility-administered medications for this visit.     Allergies  Allergen Reactions  . Flomax [Tamsulosin Hcl] Other (See Comments)    Hard time breathing  . Atorvastatin Other (See Comments)    REACTION: leg cramps per patient with a large dose    Social History   Social History  . Marital status: Divorced    Spouse name: N/A  . Number of children: 1  . Years of education: N/A   Occupational History  . Animal nutritionist    Social History Main Topics  . Smoking status: Never Smoker  . Smokeless tobacco: Never Used  . Alcohol use No     Comment:  rare  . Drug use: No  . Sexual activity: No   Other Topics Concern  . Not on file   Social History Narrative   Coffee daily      Review of Systems: General: negative for chills, fever, night sweats or weight changes.  Cardiovascular: negative for chest pain, dyspnea on exertion, edema, orthopnea, palpitations, paroxysmal nocturnal dyspnea or shortness of breath Dermatological: negative for rash Respiratory: negative for cough or wheezing Urologic: negative for hematuria Abdominal: negative for nausea, vomiting, diarrhea, bright red blood per rectum, melena, or hematemesis Neurologic: negative for visual  changes, syncope, or dizziness All other systems reviewed and are otherwise negative except as noted above.    Blood pressure 120/76, pulse 75, height 5\' 9"  (1.753 m), weight 206 lb (93.4 kg).  General appearance: alert and no distress Neck: no adenopathy, no carotid bruit, no JVD, supple, symmetrical, trachea midline and thyroid not enlarged, symmetric, no tenderness/mass/nodules Lungs: clear to auscultation bilaterally Heart: regular rate and rhythm, S1, S2 normal, no murmur, click, rub or gallop Extremities: extremities normal, atraumatic, no cyanosis or edema  EKG normal sinus rhythm at 75 without ST or T-wave changes. I personally reviewed this EKG  ASSESSMENT AND PLAN:   Dyslipidemia History of dyslipidemia on Lipitor until recently. This was discontinued by his PCP, Dr. Alain Benson  for question statin intolerance. We will recheck a lipid and liver profile  S/P PTCA (percutaneous transluminal coronary angioplasty) History of CAD status post RCA PCI and drug-eluting stenting by myself 03/18/16. His left coronary system was free of disease and he had normal LV function. He is on dual antiplatelet therapy and continues to have atypical chest pain.      Jon Harp MD FACP,FACC,FAHA, [facility] 05/04/2016 2:40 PM

## 2016-05-04 NOTE — Assessment & Plan Note (Signed)
History of CAD status post RCA PCI and drug-eluting stenting by myself 03/18/16. His left coronary system was free of disease and he had normal LV function. He is on dual antiplatelet therapy and continues to have atypical chest pain.

## 2016-05-04 NOTE — Patient Instructions (Signed)
Medication Instructions: Your physician recommends that you continue on your current medications as directed. Please refer to the Current Medication list given to you today.   Labwork: Your physician recommends that you return for a FASTING lipid panel and hepatic function panel in 1 month.  Follow-Up: Your physician wants you to follow-up in: 6 months with Dr. Gwenlyn Found. You will receive a reminder letter in the mail two months in advance. If you don't receive a letter, please call our office to schedule the follow-up appointment.  If you need a refill on your cardiac medications before your next appointment, please call your pharmacy.

## 2016-05-05 ENCOUNTER — Encounter: Payer: Self-pay | Admitting: Internal Medicine

## 2016-05-05 ENCOUNTER — Ambulatory Visit (INDEPENDENT_AMBULATORY_CARE_PROVIDER_SITE_OTHER): Payer: BLUE CROSS/BLUE SHIELD

## 2016-05-05 DIAGNOSIS — E538 Deficiency of other specified B group vitamins: Secondary | ICD-10-CM | POA: Diagnosis not present

## 2016-05-05 MED ORDER — CYANOCOBALAMIN 1000 MCG/ML IJ SOLN
1000.0000 ug | Freq: Once | INTRAMUSCULAR | Status: AC
  Administered 2016-05-05: 1000 ug via INTRAMUSCULAR

## 2016-05-10 ENCOUNTER — Ambulatory Visit (INDEPENDENT_AMBULATORY_CARE_PROVIDER_SITE_OTHER): Payer: BLUE CROSS/BLUE SHIELD | Admitting: Emergency Medicine

## 2016-05-10 DIAGNOSIS — E538 Deficiency of other specified B group vitamins: Secondary | ICD-10-CM | POA: Diagnosis not present

## 2016-05-10 MED ORDER — CYANOCOBALAMIN 1000 MCG/ML IJ SOLN
1000.0000 ug | Freq: Once | INTRAMUSCULAR | Status: AC
  Administered 2016-05-10: 1000 ug via INTRAMUSCULAR

## 2016-05-17 ENCOUNTER — Ambulatory Visit: Payer: BLUE CROSS/BLUE SHIELD

## 2016-05-18 ENCOUNTER — Telehealth: Payer: Self-pay | Admitting: Cardiovascular Disease

## 2016-05-18 ENCOUNTER — Ambulatory Visit (INDEPENDENT_AMBULATORY_CARE_PROVIDER_SITE_OTHER): Payer: BLUE CROSS/BLUE SHIELD | Admitting: General Practice

## 2016-05-18 DIAGNOSIS — E538 Deficiency of other specified B group vitamins: Secondary | ICD-10-CM | POA: Diagnosis not present

## 2016-05-18 MED ORDER — CYANOCOBALAMIN 1000 MCG/ML IJ SOLN
1000.0000 ug | Freq: Once | INTRAMUSCULAR | Status: AC
Start: 2016-05-18 — End: 2016-05-18
  Administered 2016-05-18: 1000 ug via INTRAMUSCULAR

## 2016-05-18 NOTE — Telephone Encounter (Signed)
First spoke with pt's mother she was very concerned that pt's BP yesterday was 180/72 HR 78 this morning BP was 147/89 HR 69. She states that pt has been having intermittent CP a few times last week and yesterday, not now(per pt it lasts <27min) speaking with pt he states that he is not concerned because after he took his isosorbide 15mg  (1/2 tablet) BP went down to 96/62 HR 91 pt declines appt today and would like to access BP for a few more days and will call back if CP or BP gets high again he states that he feels confident that his BP will "be fine" pt states he will call back if he thinks he should be seen, declines at this point.

## 2016-05-18 NOTE — Telephone Encounter (Signed)
New message  Concerned about BP  150/90, 147/98, 103/72  Heart rate 49  Sometimes pain in chest  plavix 75mg   9/28-stint  (pt's wife has thick accent)

## 2016-05-24 ENCOUNTER — Ambulatory Visit (INDEPENDENT_AMBULATORY_CARE_PROVIDER_SITE_OTHER): Payer: BLUE CROSS/BLUE SHIELD

## 2016-05-24 DIAGNOSIS — E538 Deficiency of other specified B group vitamins: Secondary | ICD-10-CM

## 2016-05-24 MED ORDER — CYANOCOBALAMIN 1000 MCG/ML IJ SOLN
1000.0000 ug | Freq: Once | INTRAMUSCULAR | Status: AC
  Administered 2016-05-24: 1000 ug via INTRAMUSCULAR

## 2016-05-28 ENCOUNTER — Telehealth: Payer: Self-pay | Admitting: Pulmonary Disease

## 2016-05-28 ENCOUNTER — Ambulatory Visit: Payer: BLUE CROSS/BLUE SHIELD | Admitting: Pulmonary Disease

## 2016-05-28 ENCOUNTER — Encounter: Payer: Self-pay | Admitting: Adult Health

## 2016-05-28 ENCOUNTER — Ambulatory Visit (INDEPENDENT_AMBULATORY_CARE_PROVIDER_SITE_OTHER): Payer: BLUE CROSS/BLUE SHIELD | Admitting: Adult Health

## 2016-05-28 ENCOUNTER — Ambulatory Visit (INDEPENDENT_AMBULATORY_CARE_PROVIDER_SITE_OTHER)
Admission: RE | Admit: 2016-05-28 | Discharge: 2016-05-28 | Disposition: A | Discharge status: Home / Self Care | Payer: BLUE CROSS/BLUE SHIELD | Source: Ambulatory Visit | Attending: Adult Health | Admitting: Adult Health

## 2016-05-28 VITALS — BP 122/78 | HR 82 | Temp 97.1°F | Ht 69.0 in | Wt 205.6 lb

## 2016-05-28 DIAGNOSIS — J45991 Cough variant asthma: Secondary | ICD-10-CM | POA: Diagnosis not present

## 2016-05-28 DIAGNOSIS — R0600 Dyspnea, unspecified: Secondary | ICD-10-CM

## 2016-05-28 DIAGNOSIS — R0609 Other forms of dyspnea: Secondary | ICD-10-CM

## 2016-05-28 DIAGNOSIS — R05 Cough: Secondary | ICD-10-CM

## 2016-05-28 DIAGNOSIS — R059 Cough, unspecified: Secondary | ICD-10-CM

## 2016-05-28 MED ORDER — FLUTICASONE FUROATE-VILANTEROL 100-25 MCG/INH IN AEPB: 1.0000 | INHALATION_SPRAY | Freq: Every day | RESPIRATORY_TRACT | 5 refills | Status: DC

## 2016-05-28 MED ORDER — FLUTICASONE FUROATE-VILANTEROL 100-25 MCG/INH IN AEPB: 1.0000 | INHALATION_SPRAY | Freq: Every day | RESPIRATORY_TRACT | 0 refills | Status: DC

## 2016-05-28 NOTE — Telephone Encounter (Signed)
LM x 1 

## 2016-05-28 NOTE — Patient Instructions (Addendum)
Chest xray today   Begin BREO 1 puff daily  follow up Dr. Corrie Dandy in 4 weeks and As needed

## 2016-05-28 NOTE — Progress Notes (Signed)
Subjective:    Patient ID: [name], male    DOB: 05-Dec-1955, 60 y.o.   MRN: [id number]  HPI 60 yo male non smoker (from [place] , in [place] since 2000) seen for pulmonary consult 11/28/15 for a  cough for 6 months .   TEST  Chest CT scan (12/2015)  Mild scarring in RLL and lingula. (-) COPD/infiltrate PFT (12/2015)  Mild COPD BUT poor technique 2/2 coughing. FEV1  3.45  97% Blood test ([facility], 12/2015) (-). IgE was 49.  Abd [place] (01/2016)  Hepatic steatosis  05/28/2016 Follow up : Cough (exam done with interpretor )  Pt returns for 2 month follow up for cough . He was initially treated for GERD and AB w/ PPI and Symbiocrt. Without much improvement. He returned last office visit. Started on Steroid taper along with PPI. He says his cough has resolved totally.  He says he has no dyspnea at all. Since last ov he underwent stent -s/p RCA PCI/stent .  Pt says he continues to get chest tightness that comes and goes. He can not relate any particular action that aggravates this or triggers this.  Cough is resolved. He does not think he is wheezing .     Past Medical History:  Diagnosis Date  . Atrial fibrillation ([place])   . CAD (coronary artery disease)    a.cath 01/2016: 60% stenosis RCA. Went into VF during procedure requiring CPR and defibrillation. No intervention performed at that time b.cath 02/2016: 3 mm x 15 mm Xience DES to prox-RCA   . Coronary artery disease   . GERD (gastroesophageal reflux disease)   . Headache(784.0)   . Hyperlipidemia   . Kidney stones    "passed it" (03/18/2016)  . URI (upper respiratory infection)    Current Outpatient Prescriptions on File Prior to Visit  Medication Sig Dispense Refill  . aspirin EC 81 MG tablet Take 1 tablet (81 mg total) by mouth daily. 30 tablet 0  . atorvastatin (LIPITOR) 10 MG tablet Take 1 tablet (10 mg total) by mouth once a week. 30 tablet 11  . clopidogrel (PLAVIX) 75 MG tablet Take 1 tablet (75 mg total) by mouth daily. 30  tablet 11  . cyanocobalamin (COBAL-1000) 1000 MCG/ML injection Vit B12 1000 mcg qd x 4 days, then 1000 mcg weekly x4 then q 2 weeks sq. 10 mL 11  . doxazosin (CARDURA) 8 MG tablet TAKE ONE-HALF TABLET BY MOUTH TWICE DAILY (Patient taking differently: Take 4 mg by mouth twice daily) 90 tablet 2  . isosorbide mononitrate (IMDUR) 30 MG 24 hr tablet Take 0.5 tablets (15 mg total) by mouth daily. 45 tablet 3  . meloxicam (MOBIC) 7.5 MG tablet Take 1 tablet (7.5 mg total) by mouth daily. 15 tablet 1  . SYRINGE-NEEDLE, DISP, 3 ML (BD ECLIPSE SYRINGE) 25G X 1" 3 ML MISC Vit B12 1000 mcg qd x 4 days, then 1000 mcg weekly x4 then q 2 weeks sq. 50 each 3  . loratadine (CLARITIN) 10 MG tablet Take 1 tablet (10 mg total) by mouth daily. (Patient not taking: Reported on 05/28/2016) 30 tablet 11  . montelukast (SINGULAIR) 10 MG tablet Take 1 tablet (10 mg total) by mouth at bedtime. (Patient not taking: Reported on 05/28/2016) 30 tablet 5  . pantoprazole (PROTONIX) 40 MG tablet Take 1 tablet (40 mg total) by mouth daily. (Patient not taking: Reported on 05/28/2016) 30 tablet 12  . terbinafine (LAMISIL) 250 MG tablet     . zolpidem (  AMBIEN) 10 MG tablet      No current facility-administered medications on file prior to visit.      Review of Systems Constitutional:   No  weight loss, night sweats,  Fevers, chills, fatigue, or  lassitude.  HEENT:   No headaches,  Difficulty swallowing,  Tooth/dental problems, or  Sore throat,                No sneezing, itching, ear ache, nasal congestion, post nasal drip,   CV:  No chest pain,  Orthopnea, PND, swelling in lower extremities, anasarca, dizziness, palpitations, syncope.   GI  No heartburn, indigestion, abdominal pain, nausea, vomiting, diarrhea, change in bowel habits, loss of appetite, bloody stools.   Resp: No shortness of breath with exertion or at rest.  No excess mucus, no productive cough,  No non-productive cough,  No coughing up of blood.  No change in  color of mucus.  No wheezing.  No chest wall deformity  Skin: no rash or lesions.  GU: no dysuria, change in color of urine, no urgency or frequency.  No flank pain, no hematuria   MS:  No joint pain or swelling.  No decreased range of motion.  No back pain.  Psych:  No change in mood or affect. No depression or anxiety.  No memory loss.         Objective:   Physical Exam  Vitals:   05/28/16 1420  BP: 122/78  Pulse: 82  Temp: 97.1 F (36.2 C)  TempSrc: Oral  SpO2: 95%  Weight: 205 lb 9.6 oz (93.3 kg)  Height: 5\' 9"  (1.753 m)   GEN: A/Ox3; pleasant , NAD, well nourished    HEENT:  [place]/AT,  EACs-clear, TMs-wnl, NOSE-clear, THROAT-clear, no lesions, no postnasal drip or exudate noted.   NECK:  Supple w/ fair ROM; no JVD; normal carotid impulses w/o bruits; no thyromegaly or nodules palpated; no lymphadenopathy.    RESP  Clear  P & A; w/o, wheezes/ rales/ or rhonchi. no accessory muscle use, no dullness to percussion  CARD:  RRR, no m/r/g  , no peripheral edema, pulses intact, no cyanosis or clubbing.  GI:   Soft & nt; nml bowel sounds; no organomegaly or masses detected.   Musco: Warm bil, no deformities or joint swelling noted.   Neuro: alert, no focal deficits noted.    Skin: Warm, no lesions or rashes  Loney Domingo NP-C  [facility]  05/28/2016       Assessment & Plan:

## 2016-05-31 ENCOUNTER — Ambulatory Visit (INDEPENDENT_AMBULATORY_CARE_PROVIDER_SITE_OTHER): Payer: BLUE CROSS/BLUE SHIELD

## 2016-05-31 DIAGNOSIS — E538 Deficiency of other specified B group vitamins: Secondary | ICD-10-CM

## 2016-05-31 MED ORDER — CYANOCOBALAMIN 1000 MCG/ML IJ SOLN
1000.0000 ug | Freq: Once | INTRAMUSCULAR | Status: AC
  Administered 2016-05-31: 1000 ug via INTRAMUSCULAR

## 2016-05-31 NOTE — Telephone Encounter (Signed)
TP, AD is completely booked at time of requested return OV. Next opening is 07/26/16. Are you okay with patient waiting until then to be seen. Thanks.

## 2016-05-31 NOTE — Telephone Encounter (Signed)
Pt has been scheduled to see AD on 07/26/16 at 2:15pm. Pt is aware of appt date and time. Nothing further needed at this time.

## 2016-05-31 NOTE — Telephone Encounter (Signed)
I guess if there are not other options  Please contact office for sooner follow up if symptoms do not improve or worsen or seek emergency care

## 2016-06-03 NOTE — Assessment & Plan Note (Signed)
Resolved with GERD tx and steroid   Plan  Cont current regimen

## 2016-06-03 NOTE — Assessment & Plan Note (Signed)
Cough has resolved however chest tightness persists despite recent cardiac stent.  Will try BREO however previous symbicort did not perceive as beneficial . Thought is now that his cough is better that might be able to better evaluate if there is an asthmatic component.   Plan  Patient Instructions  Chest xray today   Begin BREO 1 puff daily  follow up Dr. Corrie Dandy in 4 weeks and As needed

## 2016-06-08 NOTE — Progress Notes (Signed)
Patient returned call.  Advised of cxr results / recs as stated by TP.  Pt verbalized understanding and denied any questions. 

## 2016-06-09 LAB — HEPATIC FUNCTION PANEL
ALT: 28 U/L (ref 9–46)
AST: 21 U/L (ref 10–35)
Albumin: 4 g/dL (ref 3.6–5.1)
Alkaline Phosphatase: 74 U/L (ref 40–115)
Bilirubin, Direct: 0.1 mg/dL
Indirect Bilirubin: 0.5 mg/dL (ref 0.2–1.2)
Total Bilirubin: 0.6 mg/dL (ref 0.2–1.2)
Total Protein: 6.7 g/dL (ref 6.1–8.1)

## 2016-06-09 LAB — LIPID PANEL
CHOLESTEROL: 162 mg/dL (ref <200)
HDL: 49 mg/dL (ref >40)
LDL Cholesterol: 90 mg/dL (ref <100)
TRIGLYCERIDES: 117 mg/dL (ref <150)
Total CHOL/HDL Ratio: 3.3 Ratio (ref <5.0)
VLDL: 23 mg/dL (ref <30)

## 2016-06-10 ENCOUNTER — Telehealth: Payer: Self-pay | Admitting: Pulmonary Disease

## 2016-06-10 ENCOUNTER — Other Ambulatory Visit: Payer: Self-pay | Admitting: *Deleted

## 2016-06-10 NOTE — Telephone Encounter (Signed)
Received PA from cover my meds to initiate PA for the BREO---this medication is not covered and they prefer   Symbicort or dulera.  AD please advise. Thanks

## 2016-06-15 ENCOUNTER — Ambulatory Visit (INDEPENDENT_AMBULATORY_CARE_PROVIDER_SITE_OTHER): Payer: BLUE CROSS/BLUE SHIELD

## 2016-06-15 DIAGNOSIS — E538 Deficiency of other specified B group vitamins: Secondary | ICD-10-CM

## 2016-06-15 MED ORDER — CYANOCOBALAMIN 1000 MCG/ML IJ SOLN
1000.0000 ug | Freq: Once | INTRAMUSCULAR | Status: AC
  Administered 2016-06-15: 1000 ug via INTRAMUSCULAR

## 2016-06-16 NOTE — Progress Notes (Signed)
Medical treatment/procedure(s) were performed by non-physician practitioner and as supervising physician I was immediately available for consultation/collaboration. I agree with above. Elizabeth A Crawford, MD  

## 2016-06-17 NOTE — Telephone Encounter (Signed)
Try Symbicort 160/4.5 2P BID.    Thanks.   Monica Becton, MD 06/17/2016, 12:46 AM [facility] Pager [phone] After 3 pm or if no answer, call [phone]

## 2016-06-17 NOTE — Telephone Encounter (Signed)
Spoke with pt and made him aware of below message. Pt states he is not currently using any inhalers. Pt states he would like to hold off on this for now.  Will route to AD for FYI.

## 2016-06-24 ENCOUNTER — Telehealth: Payer: Self-pay | Admitting: Cardiovascular Disease

## 2016-06-24 NOTE — Telephone Encounter (Signed)
[name] is calling because [name] had some AFIB on yesterday and it lasted for about 30 mins . And the patient had no medication and is wanting to know what medication can he get or if Dr. Gwenlyn Found can prescribe for him . Please call Dellis Anes  (language resource) at [phone]

## 2016-06-24 NOTE — Telephone Encounter (Signed)
Returned call to translator Dellis Anes.She stated patient had episode of irregular heart beat last night.He would like to be seen,not having irregular heart beat today.Stated patient recently started a new job working 10 hrs every day,he is off on Fridays.Appointment scheduled with Almyra Deforest PA 06/25/16 at 11:30 am.

## 2016-06-25 ENCOUNTER — Telehealth: Payer: Self-pay | Admitting: *Deleted

## 2016-06-25 ENCOUNTER — Ambulatory Visit (INDEPENDENT_AMBULATORY_CARE_PROVIDER_SITE_OTHER): Payer: BLUE CROSS/BLUE SHIELD | Admitting: *Deleted

## 2016-06-25 ENCOUNTER — Other Ambulatory Visit: Payer: Self-pay | Admitting: *Deleted

## 2016-06-25 ENCOUNTER — Ambulatory Visit: Payer: BLUE CROSS/BLUE SHIELD | Admitting: Physician Assistant

## 2016-06-25 DIAGNOSIS — E538 Deficiency of other specified B group vitamins: Secondary | ICD-10-CM

## 2016-06-25 DIAGNOSIS — I4891 Unspecified atrial fibrillation: Secondary | ICD-10-CM

## 2016-06-25 MED ORDER — FLECAINIDE ACETATE 100 MG PO TABS: 100.0000 mg | ORAL_TABLET | ORAL | 3 refills | Status: DC | PRN

## 2016-06-25 MED ORDER — CYANOCOBALAMIN 1000 MCG/ML IJ SOLN
1000.0000 ug | Freq: Once | INTRAMUSCULAR | Status: AC
  Administered 2016-06-25: 1000 ug via INTRAMUSCULAR

## 2016-06-25 NOTE — Telephone Encounter (Signed)
SEE OTHER NOTE PRESCRIPTION E-SENT.  PATIENT UNABLE TO KEEP APPOINTMENT DUE JOB CHANGE. REFILLED FLECAINIDE  ,ORIGINAL PRESCRIPTION EXPIRED IN 8/ 2017.

## 2016-06-25 NOTE — Progress Notes (Unsigned)
SPOKE WITH PATIENT AND TRANSLATOR--PATIENT HAD PRESCRIPTION WHICH EXPIRED. REFILLED MEDICATION X 3. PATIENT UNABLE TO KEEP APPOINTMENT FOR TODAY BECAUSE PATIENT HAS NEW JOB,AND WOULD LIKE TO RESCHEDULE WHEN HE HAS  INSURANCE . E-SENT RX TO PHARMACY PATIENT AWARE.

## 2016-06-25 NOTE — Progress Notes (Deleted)
Cardiology Office Note    Date:  06/25/2016   ID:  [name], DOB [birth year], MRN [id number]  PCP:  Walker Kehr, MD  Cardiologist:  Dr. Gwenlyn Found   No chief complaint on file.   History of Present Illness:  [name] is a 61 y.o. male with PMH of HTN, HLD, PAF and family history of heart disease. He was admitted on 12/29/2015 for palpitation, EKG at the time showed wide complex tachycardia with RVR initially felt to be SVT. However rhythm broke spontaneously and he went back to normal sinus rhythm with bradycardia. He had a negative Myoview previously in 07/2013, he was evaluated by Dr. Lovena Le who felt he had atrial fibrillation with aberrancy. Given his low CHA2DS2-Vasc score, he was placed on baby aspirin only. He was not placed on beta blocker due to bradycardia. He has been having chest pain on and off for several years, somewhat exertional. He did have a stress test done January 2017 that was low risk. Due to persistent cough for six-month, wheezes CT performed his 7/27 showed no significant pulmonary abnormality, there was a small pericardial effusion and the calcium in the LAD territory. He underwent outpatient cardiac catheterization on 02/05/2016 via right radial approach revealing a normal left system with moderate hypokinesis mid dominant RCA stenosis. He developed a ventricular fibrillation after the third coronary ejection requiring CPR and defibrillation. Because of ongoing chest pain, she he did undergo PCI and drug-eluting stent placement to his RCA via right femoral approach on 03/10/2016. Since then, his PCP has discontinued to statin medication for presumed to statin intolerance. He also continued to have atypical chest pain as well. He was last seen on 05/04/2016, 6 month follow-up was recommended.  Patient called yesterday due to concern of atrial fibrillation. He was subsequently placed him on my schedule to be seen.  YES EKG   Past Medical History:    Diagnosis Date  . Atrial fibrillation ([place])   . CAD (coronary artery disease)    a.cath 01/2016: 60% stenosis RCA. Went into VF during procedure requiring CPR and defibrillation. No intervention performed at that time b.cath 02/2016: 3 mm x 15 mm Xience DES to prox-RCA   . Coronary artery disease   . GERD (gastroesophageal reflux disease)   . Headache(784.0)   . Hyperlipidemia   . Kidney stones    "passed it" (03/18/2016)  . URI (upper respiratory infection)     Past Surgical History:  Procedure Laterality Date  . CARDIAC CATHETERIZATION N/A 02/05/2016   Procedure: Left Heart Cath and Coronary Angiography;  Surgeon: Lorretta Harp, MD;  Location: [facility];  Service: Cardiovascular;  Laterality: N/A;  . CARDIAC CATHETERIZATION N/A 03/18/2016   Procedure: Coronary Stent Intervention;  Surgeon: Lorretta Harp, MD;  Location: [facility];  Service: Cardiovascular;  Laterality: N/A;  RCA  . CORONARY ANGIOPLASTY    . INGUINAL HERNIA REPAIR Bilateral   . TONSILLECTOMY      Current Medications: Outpatient Medications Prior to Visit  Medication Sig Dispense Refill  . aspirin EC 81 MG tablet Take 1 tablet (81 mg total) by mouth daily. 30 tablet 0  . atorvastatin (LIPITOR) 10 MG tablet Take 1 tablet (10 mg total) by mouth once a week. 30 tablet 11  . clopidogrel (PLAVIX) 75 MG tablet Take 1 tablet (75 mg total) by mouth daily. 30 tablet 11  . cyanocobalamin (COBAL-1000) 1000 MCG/ML injection Vit B12 1000 mcg qd x 4 days, then 1000  mcg weekly x4 then q 2 weeks sq. 10 mL 11  . doxazosin (CARDURA) 8 MG tablet TAKE ONE-HALF TABLET BY MOUTH TWICE DAILY (Patient taking differently: Take 4 mg by mouth twice daily) 90 tablet 2  . fluticasone furoate-vilanterol (BREO ELLIPTA) 100-25 MCG/INH AEPB Inhale 1 puff into the lungs daily. 28 each 5  . isosorbide mononitrate (IMDUR) 30 MG 24 hr tablet Take 0.5 tablets (15 mg total) by mouth daily. 45 tablet 3  . loratadine (CLARITIN) 10 MG  tablet Take 1 tablet (10 mg total) by mouth daily. (Patient not taking: Reported on 05/28/2016) 30 tablet 11  . meloxicam (MOBIC) 7.5 MG tablet Take 1 tablet (7.5 mg total) by mouth daily. 15 tablet 1  . montelukast (SINGULAIR) 10 MG tablet Take 1 tablet (10 mg total) by mouth at bedtime. (Patient not taking: Reported on 05/28/2016) 30 tablet 5  . pantoprazole (PROTONIX) 40 MG tablet Take 1 tablet (40 mg total) by mouth daily. (Patient not taking: Reported on 05/28/2016) 30 tablet 12  . SYRINGE-NEEDLE, DISP, 3 ML (BD ECLIPSE SYRINGE) 25G X 1" 3 ML MISC Vit B12 1000 mcg qd x 4 days, then 1000 mcg weekly x4 then q 2 weeks sq. 50 each 3  . terbinafine (LAMISIL) 250 MG tablet     . zolpidem (AMBIEN) 10 MG tablet      No facility-administered medications prior to visit.      Allergies:   Flomax [tamsulosin hcl] and Atorvastatin   Social History   Social History  . Marital status: Divorced    Spouse name: N/A  . Number of children: 1  . Years of education: N/A   Occupational History  . Animal nutritionist    Social History Main Topics  . Smoking status: Never Smoker  . Smokeless tobacco: Never Used  . Alcohol use No     Comment: rare  . Drug use: No  . Sexual activity: No   Other Topics Concern  . Not on file   Social History Narrative   Coffee daily      Family History:  The patient's ***family history includes Coronary artery disease in his father and mother; Diabetes in his father; Heart attack in his maternal grandfather and mother; Heart disease in his father and mother; Hypertension in his mother; Kidney disease in his mother; Liver disease in his mother.   ROS:   Please see the history of present illness.    ROS All other systems reviewed and are negative.   PHYSICAL EXAM:   VS:  There were no vitals taken for this visit.   GEN: Well nourished, well developed, in no acute distress  HEENT: normal  Neck: no JVD, carotid bruits, or masses Cardiac: ***RRR; no murmurs, rubs,  or gallops,no edema  Respiratory:  clear to auscultation bilaterally, normal work of breathing GI: soft, nontender, nondistended, + BS MS: no deformity or atrophy  Skin: warm and dry, no rash Neuro:  Alert and Oriented x 3, Strength and sensation are intact Psych: euthymic mood, full affect  Wt Readings from Last 3 Encounters:  05/28/16 205 lb 9.6 oz (93.3 kg)  05/04/16 206 lb (93.4 kg)  04/29/16 205 lb (93 kg)      Studies/Labs Reviewed:   EKG:  EKG is*** ordered today.  The ekg ordered today demonstrates ***  Recent Labs: 03/19/2016: Hemoglobin 12.5; Platelets 275 04/29/2016: BUN 15; Creatinine, Ser 1.12; Potassium 4.2; Sodium 139; TSH 1.56 06/09/2016: ALT 28   Lipid Panel    Component  Value Date/Time   CHOL 162 06/09/2016 0815   TRIG 117 06/09/2016 0815   HDL 49 06/09/2016 0815   CHOLHDL 3.3 06/09/2016 0815   VLDL 23 06/09/2016 0815   LDLCALC 90 06/09/2016 0815   LDLDIRECT 90.0 07/30/2015 1404    Additional studies/ records that were reviewed today include:  ***    ASSESSMENT:    No diagnosis found.   PLAN:  In order of problems listed above:  1. ***    Medication Adjustments/Labs and Tests Ordered: Current medicines are reviewed at length with the patient today.  Concerns regarding medicines are outlined above.  Medication changes, Labs and Tests ordered today are listed in the Patient Instructions below. There are no Patient Instructions on file for this visit.   Hilbert Corrigan, [place]  06/25/2016 6:46 AM    [address], [place], [place]  [address] Phone: [phone]; Fax: [phone]

## 2016-06-28 ENCOUNTER — Ambulatory Visit: Payer: BLUE CROSS/BLUE SHIELD

## 2016-07-12 ENCOUNTER — Ambulatory Visit (INDEPENDENT_AMBULATORY_CARE_PROVIDER_SITE_OTHER): Payer: BLUE CROSS/BLUE SHIELD

## 2016-07-12 DIAGNOSIS — E538 Deficiency of other specified B group vitamins: Secondary | ICD-10-CM | POA: Diagnosis not present

## 2016-07-12 MED ORDER — CYANOCOBALAMIN 1000 MCG/ML IJ SOLN
1000.0000 ug | Freq: Once | INTRAMUSCULAR | Status: AC
  Administered 2016-07-12: 1000 ug via INTRAMUSCULAR

## 2016-07-20 ENCOUNTER — Telehealth: Payer: Self-pay | Admitting: Cardiovascular Disease

## 2016-07-20 NOTE — Telephone Encounter (Signed)
New Message  Pt c/o BP issue: STAT if pt c/o blurred vision, one-sided weakness or slurred speech  1. What are your last 5 BP readings? 156/85 pulse 85, 87/44 pulse 90, then 87/44 pulse 72  2. Are you having any other symptoms (ex. Dizziness, headache, blurred vision, passed out)? Dizziness, Headache, blurred vision.  3. What is your BP issue? Pts wife voiced needing advice on what she needs to do.

## 2016-07-20 NOTE — Telephone Encounter (Signed)
Arrange for him to be seen by a mid level in the next 2 weeks

## 2016-07-20 NOTE — Telephone Encounter (Signed)
Pt notified he states that he declines to make an appt at this time, he will call back and make an appt and if he need to go to the ER states he has bad insurance and will have different insurance in 2-3 months.Denies symptoms other than dizziness.  Any suggestions until then?

## 2016-07-20 NOTE — Telephone Encounter (Signed)
Pt called to report BP issues, had his mother speak to me to get advice. Note the patient has [place] as listed language, he and mother state English fluency & declined use of interpreter services. Abundio Miu (mother) reports pt had a BP reading of 156/85 w HR 85 yesterday evening, woke this AM and had hypotensive reading, 87/44 HR 90 1st thing AM, then 85/52 w HR 72 shortly before they called.  Denies symptoms other than dizziness.  Wants to know what to do to address BP before patient goes into work today at [employer] pt appt, he declined interest in being seen in office.  Pt takes cardura (4mg  daily), isosorbide (15mg  daily). Med dosing decreased in past due to his BP concerns.  Pt skipped breakfast went back to bed this AM after 1st low reading. Mother gave him "a piece of chocolate and some water". I encouraged fluids and for patient to eat a meal.  No recommendations to med changes.  Routed to Dr. Gwenlyn Found for further recommendations.

## 2016-07-26 ENCOUNTER — Ambulatory Visit: Payer: BLUE CROSS/BLUE SHIELD

## 2016-07-26 ENCOUNTER — Ambulatory Visit: Payer: BLUE CROSS/BLUE SHIELD | Admitting: Pulmonary Disease

## 2016-07-27 ENCOUNTER — Ambulatory Visit (INDEPENDENT_AMBULATORY_CARE_PROVIDER_SITE_OTHER): Payer: BLUE CROSS/BLUE SHIELD | Admitting: General Practice

## 2016-07-27 DIAGNOSIS — E538 Deficiency of other specified B group vitamins: Secondary | ICD-10-CM

## 2016-07-27 MED ORDER — CYANOCOBALAMIN 1000 MCG/ML IJ SOLN
1000.0000 ug | Freq: Once | INTRAMUSCULAR | Status: AC
  Administered 2016-07-27: 1000 ug via INTRAMUSCULAR

## 2016-07-28 NOTE — Telephone Encounter (Signed)
None

## 2016-08-02 ENCOUNTER — Encounter: Payer: Self-pay | Admitting: Gastroenterology

## 2016-08-09 ENCOUNTER — Telehealth (HOSPITAL_COMMUNITY): Payer: Self-pay | Admitting: Internal Medicine

## 2016-08-09 ENCOUNTER — Encounter (HOSPITAL_COMMUNITY): Payer: Self-pay | Admitting: Internal Medicine

## 2016-08-09 NOTE — Telephone Encounter (Signed)
Mailed letter with [facility] along with my chart message... KJ  °

## 2016-08-31 ENCOUNTER — Ambulatory Visit: Payer: BLUE CROSS/BLUE SHIELD | Admitting: Pulmonary Disease

## 2016-08-31 ENCOUNTER — Other Ambulatory Visit: Payer: Self-pay | Admitting: Internal Medicine

## 2016-10-04 ENCOUNTER — Telehealth: Payer: Self-pay | Admitting: Cardiovascular Disease

## 2016-10-04 NOTE — Telephone Encounter (Signed)
Spoke with pt mother, do not see that he needs blood work prior to upcoming appointment.

## 2016-10-04 NOTE — Telephone Encounter (Signed)
Patient mother calling, states that her son needs to have blood work completed for cholesterol and sugar, I did not see order. Thanks.

## 2016-10-27 ENCOUNTER — Encounter: Payer: Self-pay | Admitting: Cardiovascular Disease

## 2016-10-27 ENCOUNTER — Ambulatory Visit (INDEPENDENT_AMBULATORY_CARE_PROVIDER_SITE_OTHER): Payer: 59 | Admitting: Cardiovascular Disease

## 2016-10-27 VITALS — BP 96/68 | HR 78

## 2016-10-27 DIAGNOSIS — R079 Chest pain, unspecified: Secondary | ICD-10-CM

## 2016-10-27 DIAGNOSIS — I480 Paroxysmal atrial fibrillation: Secondary | ICD-10-CM | POA: Diagnosis not present

## 2016-10-27 DIAGNOSIS — E785 Hyperlipidemia, unspecified: Secondary | ICD-10-CM

## 2016-10-27 NOTE — Assessment & Plan Note (Signed)
History of CAD status post RCA PCI and drug-eluting stenting by myself 03/18/16. He had no other significant CAD. He does continue to complain of exertional chest pain which most likely is noncardiac.

## 2016-10-27 NOTE — Progress Notes (Signed)
10/27/2016 [name]   12/12/1955  [id number]  Primary Physician Plotnikov, Jon Lacks, MD Primary Cardiologist: Lorretta Harp MD Renae Gloss  HPI:  [name] a 61 year old single [place] male whose mother is a patient of mine. I last saw him in the office 05/04/16. He has a history of treated hypertension, hyperlipidemia and family history for heart disease. He is complained of chest pain off and on for several years. Somewhat exertional. He did have a stress test done in January that was low risk. He's had a persistent cough over the last 6 months. Recent chest CT performed 01/08/16 showed no significant pulmonary abnormalities but there was a small pericardial effusion and calcium in the LAD territory. He underwent outpatient cardiac catheterization on 02/05/16 B right radial approach revealing a normal left system with moderate hypodense mid dominant RCA stenosis. He developed ventricular fibrillation after the third coronary injection requiring CPR and defibrillation. Because of ongoing chest pain I performed PCI and drug-eluting stenting of his mid RCA via the right femoral approach 03/10/16. The procedure was uncomplicated. He continues to have atypical chest pain. His PCP related discontinue the statin drug for presumed "statin intolerance". He also complains of occasional hypotension.   Current Outpatient Prescriptions  Medication Sig Dispense Refill  . pantoprazole (PROTONIX) 40 MG tablet Take 1 tablet (40 mg total) by mouth daily. (Patient taking differently: Take 40 mg by mouth as directed. ) 30 tablet 12  . aspirin EC 81 MG tablet Take 1 tablet (81 mg total) by mouth daily. 30 tablet 0  . atorvastatin (LIPITOR) 10 MG tablet Take 1 tablet (10 mg total) by mouth once a week. 30 tablet 11  . clopidogrel (PLAVIX) 75 MG tablet Take 1 tablet (75 mg total) by mouth daily. 30 tablet 11  . cyanocobalamin (COBAL-1000) 1000 MCG/ML injection Vit B12 1000 mcg qd x 4  days, then 1000 mcg weekly x4 then q 2 weeks sq. 10 mL 11  . doxazosin (CARDURA) 8 MG tablet TAKE ONE-HALF TABLET BY MOUTH TWICE DAILY (Patient taking differently: Take 4 mg by mouth twice daily) 90 tablet 2  . finasteride (PROSCAR) 5 MG tablet TAKE ONE TABLET BY MOUTH ONCE DAILY 90 tablet 2  . fluticasone furoate-vilanterol (BREO ELLIPTA) 100-25 MCG/INH AEPB Inhale 1 puff into the lungs daily. 28 each 5  . isosorbide mononitrate (IMDUR) 30 MG 24 hr tablet Take 0.5 tablets (15 mg total) by mouth daily. 45 tablet 3  . loratadine (CLARITIN) 10 MG tablet Take 1 tablet (10 mg total) by mouth daily. (Patient not taking: Reported on 05/28/2016) 30 tablet 11  . meloxicam (MOBIC) 7.5 MG tablet Take 1 tablet (7.5 mg total) by mouth daily. 15 tablet 1  . montelukast (SINGULAIR) 10 MG tablet Take 1 tablet (10 mg total) by mouth at bedtime. (Patient not taking: Reported on 05/28/2016) 30 tablet 5   No current facility-administered medications for this visit.     Allergies  Allergen Reactions  . Flomax [Tamsulosin Hcl] Other (See Comments)    Hard time breathing  . Atorvastatin Other (See Comments)    REACTION: leg cramps per patient with a large dose    Social History   Social History  . Marital status: Divorced    Spouse name: N/A  . Number of children: 1  . Years of education: N/A   Occupational History  . Animal nutritionist    Social History Main Topics  . Smoking status: Never Smoker  . Smokeless  tobacco: Never Used  . Alcohol use No     Comment: rare  . Drug use: No  . Sexual activity: No   Other Topics Concern  . Not on file   Social History Narrative   Coffee daily      Review of Systems: General: negative for chills, fever, night sweats or weight changes.  Cardiovascular: negative for chest pain, dyspnea on exertion, edema, orthopnea, palpitations, paroxysmal nocturnal dyspnea or shortness of breath Dermatological: negative for rash Respiratory: negative for cough or  wheezing Urologic: negative for hematuria Abdominal: negative for nausea, vomiting, diarrhea, bright red blood per rectum, melena, or hematemesis Neurologic: negative for visual changes, syncope, or dizziness All other systems reviewed and are otherwise negative except as noted above.    Blood pressure 96/68, pulse 78.  General appearance: alert and no distress Neck: no adenopathy, no carotid bruit, no JVD, supple, symmetrical, trachea midline and thyroid not enlarged, symmetric, no tenderness/mass/nodules Lungs: clear to auscultation bilaterally Heart: regular rate and rhythm, S1, S2 normal, no murmur, click, rub or gallop Extremities: extremities normal, atraumatic, no cyanosis or edema  EKG sinus rhythm at 78 without ST or T-wave changes. I personally reviewed this EKG  ASSESSMENT AND PLAN:   Dyslipidemia History of hyperlipidemia on statin therapy with recent lipid profile performed 06/09/16 revealed total cholesterol 152, LDL of 90 and HDL of 49.  Chest pain with moderate risk of acute coronary syndrome History of CAD status post RCA PCI and drug-eluting stenting by myself 03/18/16. He had no other significant CAD. He does continue to complain of exertional chest pain which most likely is noncardiac.      Lorretta Harp MD FACP,FACC,FAHA, [facility] 10/27/2016 10:24 AM

## 2016-10-27 NOTE — Assessment & Plan Note (Signed)
History of hyperlipidemia on statin therapy with recent lipid profile performed 06/09/16 revealed total cholesterol 152, LDL of 90 and HDL of 49.

## 2016-10-27 NOTE — Patient Instructions (Signed)

## 2016-11-15 ENCOUNTER — Other Ambulatory Visit: Payer: Self-pay | Admitting: Internal Medicine

## 2016-11-30 ENCOUNTER — Telehealth: Payer: Self-pay

## 2016-11-30 NOTE — Telephone Encounter (Signed)
Vascepa was denied due to patient not having a diagnosis if severe hypertriglyceridemia(500 or higher).  Please advise about medication

## 2016-11-30 NOTE — Telephone Encounter (Signed)
Take fish oil OTC Thx

## 2016-12-01 NOTE — Telephone Encounter (Signed)
Pt.notified

## 2016-12-21 ENCOUNTER — Telehealth: Payer: Self-pay | Admitting: Cardiovascular Disease

## 2016-12-21 NOTE — Telephone Encounter (Signed)
See previous telephone note. 

## 2016-12-21 NOTE — Telephone Encounter (Signed)
Flecainide is not on patients current medication list. Per dr berry no refill given.

## 2016-12-21 NOTE — Telephone Encounter (Signed)
New message        *STAT* If patient is at the pharmacy, call can be transferred to refill team.   1. Which medications need to be refilled? (please list name of each medication and dose if known) flecainide 100mg  2 pills as needed 2. Which pharmacy/location (including street and city if local pharmacy) is medication to be sent to? [facility] [address] 3. Do they need a 30 day or 90 day supply? 30 day supply Not sure who prescribed rx.  Language barrier.

## 2016-12-21 NOTE — Telephone Encounter (Signed)
°*  STAT* If patient is at the pharmacy, call can be transferred to refill team.   1. Which medications need to be refilled? (please list name of each medication and dose if known) Flecainide 100 mg  2. Which pharmacy/location (including street and city if local pharmacy) is medication to be sent to? [place], [place]  3. Do they need a 30 day or 90 day supply? [place]

## 2017-01-28 ENCOUNTER — Other Ambulatory Visit: Payer: Self-pay | Admitting: Cardiology

## 2017-01-28 ENCOUNTER — Other Ambulatory Visit: Payer: Self-pay | Admitting: Internal Medicine

## 2017-03-28 ENCOUNTER — Other Ambulatory Visit: Payer: Self-pay | Admitting: Internal Medicine

## 2017-03-28 ENCOUNTER — Encounter: Payer: Self-pay | Admitting: Internal Medicine

## 2017-03-30 MED ORDER — DOXAZOSIN MESYLATE 8 MG PO TABS: 4.0000 mg | ORAL_TABLET | Freq: Two times a day (BID) | ORAL | 0 refills | Status: DC

## 2017-04-04 ENCOUNTER — Telehealth: Payer: Self-pay | Admitting: Internal Medicine

## 2017-04-04 DIAGNOSIS — Z0000 Encounter for general adult medical examination without abnormal findings: Secondary | ICD-10-CM

## 2017-04-04 NOTE — Telephone Encounter (Signed)
Pt's mother called asking for labs to be put in for the pt so that he can have them done before his appointment. Please advise.  I did let her know that Dr Alain Marion is out of the office this week.

## 2017-04-05 NOTE — Telephone Encounter (Signed)
Patients appt on 11/2 please advise on labs

## 2017-04-10 ENCOUNTER — Encounter: Payer: Self-pay | Admitting: Internal Medicine

## 2017-04-10 NOTE — Telephone Encounter (Signed)
Ok Vit B12, Vit D, BMET, CBC, LFT. TSH, UA, lipids, CK Thx

## 2017-04-12 NOTE — Telephone Encounter (Signed)
Orders entered

## 2017-04-15 ENCOUNTER — Other Ambulatory Visit (INDEPENDENT_AMBULATORY_CARE_PROVIDER_SITE_OTHER): Payer: BLUE CROSS/BLUE SHIELD

## 2017-04-15 ENCOUNTER — Ambulatory Visit: Payer: 59 | Admitting: Internal Medicine

## 2017-04-15 DIAGNOSIS — I10 Essential (primary) hypertension: Secondary | ICD-10-CM | POA: Diagnosis not present

## 2017-04-15 DIAGNOSIS — R29898 Other symptoms and signs involving the musculoskeletal system: Secondary | ICD-10-CM

## 2017-04-15 DIAGNOSIS — E785 Hyperlipidemia, unspecified: Secondary | ICD-10-CM | POA: Diagnosis not present

## 2017-04-15 DIAGNOSIS — Z0000 Encounter for general adult medical examination without abnormal findings: Secondary | ICD-10-CM

## 2017-04-15 LAB — HEPATIC FUNCTION PANEL
ALK PHOS: 65 U/L (ref 39–117)
ALT: 35 U/L (ref 0–53)
AST: 23 U/L (ref 0–37)
Albumin: 4.1 g/dL (ref 3.5–5.2)
BILIRUBIN DIRECT: 0.1 mg/dL (ref 0.0–0.3)
TOTAL PROTEIN: 7 g/dL (ref 6.0–8.3)
Total Bilirubin: 0.5 mg/dL (ref 0.2–1.2)

## 2017-04-15 LAB — CBC WITH DIFFERENTIAL/PLATELET
BASOS ABS: 0 10*3/uL (ref 0.0–0.1)
BASOS PCT: 0.6 % (ref 0.0–3.0)
EOS ABS: 0.1 10*3/uL (ref 0.0–0.7)
Eosinophils Relative: 1.4 % (ref 0.0–5.0)
HEMATOCRIT: 44.7 % (ref 39.0–52.0)
HEMOGLOBIN: 15.6 g/dL (ref 13.0–17.0)
LYMPHS PCT: 23.6 % (ref 12.0–46.0)
Lymphs Abs: 1.8 10*3/uL (ref 0.7–4.0)
MCHC: 34.8 g/dL (ref 30.0–36.0)
MCV: 87.7 fl (ref 78.0–100.0)
Monocytes Absolute: 0.7 10*3/uL (ref 0.1–1.0)
Monocytes Relative: 9.4 % (ref 3.0–12.0)
Neutro Abs: 5 10*3/uL (ref 1.4–7.7)
Neutrophils Relative %: 65 % (ref 43.0–77.0)
Platelets: 241 10*3/uL (ref 150.0–400.0)
RBC: 5.1 Mil/uL (ref 4.22–5.81)
RDW: 13.1 % (ref 11.5–15.5)
WBC: 7.7 10*3/uL (ref 4.0–10.5)

## 2017-04-15 LAB — URINALYSIS
BILIRUBIN URINE: NEGATIVE
Hgb urine dipstick: NEGATIVE
KETONES UR: NEGATIVE
LEUKOCYTES UA: NEGATIVE
Nitrite: NEGATIVE
Specific Gravity, Urine: 1.02 (ref 1.000–1.030)
Total Protein, Urine: NEGATIVE
URINE GLUCOSE: NEGATIVE
UROBILINOGEN UA: 0.2 (ref 0.0–1.0)
pH: 5.5 (ref 5.0–8.0)

## 2017-04-15 LAB — BASIC METABOLIC PANEL
BUN: 14 mg/dL (ref 6–23)
CALCIUM: 9.3 mg/dL (ref 8.4–10.5)
CO2: 26 mEq/L (ref 19–32)
Chloride: 104 mEq/L (ref 96–112)
Creatinine, Ser: 0.96 mg/dL (ref 0.40–1.50)
GFR: 85.11 mL/min (ref >60.00)
Glucose, Bld: 95 mg/dL (ref 70–99)
POTASSIUM: 3.9 meq/L (ref 3.5–5.1)
SODIUM: 137 meq/L (ref 135–145)

## 2017-04-15 LAB — VITAMIN B12: VITAMIN B 12: 386 pg/mL (ref 211–911)

## 2017-04-15 LAB — LIPID PANEL
Cholesterol: 201 mg/dL — ABNORMAL HIGH (ref 0–200)
HDL: 43.4 mg/dL (ref >39.00)
NONHDL: 157.83
Total CHOL/HDL Ratio: 5
Triglycerides: 208 mg/dL — ABNORMAL HIGH (ref 0.0–149.0)
VLDL: 41.6 mg/dL — ABNORMAL HIGH (ref 0.0–40.0)

## 2017-04-15 LAB — CK: CK TOTAL: 92 U/L (ref 7–232)

## 2017-04-15 LAB — TSH: TSH: 1.81 u[IU]/mL (ref 0.35–4.50)

## 2017-04-15 LAB — LDL CHOLESTEROL, DIRECT: Direct LDL: 110 mg/dL

## 2017-04-21 LAB — VITAMIN D 1,25 DIHYDROXY
VITAMIN D3 1, 25 (OH): 27 pg/mL
Vitamin D 1, 25 (OH)2 Total: 27 pg/mL (ref 18–72)

## 2017-04-22 ENCOUNTER — Encounter: Payer: Self-pay | Admitting: Internal Medicine

## 2017-04-22 ENCOUNTER — Ambulatory Visit (INDEPENDENT_AMBULATORY_CARE_PROVIDER_SITE_OTHER): Payer: BLUE CROSS/BLUE SHIELD | Admitting: Internal Medicine

## 2017-04-22 VITALS — BP 106/70 | HR 78 | Temp 98.1°F | Ht 69.0 in | Wt 207.0 lb

## 2017-04-22 DIAGNOSIS — Z0000 Encounter for general adult medical examination without abnormal findings: Secondary | ICD-10-CM | POA: Diagnosis not present

## 2017-04-22 DIAGNOSIS — R0789 Other chest pain: Secondary | ICD-10-CM

## 2017-04-22 DIAGNOSIS — R29898 Other symptoms and signs involving the musculoskeletal system: Secondary | ICD-10-CM

## 2017-04-22 DIAGNOSIS — R635 Abnormal weight gain: Secondary | ICD-10-CM

## 2017-04-22 DIAGNOSIS — I10 Essential (primary) hypertension: Secondary | ICD-10-CM

## 2017-04-22 DIAGNOSIS — I2510 Atherosclerotic heart disease of native coronary artery without angina pectoris: Secondary | ICD-10-CM

## 2017-04-22 DIAGNOSIS — Z23 Encounter for immunization: Secondary | ICD-10-CM

## 2017-04-22 DIAGNOSIS — E785 Hyperlipidemia, unspecified: Secondary | ICD-10-CM

## 2017-04-22 DIAGNOSIS — Z9861 Coronary angioplasty status: Secondary | ICD-10-CM

## 2017-04-22 NOTE — Patient Instructions (Signed)
  Change breakfast routine Do not take meds on empty stomach Restart Pantoprazole  Start exercising

## 2017-04-22 NOTE — Assessment & Plan Note (Signed)
CK

## 2017-04-22 NOTE — Assessment & Plan Note (Signed)
Sx's did not improve per pt

## 2017-04-22 NOTE — Progress Notes (Addendum)
Subjective:  Patient ID: [name], male    DOB: 24-May-1956  Age: 61 y.o. MRN: [id number]  CC: No chief complaint on file.   HPI OLANDER FRIEDL presents for a well exam  C/o chest tightness in am after breakfast - would resolve after 1 hr. Sx's started after his STENT procedure. Worse w/fast walking in am only. Not taking Protonix, Breo, Singulair. Not taking Imdur. F/u CAD, dyslipidemia, HTN  Outpatient Medications Prior to Visit  Medication Sig Dispense Refill  . aspirin EC 81 MG tablet Take 1 tablet (81 mg total) by mouth daily. 30 tablet 0  . atorvastatin (LIPITOR) 10 MG tablet Take 1 tablet (10 mg total) by mouth once a week. 30 tablet 11  . clopidogrel (PLAVIX) 75 MG tablet TAKE ONE TABLET BY MOUTH ONCE DAILY 30 tablet 11  . cyanocobalamin (COBAL-1000) 1000 MCG/ML injection Vit B12 1000 mcg qd x 4 days, then 1000 mcg weekly x4 then q 2 weeks sq. 10 mL 11  . doxazosin (CARDURA) 8 MG tablet Take 0.5 tablets (4 mg total) by mouth 2 (two) times daily. Needs office visit for further refills 90 tablet 0  . finasteride (PROSCAR) 5 MG tablet TAKE ONE TABLET BY MOUTH ONCE DAILY 90 tablet 2  . fluticasone furoate-vilanterol (BREO ELLIPTA) 100-25 MCG/INH AEPB Inhale 1 puff into the lungs daily. 28 each 5  . loratadine (CLARITIN) 10 MG tablet Take 1 tablet (10 mg total) by mouth daily. 30 tablet 11  . meloxicam (MOBIC) 7.5 MG tablet Take 1 tablet (7.5 mg total) by mouth daily. 15 tablet 1  . montelukast (SINGULAIR) 10 MG tablet Take 1 tablet (10 mg total) by mouth at bedtime. 30 tablet 5  . pantoprazole (PROTONIX) 40 MG tablet Take 1 tablet (40 mg total) by mouth daily. (Patient taking differently: Take 40 mg by mouth as directed. ) 30 tablet 12  . VASCEPA 1 g CAPS TAKE TWO CAPSULES BY MOUTH TWICE DAILY 120 capsule 5  . isosorbide mononitrate (IMDUR) 30 MG 24 hr tablet Take 0.5 tablets (15 mg total) by mouth daily. 45 tablet 3   No facility-administered medications prior to  visit.     ROS Review of Systems  Constitutional: Negative for appetite change, fatigue and unexpected weight change.  HENT: Negative for congestion, nosebleeds, sneezing, sore throat and trouble swallowing.   Eyes: Negative for itching and visual disturbance.  Respiratory: Positive for shortness of breath. Negative for cough.   Cardiovascular: Positive for chest pain. Negative for palpitations and leg swelling.  Gastrointestinal: Negative for abdominal distention, blood in stool, diarrhea and nausea.  Genitourinary: Negative for frequency and hematuria.  Musculoskeletal: Negative for back pain, gait problem, joint swelling and neck pain.  Skin: Negative for rash.  Neurological: Negative for dizziness, tremors, speech difficulty and weakness.  Psychiatric/Behavioral: Negative for agitation, dysphoric mood, sleep disturbance and suicidal ideas. The patient is not nervous/anxious.     Objective:  BP 106/70 (BP Location: Left Arm, Patient Position: Sitting, Cuff Size: Large)   Pulse 78   Temp 98.1 F (36.7 C) (Oral)   Ht 5\' 9"  (1.753 m)   Wt 207 lb (93.9 kg)   SpO2 97%   BMI 30.57 kg/m   BP Readings from Last 3 Encounters:  04/22/17 106/70  10/27/16 96/68  05/28/16 122/78    Wt Readings from Last 3 Encounters:  04/22/17 207 lb (93.9 kg)  05/28/16 205 lb 9.6 oz (93.3 kg)  05/04/16 206 lb (93.4 kg)  Physical Exam  Constitutional: He is oriented to person, place, and time. He appears well-developed. No distress.  NAD  HENT:  Mouth/Throat: Oropharynx is clear and moist.  Eyes: Pupils are equal, round, and reactive to light. Conjunctivae are normal.  Neck: Normal range of motion. No JVD present. No thyromegaly present.  Cardiovascular: Normal rate, regular rhythm, normal heart sounds and intact distal pulses.  Exam reveals no gallop and no friction rub.   No murmur heard. Pulmonary/Chest: Effort normal and breath sounds normal. No respiratory distress. He has no wheezes.  He has no rales. He exhibits no tenderness.  Abdominal: Soft. Bowel sounds are normal. He exhibits no distension and no mass. There is no tenderness. There is no rebound and no guarding.  Musculoskeletal: Normal range of motion. He exhibits no edema or tenderness.  Lymphadenopathy:    He has no cervical adenopathy.  Neurological: He is alert and oriented to person, place, and time. He has normal reflexes. No cranial nerve deficit. He exhibits normal muscle tone. He displays a negative Romberg sign. Coordination and gait normal.  Skin: Skin is warm and dry. No rash noted.  Psychiatric: He has a normal mood and affect. His behavior is normal. Judgment and thought content normal.  Pt declined rectal - just had it done by Urology  Ordering labs with the Z00.00 was an error and the labs should have been ordered with medical diagnosis codes (corrections made).  Lab Results  Component Value Date   WBC 7.7 04/15/2017   HGB 15.6 04/15/2017   HCT 44.7 04/15/2017   PLT 241.0 04/15/2017   GLUCOSE 95 04/15/2017   CHOL 201 (H) 04/15/2017   TRIG 208.0 (H) 04/15/2017   HDL 43.40 04/15/2017   LDLDIRECT 110.0 04/15/2017   LDLCALC 90 06/09/2016   ALT 35 04/15/2017   AST 23 04/15/2017   NA 137 04/15/2017   K 3.9 04/15/2017   CL 104 04/15/2017   CREATININE 0.96 04/15/2017   BUN 14 04/15/2017   CO2 26 04/15/2017   TSH 1.81 04/15/2017   PSA 0.52 07/30/2015   INR 1.0 03/10/2016   HGBA1C 5.7 07/30/2015    Dg Chest 2 View  Result Date: 05/28/2016 CLINICAL DATA:  Cough and dyspnea exertion. EXAM: CHEST  2 VIEW COMPARISON:  CXR from 11/18/2015, chest CT 01/08/2016 FINDINGS: The heart size and mediastinal contours are within normal limits. The thoracic aorta is slightly uncoiled in appearance without aneurysmal dilatation. Both lungs are clear. Chronic lingular scarring is seen. No acute osseous abnormality. IMPRESSION: No active cardiopulmonary disease.  Chronic lingular scarring. Electronically Signed    By: Ashley Royalty M.D.   On: 05/28/2016 15:43    Assessment & Plan:   There are no diagnoses linked to this encounter. I am having Mr. Hendricksen maintain his loratadine, montelukast, aspirin EC, pantoprazole, atorvastatin, isosorbide mononitrate, meloxicam, cyanocobalamin, fluticasone furoate-vilanterol, finasteride, VASCEPA, clopidogrel, and doxazosin.  No orders of the defined types were placed in this encounter.    Follow-up: No Follow-up on file.  Walker Kehr, MD

## 2017-04-22 NOTE — Assessment & Plan Note (Signed)
?  etiology: chest tightness in am after breakfast - would resolve after 1 hr. Sx's started after his STENT procedure. Worse w/fast walking in am only. Not taking Protonix, Breo, Singulair. Not taking Imdur   Change breakfast routine Do not take meds on empty stomach Restart Pantoprazole  Start exercising

## 2017-04-22 NOTE — Assessment & Plan Note (Addendum)
We discussed age appropriate health related issues, including available/recomended screening tests and vaccinations. We discussed a need for adhering to healthy diet and exercise. Labs were ordered to be later reviewed . All questions were answered.  PSA  Ordering labs with the Z00.00 was an error and the labs should have been ordered with medical diagnosis codes (corrections made).

## 2017-04-22 NOTE — Assessment & Plan Note (Signed)
Wt Readings from Last 3 Encounters:  04/22/17 207 lb (93.9 kg)  05/28/16 205 lb 9.6 oz (93.3 kg)  05/04/16 206 lb (93.4 kg)

## 2017-04-27 NOTE — Addendum Note (Signed)
Addended by: Karren Cobble on: 04/27/2017 12:04 PM   Modules accepted: Orders

## 2017-05-06 ENCOUNTER — Encounter: Payer: Self-pay | Admitting: Cardiovascular Disease

## 2017-05-06 ENCOUNTER — Ambulatory Visit (INDEPENDENT_AMBULATORY_CARE_PROVIDER_SITE_OTHER): Payer: BLUE CROSS/BLUE SHIELD | Admitting: Cardiovascular Disease

## 2017-05-06 VITALS — BP 110/70 | HR 79 | Ht 69.0 in | Wt 206.4 lb

## 2017-05-06 DIAGNOSIS — I480 Paroxysmal atrial fibrillation: Secondary | ICD-10-CM

## 2017-05-06 DIAGNOSIS — E785 Hyperlipidemia, unspecified: Secondary | ICD-10-CM

## 2017-05-06 DIAGNOSIS — I10 Essential (primary) hypertension: Secondary | ICD-10-CM | POA: Diagnosis not present

## 2017-05-06 DIAGNOSIS — I2510 Atherosclerotic heart disease of native coronary artery without angina pectoris: Secondary | ICD-10-CM | POA: Diagnosis not present

## 2017-05-06 MED ORDER — PRAVASTATIN SODIUM 20 MG PO TABS: 20.0000 mg | ORAL_TABLET | Freq: Every evening | ORAL | 1 refills | Status: DC

## 2017-05-06 NOTE — Assessment & Plan Note (Signed)
History of hyperlipidemia intolerant to statin therapy only on Lipitor once a day. I'm going to start Pravachol 20 mg daily addition to Co Q 10 200 mg and will recheck a lipid liver profile in 2 months.

## 2017-05-06 NOTE — Assessment & Plan Note (Signed)
History of essential hypertension with blood pressure measured 110/70. He is not on antihypertensive medication.

## 2017-05-06 NOTE — Patient Instructions (Signed)
Medication Instructions: STOP Atorvastatin  START Pravastatin 20 mg daily.  START Co Q 10--200 mg daily   Labwork: Your physician recommends that you return for a FASTING lipid profile and hepatic function panel in 2 months.  Follow-Up: We request that you follow-up in: 6 months with an extender and in 12 months with Dr Andria Rhein will receive a reminder letter in the mail two months in advance. If you don't receive a letter, please call our office to schedule the follow-up appointment.  If you need a refill on your cardiac medications before your next appointment, please call your pharmacy.

## 2017-05-06 NOTE — Progress Notes (Signed)
05/06/2017 [name]   April 21, 1956  [id number]  Primary Physician Plotnikov, Jon Lacks, MD Primary Cardiologist: Jon Harp MD [name], [place]  HPI:  [name] is a 61 y.o.  single [place] male whose mother is a patient of mine. I last saw him in the office  10/27/16. He has a history of treated hypertension, hyperlipidemia and family history for heart disease. He is complained of chest pain off and on for several years. Somewhat exertional. He did have a stress test done in January that was low risk. He's had a persistent cough over the last 6 months. Recent chest CT performed 01/08/16 showed no significant pulmonary abnormalities but there was a small pericardial effusion and calcium in the LAD territory. He underwent outpatient cardiac catheterization on 02/05/16 B right radial approach revealing a normal left system with moderate hypodense mid dominant RCA stenosis. He developed ventricular fibrillation after the third coronary injection requiring CPR and defibrillation. Because of ongoing chest pain I performed PCI and drug-eluting stenting of his mid RCA via the right femoral approach 03/10/16. The procedure was uncomplicated. He continues to have atypical chest pain. His PCP related discontinue the statin drug for presumed "statin intolerance". Since I saw him 6 months ago he has been on Lipitor once a week with a recent lipid profile performed 04/15/17 revealing total cholesterol 201 the tragus or level of 208. He gets occasional early morning chest discomfort which is short-lived and worse during the summer months. This does not sound anginal.      Current Meds  Medication Sig  . aspirin EC 81 MG tablet Take 1 tablet (81 mg total) by mouth daily.  . clopidogrel (PLAVIX) 75 MG tablet TAKE ONE TABLET BY MOUTH ONCE DAILY  . cyanocobalamin (COBAL-1000) 1000 MCG/ML injection Vit B12 1000 mcg qd x 4 days, then 1000 mcg weekly x4 then q 2 weeks sq.  .  doxazosin (CARDURA) 8 MG tablet Take 0.5 tablets (4 mg total) by mouth 2 (two) times daily. Needs office visit for further refills  . finasteride (PROSCAR) 5 MG tablet TAKE ONE TABLET BY MOUTH ONCE DAILY  . pantoprazole (PROTONIX) 40 MG tablet Take 1 tablet (40 mg total) by mouth daily. (Patient taking differently: Take 40 mg once a week by mouth. )  . [DISCONTINUED] atorvastatin (LIPITOR) 10 MG tablet Take 1 tablet (10 mg total) by mouth once a week.     Allergies  Allergen Reactions  . Flomax [Tamsulosin Hcl] Other (See Comments)    Hard time breathing  . Atorvastatin Other (See Comments)    REACTION: leg cramps per patient with a large dose    Social History   Socioeconomic History  . Marital status: Divorced    Spouse name: Not on file  . Number of children: 1  . Years of education: Not on file  . Highest education level: Not on file  Social Needs  . Financial resource strain: Not on file  . Food insecurity - worry: Not on file  . Food insecurity - inability: Not on file  . Transportation needs - medical: Not on file  . Transportation needs - non-medical: Not on file  Occupational History  . Occupation: Animal nutritionist  Tobacco Use  . Smoking status: Never Smoker  . Smokeless tobacco: Never Used  Substance and Sexual Activity  . Alcohol use: No    Comment: rare  . Drug use: No  . Sexual activity: No    Birth  control/protection: Abstinence  Other Topics Concern  . Not on file  Social History Narrative   Coffee daily      Review of Systems: General: negative for chills, fever, night sweats or weight changes.  Cardiovascular: negative for chest pain, dyspnea on exertion, edema, orthopnea, palpitations, paroxysmal nocturnal dyspnea or shortness of breath Dermatological: negative for rash Respiratory: negative for cough or wheezing Urologic: negative for hematuria Abdominal: negative for nausea, vomiting, diarrhea, bright red blood per rectum, melena, or  hematemesis Neurologic: negative for visual changes, syncope, or dizziness All other systems reviewed and are otherwise negative except as noted above.    Blood pressure 110/70, pulse 79, height 5\' 9"  (1.753 m), weight 206 lb 6.4 oz (93.6 kg), SpO2 97 %.  General appearance: alert and no distress Neck: no adenopathy, no carotid bruit, no JVD, supple, symmetrical, trachea midline and thyroid not enlarged, symmetric, no tenderness/mass/nodules Lungs: clear to auscultation bilaterally Heart: regular rate and rhythm, S1, S2 normal, no murmur, click, rub or gallop Extremities: extremities normal, atraumatic, no cyanosis or edema Pulses: 2+ and symmetric Skin: Skin color, texture, turgor normal. No rashes or lesions Neurologic: Alert and oriented X 3, normal strength and tone. Normal symmetric reflexes. Normal coordination and gait  EKG sinus rhythm at 79 without ST or T-wave changes. I Personally reviewed this EKG.  ASSESSMENT AND PLAN:   Dyslipidemia History of hyperlipidemia intolerant to statin therapy only on Lipitor once a day. I'm going to start Pravachol 20 mg daily addition to Co Q 10 200 mg and will recheck a lipid liver profile in 2 months.  Essential hypertension History of essential hypertension with blood pressure measured 110/70. He is not on antihypertensive medication.  S/P PTCA (percutaneous transluminal coronary angioplasty) History of CAD status post RCA PCI and drug-eluting stenting by myself 03/18/16 with a 3 mm x 15 mm long Xience drug-eluting stent. He did have a diagnostic cath prior to that and had ventricular fibrillation when his RCA was engaged. He has occasional atypical chest pain and remains on dual antibiotic therapy.      Jon Harp MD FACP,FACC,FAHA, [facility] 05/06/2017 11:39 AM

## 2017-05-06 NOTE — Assessment & Plan Note (Addendum)
History of CAD status post RCA PCI and drug-eluting stenting by myself 03/18/16 with a 3 mm x 15 mm long Xience drug-eluting stent. He did have a diagnostic cath prior to that and had ventricular fibrillation when his RCA was engaged. He has occasional atypical chest pain and remains on dual antibiotic therapy.

## 2017-05-31 ENCOUNTER — Other Ambulatory Visit: Payer: Self-pay | Admitting: Internal Medicine

## 2017-06-12 ENCOUNTER — Encounter: Payer: Self-pay | Admitting: Internal Medicine

## 2017-06-24 ENCOUNTER — Encounter: Payer: Self-pay | Admitting: Internal Medicine

## 2017-06-28 NOTE — Assessment & Plan Note (Signed)
Labs

## 2017-06-28 NOTE — Assessment & Plan Note (Signed)
Labs Lipitor 

## 2017-07-29 ENCOUNTER — Ambulatory Visit: Payer: BLUE CROSS/BLUE SHIELD | Admitting: Internal Medicine

## 2017-08-15 ENCOUNTER — Encounter: Payer: Self-pay | Admitting: Internal Medicine

## 2017-08-17 ENCOUNTER — Encounter: Payer: Self-pay | Admitting: Internal Medicine

## 2017-08-18 NOTE — Telephone Encounter (Signed)
Pt also sent message with this web site attached (from 2017). Legalize marihuana in [place] link: [email or web address]

## 2017-08-19 ENCOUNTER — Encounter: Payer: Self-pay | Admitting: Internal Medicine

## 2017-08-19 ENCOUNTER — Ambulatory Visit: Payer: BLUE CROSS/BLUE SHIELD | Admitting: Internal Medicine

## 2017-08-19 DIAGNOSIS — E785 Hyperlipidemia, unspecified: Secondary | ICD-10-CM | POA: Diagnosis not present

## 2017-08-19 DIAGNOSIS — I480 Paroxysmal atrial fibrillation: Secondary | ICD-10-CM

## 2017-08-19 DIAGNOSIS — R0789 Other chest pain: Secondary | ICD-10-CM | POA: Diagnosis not present

## 2017-08-19 DIAGNOSIS — I2510 Atherosclerotic heart disease of native coronary artery without angina pectoris: Secondary | ICD-10-CM

## 2017-08-19 DIAGNOSIS — I10 Essential (primary) hypertension: Secondary | ICD-10-CM

## 2017-08-19 DIAGNOSIS — I2583 Coronary atherosclerosis due to lipid rich plaque: Secondary | ICD-10-CM | POA: Diagnosis not present

## 2017-08-19 DIAGNOSIS — E538 Deficiency of other specified B group vitamins: Secondary | ICD-10-CM | POA: Diagnosis not present

## 2017-08-19 NOTE — Assessment & Plan Note (Signed)
Plavix, ASA, Pravachol

## 2017-08-19 NOTE — Assessment & Plan Note (Signed)
Lipitor 

## 2017-08-19 NOTE — Assessment & Plan Note (Signed)
Cardua 4 mg/d

## 2017-08-19 NOTE — Progress Notes (Signed)
Subjective:  Patient ID: [name], male    DOB: [birth year]  Age: 62 y.o. MRN: [id number]  CC: No chief complaint on file.   HPI [name] presents for CAD, BPH, B12 def f/u Occ CP - no change  Outpatient Medications Prior to Visit  Medication Sig Dispense Refill  . aspirin EC 81 MG tablet Take 1 tablet (81 mg total) by mouth daily. 30 tablet 0  . clopidogrel (PLAVIX) 75 MG tablet TAKE ONE TABLET BY MOUTH ONCE DAILY 30 tablet 11  . cyanocobalamin (COBAL-1000) 1000 MCG/ML injection Vit B12 1000 mcg qd x 4 days, then 1000 mcg weekly x4 then q 2 weeks sq. 10 mL 11  . doxazosin (CARDURA) 8 MG tablet Take 0.5 tablets (4 mg total) by mouth 2 (two) times daily. Needs office visit for further refills 90 tablet 0  . finasteride (PROSCAR) 5 MG tablet TAKE 1 TABLET BY MOUTH ONCE DAILY 90 tablet 3  . pantoprazole (PROTONIX) 40 MG tablet Take 1 tablet (40 mg total) by mouth daily. (Patient taking differently: Take 40 mg once a week by mouth. ) 30 tablet 12  . pravastatin (PRAVACHOL) 20 MG tablet Take 1 tablet (20 mg total) every evening by mouth. 90 tablet 1   No facility-administered medications prior to visit.     ROS Review of Systems  Constitutional: Positive for fatigue. Negative for appetite change and unexpected weight change.  HENT: Negative for congestion, nosebleeds, sneezing, sore throat and trouble swallowing.   Eyes: Negative for itching and visual disturbance.  Respiratory: Negative for cough.   Cardiovascular: Negative for chest pain, palpitations and leg swelling.  Gastrointestinal: Negative for abdominal distention, blood in stool, diarrhea and nausea.  Genitourinary: Negative for frequency and hematuria.  Musculoskeletal: Negative for back pain, gait problem, joint swelling and neck pain.  Skin: Negative for rash.  Neurological: Negative for dizziness, tremors, speech difficulty and weakness.  Psychiatric/Behavioral: Negative for agitation, dysphoric  mood and sleep disturbance. The patient is not nervous/anxious.     Objective:  BP 118/82 (BP Location: Left Arm, Patient Position: Sitting, Cuff Size: Large)   Pulse 67   Temp 97.8 F (36.6 C) (Oral)   Ht 5\' 9"  (1.753 m)   Wt 208 lb (94.3 kg)   SpO2 98%   BMI 30.72 kg/m   BP Readings from Last 3 Encounters:  08/19/17 118/82  05/06/17 110/70  04/22/17 106/70    Wt Readings from Last 3 Encounters:  08/19/17 208 lb (94.3 kg)  05/06/17 206 lb 6.4 oz (93.6 kg)  04/22/17 207 lb (93.9 kg)    Physical Exam  Constitutional: He is oriented to person, place, and time. He appears well-developed. No distress.  NAD  HENT:  Mouth/Throat: Oropharynx is clear and moist.  Eyes: Conjunctivae are normal. Pupils are equal, round, and reactive to light.  Neck: Normal range of motion. No JVD present. No thyromegaly present.  Cardiovascular: Normal rate, regular rhythm, normal heart sounds and intact distal pulses. Exam reveals no gallop and no friction rub.  No murmur heard. Pulmonary/Chest: Effort normal and breath sounds normal. No respiratory distress. He has no wheezes. He has no rales. He exhibits no tenderness.  Abdominal: Soft. Bowel sounds are normal. He exhibits no distension and no mass. There is no tenderness. There is no rebound and no guarding.  Musculoskeletal: Normal range of motion. He exhibits no edema or tenderness.  Lymphadenopathy:    He has no cervical adenopathy.  Neurological: He is alert and  oriented to person, place, and time. He has normal reflexes. No cranial nerve deficit. He exhibits normal muscle tone. He displays a negative Romberg sign. Coordination and gait normal.  Skin: Skin is warm and dry. No rash noted.  Psychiatric: He has a normal mood and affect. His behavior is normal. Judgment and thought content normal.    Lab Results  Component Value Date   WBC 7.7 04/15/2017   HGB 15.6 04/15/2017   HCT 44.7 04/15/2017   PLT 241.0 04/15/2017   GLUCOSE 95  04/15/2017   CHOL 201 (H) 04/15/2017   TRIG 208.0 (H) 04/15/2017   HDL 43.40 04/15/2017   LDLDIRECT 110.0 04/15/2017   LDLCALC 90 06/09/2016   ALT 35 04/15/2017   AST 23 04/15/2017   NA 137 04/15/2017   K 3.9 04/15/2017   CL 104 04/15/2017   CREATININE 0.96 04/15/2017   BUN 14 04/15/2017   CO2 26 04/15/2017   TSH 1.81 04/15/2017   PSA 0.52 07/30/2015   INR 1.0 03/10/2016   HGBA1C 5.7 07/30/2015    Dg Chest 2 View  Result Date: 05/28/2016 CLINICAL DATA:  Cough and dyspnea exertion. EXAM: CHEST  2 VIEW COMPARISON:  CXR from 11/18/2015, chest CT 01/08/2016 FINDINGS: The heart size and mediastinal contours are within normal limits. The thoracic aorta is slightly uncoiled in appearance without aneurysmal dilatation. Both lungs are clear. Chronic lingular scarring is seen. No acute osseous abnormality. IMPRESSION: No active cardiopulmonary disease.  Chronic lingular scarring. Electronically Signed   By: Ashley Royalty M.D.   On: 05/28/2016 15:43    Assessment & Plan:   There are no diagnoses linked to this encounter. I am having [name] maintain his aspirin EC, pantoprazole, cyanocobalamin, clopidogrel, doxazosin, pravastatin, and finasteride.  No orders of the defined types were placed in this encounter.    Follow-up: No Follow-up on file.  Walker Kehr, MD

## 2017-08-19 NOTE — Assessment & Plan Note (Signed)
In NSR 

## 2017-08-19 NOTE — Assessment & Plan Note (Addendum)
On B12 Re-start B12 po Risks associated with treatment noncompliance were discussed. Compliance was encouraged. Pt declined labs due to cost

## 2017-08-19 NOTE — Assessment & Plan Note (Signed)
F/u w/Dr Gwenlyn Found Pt declined labs

## 2017-09-02 LAB — HEPATIC FUNCTION PANEL
ALBUMIN: 4.1 g/dL (ref 3.5–5.5)
ALK PHOS: 60 IU/L (ref 39–117)
ALT: 42 IU/L (ref 0–44)
AST: 26 IU/L (ref 0–40)
Bilirubin Total: 0.5 mg/dL (ref 0.0–1.2)
Bilirubin, Direct: 0.16 mg/dL (ref 0.00–0.40)
TOTAL PROTEIN: 6.7 g/dL (ref 6.0–8.5)

## 2017-09-02 LAB — LIPID PANEL
Chol/HDL Ratio: 4 ratio (ref 0.0–5.0)
Cholesterol, Total: 186 mg/dL (ref 100–199)
HDL: 47 mg/dL (ref >39)
LDL CALC: 103 mg/dL — AB (ref 0–99)
Triglycerides: 182 mg/dL — ABNORMAL HIGH (ref 0–149)
VLDL CHOLESTEROL CAL: 36 mg/dL (ref 5–40)

## 2017-09-15 ENCOUNTER — Encounter: Payer: Self-pay | Admitting: Internal Medicine

## 2017-09-15 ENCOUNTER — Other Ambulatory Visit: Payer: Self-pay | Admitting: Internal Medicine

## 2017-09-16 MED ORDER — DOXAZOSIN MESYLATE 8 MG PO TABS: 4.0000 mg | ORAL_TABLET | Freq: Two times a day (BID) | ORAL | 10 refills | Status: DC

## 2017-09-26 ENCOUNTER — Encounter: Payer: Self-pay | Admitting: Internal Medicine

## 2017-10-05 ENCOUNTER — Encounter (INDEPENDENT_AMBULATORY_CARE_PROVIDER_SITE_OTHER): Payer: Self-pay

## 2017-10-25 IMAGING — CT CT CHEST W/O CM
2 of 6 series · 15 of 36 positions shown, 19 images · non-contrast
Comparison: Radiography 11/18/2015, 07/09/2015 and as distant as
10/06/2013

CLINICAL DATA: Persistent dry cough since Monday June, 2015.
Shortness of breath. No prior history of lung disease.

EXAM:
CT CHEST WITHOUT CONTRAST
TECHNIQUE: Multidetector CT imaging of the chest was performed following the
standard protocol without IV contrast.

[Series 2: thorax · axial · 0.76mm/px · z∈[-275,-21]mm · 14 of 143 slices shown, 18 images]
[im 8/143  mediastinal]
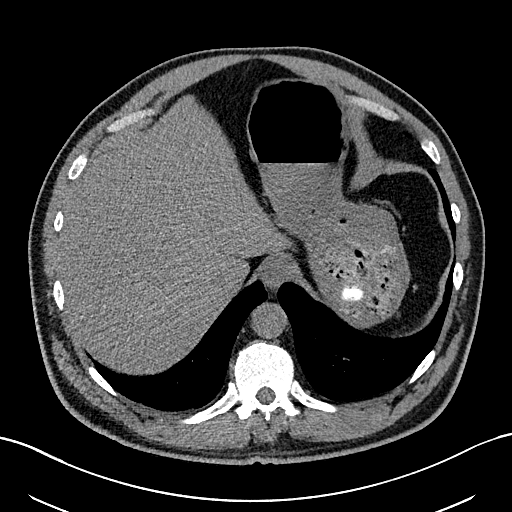
[im 8/143  lung]
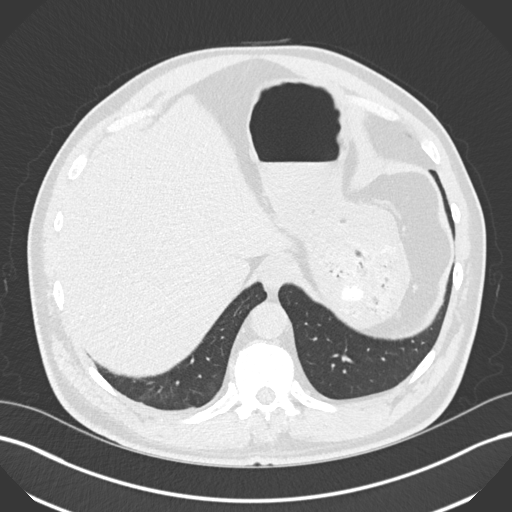
[im 22/143  lung]
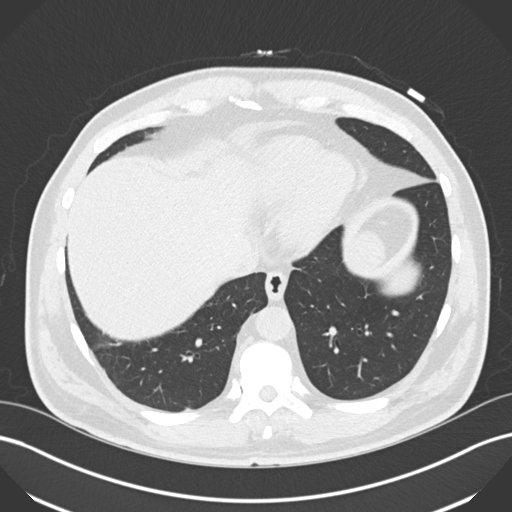
[im 29/143  lung]
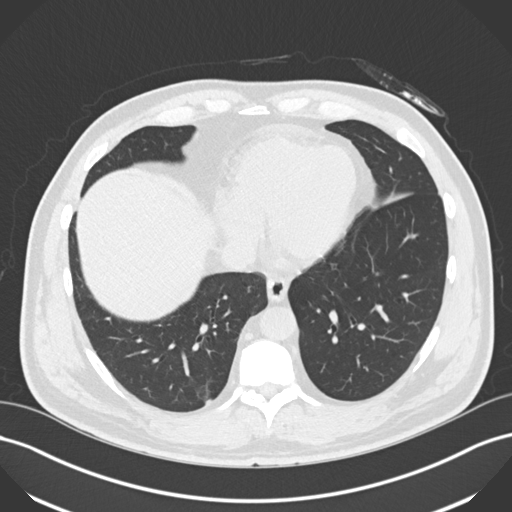
[im 36/143  lung]
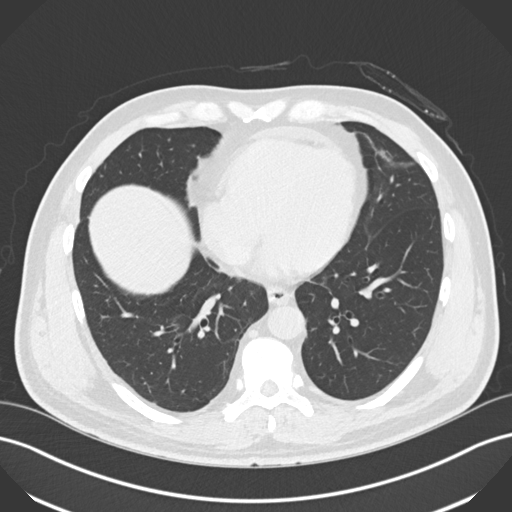
[im 50/143  mediastinal]
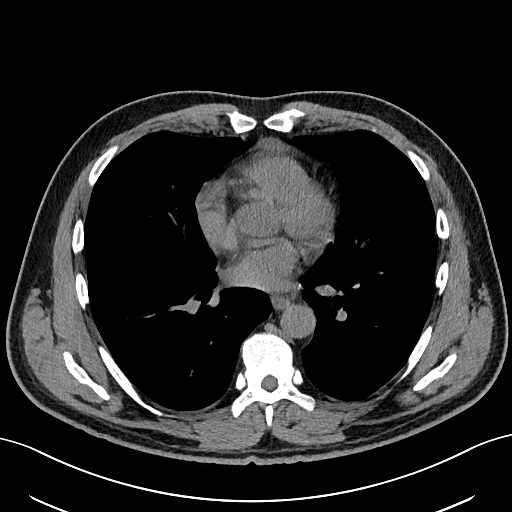
[im 50/143  lung]
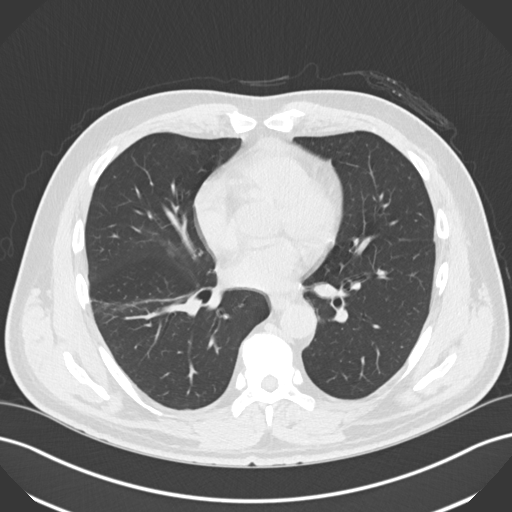
[im 57/143  lung]
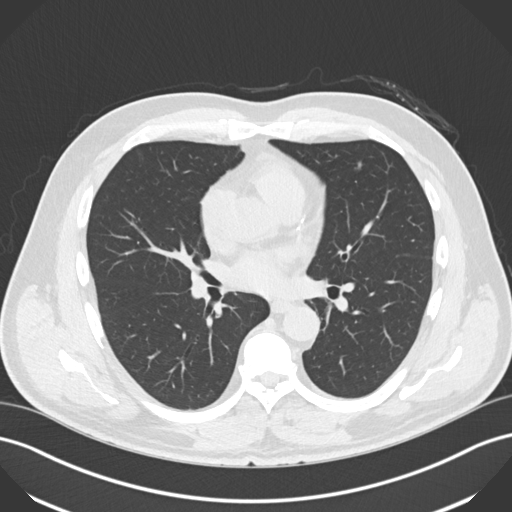
[im 64/143  lung]
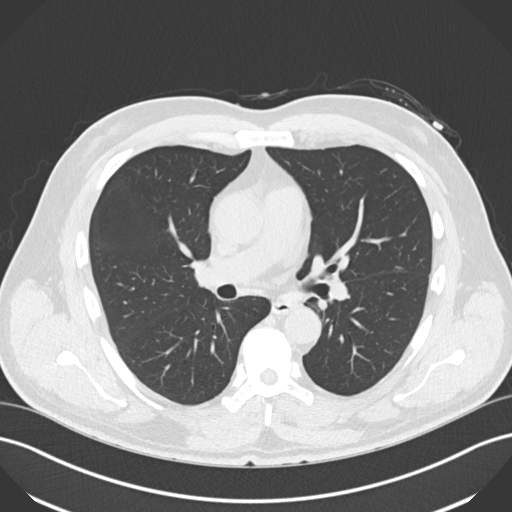
[im 79/143  lung]
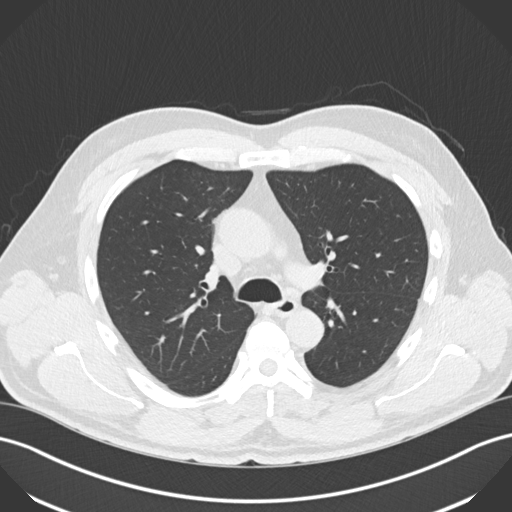
[im 86/143  mediastinal]
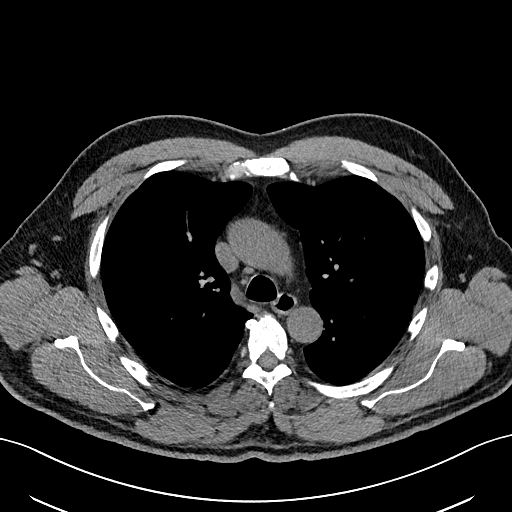
[im 86/143  lung]
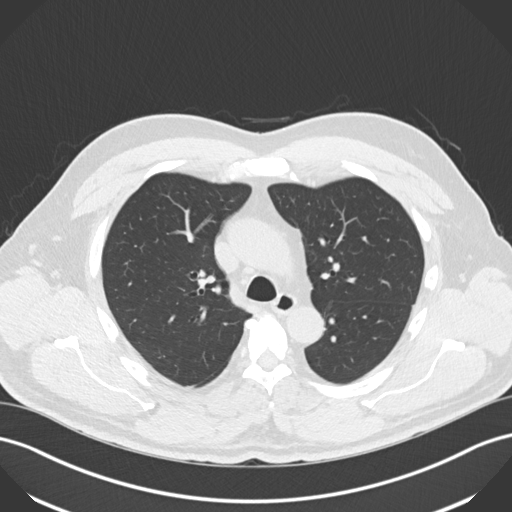
[im 93/143  lung]
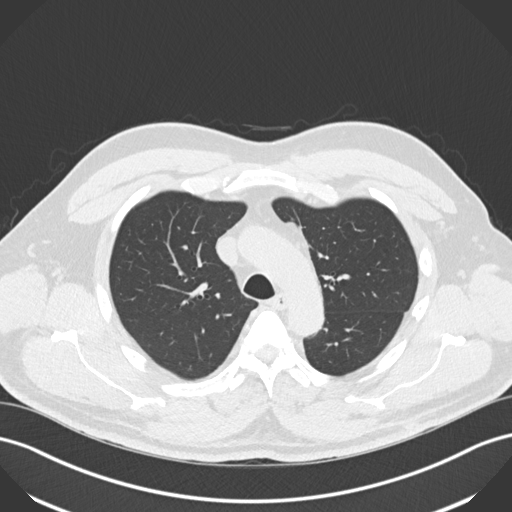
[im 107/143  lung]
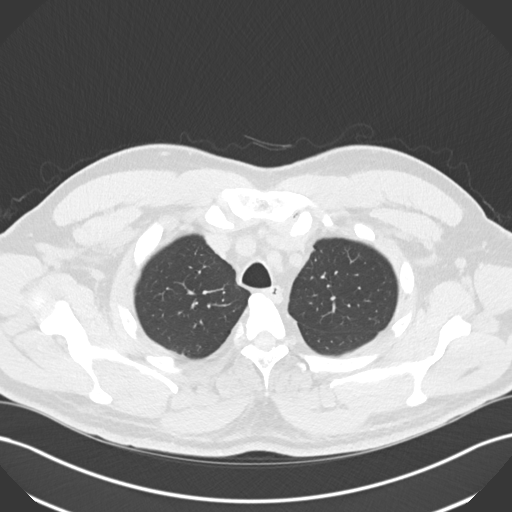
[im 114/143  lung]
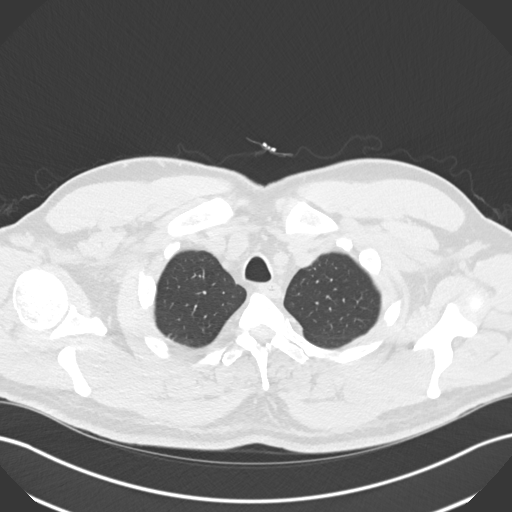
[im 121/143  mediastinal]
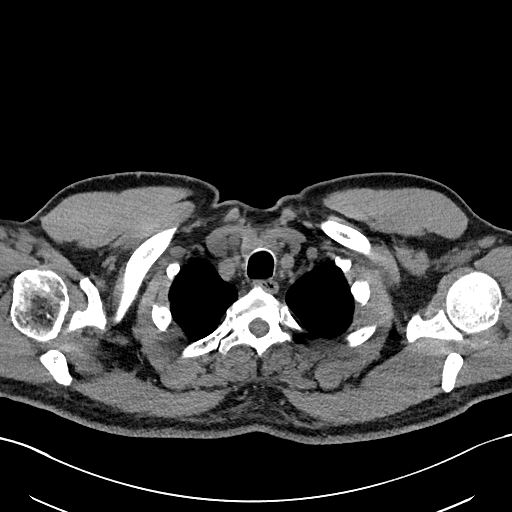
[im 121/143  lung]
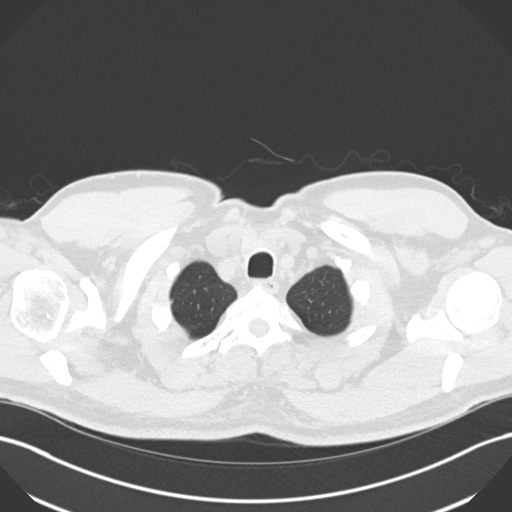
[im 135/143  lung]
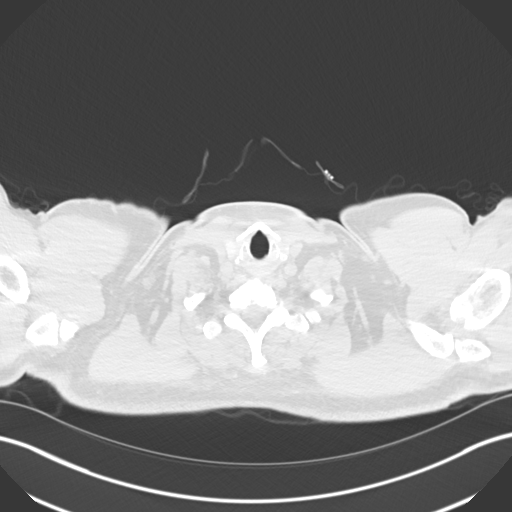

[Series 5: coronal · coronal · 0.59mm/px · 1 of 141 slices shown]
[im 71/141  lung]
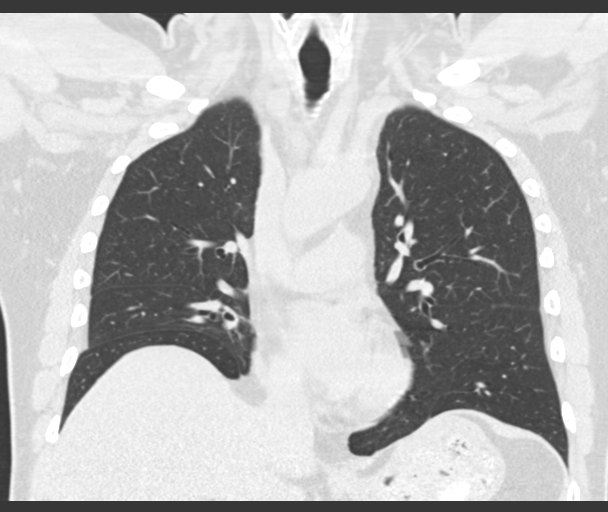

[15 of 36 positions shown; findings below may reference images not displayed]

FINDINGS: Cardiovascular: Heart size is normal. The patient has a small but
abnormal pericardial effusion. Early calcification is seen in the
region of the aortic valve. Coronary artery calcification is present
in the left system. The aorta appears normal.

Mediastinum/Nodes: No enlarged mediastinal or hilar lymph nodes.

Lungs/Pleura: No pleural fluid. In the right lower lobe, there is
scarring/volume loss anteriorly. On the left, there is mild
scarring/volume loss in the lingula. No focal pulmonary lesion.

Upper Abdomen: Normal

Musculoskeletal: Lower cervical degenerative changes. Chronic
bridging osteophytes/congenital failure of separation in the upper
thoracic region.
IMPRESSION: Areas of linear scarring in the right lower lobe and in the lingula.
Otherwise, the lungs appear normal. No evidence of emphysema or
other diffuse process.

Small but abnormal pericardial effusion.

Early calcification affecting the aortic valve. Calcification in the
left coronary system.

## 2017-10-28 ENCOUNTER — Other Ambulatory Visit: Payer: Self-pay | Admitting: Student

## 2017-10-28 NOTE — Telephone Encounter (Signed)
REFILL 

## 2017-12-06 ENCOUNTER — Encounter: Payer: Self-pay | Admitting: Cardiology

## 2017-12-06 ENCOUNTER — Ambulatory Visit: Payer: BLUE CROSS/BLUE SHIELD | Admitting: Cardiology

## 2017-12-06 VITALS — BP 116/80 | HR 72 | Ht 69.0 in | Wt 210.0 lb

## 2017-12-06 DIAGNOSIS — I10 Essential (primary) hypertension: Secondary | ICD-10-CM | POA: Diagnosis not present

## 2017-12-06 DIAGNOSIS — I4901 Ventricular fibrillation: Secondary | ICD-10-CM | POA: Diagnosis not present

## 2017-12-06 DIAGNOSIS — E785 Hyperlipidemia, unspecified: Secondary | ICD-10-CM | POA: Diagnosis not present

## 2017-12-06 DIAGNOSIS — I2510 Atherosclerotic heart disease of native coronary artery without angina pectoris: Secondary | ICD-10-CM | POA: Diagnosis not present

## 2017-12-06 DIAGNOSIS — Z9861 Coronary angioplasty status: Secondary | ICD-10-CM | POA: Diagnosis not present

## 2017-12-06 DIAGNOSIS — R0789 Other chest pain: Secondary | ICD-10-CM

## 2017-12-06 DIAGNOSIS — I480 Paroxysmal atrial fibrillation: Secondary | ICD-10-CM | POA: Diagnosis not present

## 2017-12-06 NOTE — Assessment & Plan Note (Signed)
Isolated episode July 2016 Dr Lovena Le recommended he continue ASA. He was given flecainide to be taken as a pill in the pocket.  CHADs VASc=1

## 2017-12-06 NOTE — Patient Instructions (Signed)
Medication Instructions:  Your physician recommends that you continue on your current medications as directed. Please refer to the Current Medication list given to you today.  Labwork: None   Testing/Procedures: None   Follow-Up: Your physician wants you to follow-up in: 6 months with DR BERRY. You will receive a reminder letter in the mail two months in advance. If you don't receive a letter, please call our office to schedule the follow-up appointment.  Any Other Special Instructions Will Be Listed Below (If Applicable).  If you need a refill on your cardiac medications before your next appointment, please call your pharmacy.

## 2017-12-06 NOTE — Assessment & Plan Note (Signed)
Unclear etiology, Dr Gwenlyn Found does not think this is angina

## 2017-12-06 NOTE — Assessment & Plan Note (Signed)
Cath for chest pain Sept 2017- 60% RCA noted. Procedure complicated by VF arrest. Continued chest pain led to RCA PCI Oct 2017

## 2017-12-06 NOTE — Assessment & Plan Note (Signed)
Controlled.  

## 2017-12-06 NOTE — Assessment & Plan Note (Signed)
During coronary angiogram Sept 2019- required CPR and defibrilation

## 2017-12-06 NOTE — Progress Notes (Signed)
12/06/2017 [name]   Jul 13, 1955  [id number]  Primary Physician [name], Jon Lacks, MD Primary Cardiologist: Dr [name]  HPI:  62 y.o. [place] male whose mother is a patient of Dr [name]. The patient has a history of treated hypertension, hyperlipidemia and family history for heart disease. He has complained of chest pain off and on for several years. He underwent outpatient cardiac catheterization on 02/05/16 via right radial approach revealing a normal left system with moderate hypodense mid dominant RCA stenosis of 60%. He developed ventricular fibrillation after the third coronary injection requiring CPR and defibrillation. Because of ongoing chest pain Dr [name] performed PCI and drug-eluting stenting of his mid RCAvia theright femoral approach 03/18/16.The procedurewas uncomplicated. He continues to have atypical chest pain, usually noted as early morning vague discomfort. Dr [name] saw him in the office in Nov. The pt has a history of statin intolerance. He says he took Pravachol for two months then stopped it- I think the Rx ran out and he did not fill it. His LDL in March was 103. I offered Crestor 5 mg MWF but he said his mother took that and had problems so he declined.    Current Outpatient Medications  Medication Sig Dispense Refill  . aspirin EC 81 MG tablet Take 1 tablet (81 mg total) by mouth daily. 30 tablet 0  . clopidogrel (PLAVIX) 75 MG tablet TAKE ONE TABLET BY MOUTH ONCE DAILY 30 tablet 11  . cyanocobalamin (COBAL-1000) 1000 MCG/ML injection Vit B12 1000 mcg qd x 4 days, then 1000 mcg weekly x4 then q 2 weeks sq. 10 mL 11  . doxazosin (CARDURA) 8 MG tablet Take 0.5 tablets (4 mg total) by mouth 2 (two) times daily. Needs office visit for further refills 30 tablet 10  . finasteride (PROSCAR) 5 MG tablet TAKE 1 TABLET BY MOUTH ONCE DAILY 90 tablet 3  . isosorbide mononitrate (IMDUR) 30 MG 24 hr tablet TAKE ONE-HALF TABLET BY MOUTH ONCE DAILY 45 tablet 0   No  current facility-administered medications for this visit.     Allergies  Allergen Reactions  . Flomax [Tamsulosin Hcl] Other (See Comments)    Hard time breathing  . Atorvastatin Other (See Comments)    REACTION: leg cramps per patient with a large dose    Past Medical History:  Diagnosis Date  . Atrial fibrillation ([name])   . CAD (coronary artery disease)    a.cath 01/2016: 60% stenosis RCA. Went into VF during procedure requiring CPR and defibrillation. No intervention performed at that time b.cath 02/2016: 3 mm x 15 mm Xience DES to prox-RCA   . Coronary artery disease   . GERD (gastroesophageal reflux disease)   . Headache(784.0)   . Hyperlipidemia   . Kidney stones    "passed it" (03/18/2016)  . URI (upper respiratory infection)     Social History   Socioeconomic History  . Marital status: Divorced    Spouse name: Not on file  . Number of children: 1  . Years of education: Not on file  . Highest education level: Not on file  Occupational History  . Occupation: Animal nutritionist  Social Needs  . Financial resource strain: Not on file  . Food insecurity:    Worry: Not on file    Inability: Not on file  . Transportation needs:    Medical: Not on file    Non-medical: Not on file  Tobacco Use  . Smoking status: Never Smoker  . Smokeless tobacco: Never  Used  Substance and Sexual Activity  . Alcohol use: No    Comment: rare  . Drug use: No  . Sexual activity: Never    Birth control/protection: Abstinence  Lifestyle  . Physical activity:    Days per week: Not on file    Minutes per session: Not on file  . Stress: Not on file  Relationships  . Social connections:    Talks on phone: Not on file    Gets together: Not on file    Attends religious service: Not on file    Active member of club or organization: Not on file    Attends meetings of clubs or organizations: Not on file    Relationship status: Not on file  . Intimate partner violence:    Fear of current  or ex partner: Not on file    Emotionally abused: Not on file    Physically abused: Not on file    Forced sexual activity: Not on file  Other Topics Concern  . Not on file  Social History Narrative   Coffee daily      Family History  Problem Relation Age of Onset  . Coronary artery disease Father   . Diabetes Father   . Heart disease Father   . Liver disease Mother   . Kidney disease Mother   . Heart disease Mother   . Coronary artery disease Mother   . Heart attack Mother   . Hypertension Mother   . Heart attack Maternal Grandfather   . Colon cancer Neg Hx   . Stroke Neg Hx      Review of Systems: General: negative for chills, fever, night sweats or weight changes.  Cardiovascular: negative for chest pain, dyspnea on exertion, edema, orthopnea, palpitations, paroxysmal nocturnal dyspnea or shortness of breath Dermatological: negative for rash Respiratory: negative for cough or wheezing Urologic: negative for hematuria Abdominal: negative for nausea, vomiting, diarrhea, bright red blood per rectum, melena, or hematemesis Neurologic: negative for visual changes, syncope, or dizziness All other systems reviewed and are otherwise negative except as noted above.    Blood pressure 116/80, pulse 72, height 5\' 9"  (1.753 m), weight 210 lb (95.3 kg).  General appearance: alert, cooperative and no distress Neck: no carotid bruit and no JVD Lungs: clear to auscultation bilaterally Heart: regular rate and rhythm Extremities: extremities normal, atraumatic, no cyanosis or edema Skin: Skin color, texture, turgor normal. No rashes or lesions Neurologic: Grossly normal  EKG NSR  ASSESSMENT AND PLAN:   CAD S/P percutaneous coronary angioplasty Cath for chest pain Sept 2017- 60% RCA noted. Procedure complicated by VF arrest. Continued chest pain led to RCA PCI Oct 2017  Chest tightness Unclear etiology, Dr [name] does not think this is angina  Essential  hypertension Controlled  Dyslipidemia H/O statin intolernace  PAF (paroxysmal atrial fibrillation) [name]) Isolated episode July 2016 Dr [name] recommended he continue ASA. He was given flecainide to be taken as a pill in the pocket.  CHADs VASc=1  VF (ventricular fibrillation) ([name]) During coronary angiogram Sept 2019- required CPR and defibrilation   PLAN  I told him I would discuss statin Rx with dr [name]- f/u 6 months.   Kerin Ransom PA-C 12/06/2017 4:18 PM

## 2017-12-06 NOTE — Assessment & Plan Note (Signed)
H/O statin intolernace

## 2017-12-09 ENCOUNTER — Ambulatory Visit: Payer: BLUE CROSS/BLUE SHIELD | Admitting: Cardiovascular Disease

## 2017-12-29 ENCOUNTER — Telehealth: Payer: Self-pay | Admitting: Cardiovascular Disease

## 2017-12-29 NOTE — Telephone Encounter (Signed)
New Message:       Pt c/o medication issue:  1. Name of Medication: clopidogrel (PLAVIX) 75 MG tablet  2. How are you currently taking this medication (dosage and times per day)? TAKE ONE TABLET BY MOUTH ONCE DAILY  3. Are you having a reaction (difficulty breathing--STAT)? No  4. What is your medication issue? Pt is calling and states and issue with his insurance

## 2017-12-29 NOTE — Telephone Encounter (Signed)
Called patient, he stated that he switched jobs and lost his insurance on June 28th, unable to pay out of pocket for month of July, new insurance picks up Aug 1st. Spoke with pharmacist who gave good RX card to take to [facility] [employer]. Patient verbalized understanding to come by to pick up card up front. Placed in bin. No other questions or concerns.

## 2018-01-23 ENCOUNTER — Telehealth: Payer: Self-pay | Admitting: Cardiovascular Disease

## 2018-01-23 ENCOUNTER — Other Ambulatory Visit: Payer: Self-pay | Admitting: Cardiovascular Disease

## 2018-01-23 ENCOUNTER — Other Ambulatory Visit: Payer: Self-pay

## 2018-01-23 MED ORDER — CLOPIDOGREL BISULFATE 75 MG PO TABS: 75.0000 mg | ORAL_TABLET | Freq: Every day | ORAL | 2 refills | Status: DC

## 2018-01-23 NOTE — Telephone Encounter (Signed)
New Message     *STAT* If patient is at the pharmacy, call can be transferred to refill team.   1. Which medications need to be refilled? (please list name of each medication and dose if known) clopidogrel (PLAVIX) 75 MG tablet    2. Which pharmacy/location (including street and city if local pharmacy) is medication to be sent to? [place], [place]  3. Do they need a 30 day or 90 day supply? 90 day

## 2018-02-21 ENCOUNTER — Encounter: Payer: BLUE CROSS/BLUE SHIELD | Admitting: Internal Medicine

## 2018-02-22 ENCOUNTER — Encounter: Payer: Self-pay | Admitting: Internal Medicine

## 2018-02-22 ENCOUNTER — Ambulatory Visit: Payer: BLUE CROSS/BLUE SHIELD | Admitting: Internal Medicine

## 2018-02-22 VITALS — BP 124/86 | HR 85 | Ht 69.0 in | Wt 218.0 lb

## 2018-02-22 DIAGNOSIS — E785 Hyperlipidemia, unspecified: Secondary | ICD-10-CM

## 2018-02-22 DIAGNOSIS — Z23 Encounter for immunization: Secondary | ICD-10-CM | POA: Diagnosis not present

## 2018-02-22 DIAGNOSIS — R42 Dizziness and giddiness: Secondary | ICD-10-CM | POA: Diagnosis not present

## 2018-02-22 DIAGNOSIS — G5713 Meralgia paresthetica, bilateral lower limbs: Secondary | ICD-10-CM

## 2018-02-22 DIAGNOSIS — E538 Deficiency of other specified B group vitamins: Secondary | ICD-10-CM

## 2018-02-22 NOTE — Patient Instructions (Signed)
Benign Positional Vertigo symptoms on the left. Start [name] - Daroff exercise several times a day as dirrected.

## 2018-02-22 NOTE — Progress Notes (Signed)
Subjective:  Patient ID: [name], male    DOB: 06-27-1955  Age: 62 y.o. MRN: [id number]  CC: No chief complaint on file.   HPI [name] presents for A fib, HTN, BPH f/u C/o B hip and thigh burning pain w/standing and sitting x weeks C/o dizziness  Outpatient Medications Prior to Visit  Medication Sig Dispense Refill  . aspirin EC 81 MG tablet Take 1 tablet (81 mg total) by mouth daily. 30 tablet 0  . clopidogrel (PLAVIX) 75 MG tablet Take 1 tablet (75 mg total) by mouth daily. 90 tablet 2  . cyanocobalamin (COBAL-1000) 1000 MCG/ML injection Vit B12 1000 mcg qd x 4 days, then 1000 mcg weekly x4 then q 2 weeks sq. 10 mL 11  . doxazosin (CARDURA) 8 MG tablet Take 0.5 tablets (4 mg total) by mouth 2 (two) times daily. Needs office visit for further refills 30 tablet 10  . finasteride (PROSCAR) 5 MG tablet TAKE 1 TABLET BY MOUTH ONCE DAILY 90 tablet 3  . isosorbide mononitrate (IMDUR) 30 MG 24 hr tablet TAKE ONE-HALF TABLET BY MOUTH ONCE DAILY 45 tablet 0   No facility-administered medications prior to visit.     ROS: Review of Systems  Constitutional: Negative for appetite change, fatigue and unexpected weight change.  HENT: Negative for congestion, nosebleeds, sneezing, sore throat and trouble swallowing.   Eyes: Negative for itching and visual disturbance.  Respiratory: Negative for cough.   Cardiovascular: Negative for chest pain, palpitations and leg swelling.  Gastrointestinal: Negative for abdominal distention, blood in stool, diarrhea and nausea.  Genitourinary: Negative for frequency and hematuria.  Musculoskeletal: Negative for back pain, gait problem, joint swelling and neck pain.  Skin: Negative for rash.  Neurological: Negative for dizziness, tremors, speech difficulty and weakness.  Psychiatric/Behavioral: Negative for agitation, dysphoric mood, sleep disturbance and suicidal ideas. The patient is not nervous/anxious.     Objective:  BP  124/86 (BP Location: Left Arm, Patient Position: Sitting, Cuff Size: Large)   Pulse 85   Ht 5\' 9"  (1.753 m)   Wt 218 lb (98.9 kg)   SpO2 95%   BMI 32.19 kg/m   BP Readings from Last 3 Encounters:  02/22/18 124/86  12/06/17 116/80  08/19/17 118/82    Wt Readings from Last 3 Encounters:  02/22/18 218 lb (98.9 kg)  12/06/17 210 lb (95.3 kg)  08/19/17 208 lb (94.3 kg)    Physical Exam  Constitutional: He is oriented to person, place, and time. He appears well-developed. No distress.  NAD  HENT:  Mouth/Throat: Oropharynx is clear and moist.  Eyes: Pupils are equal, round, and reactive to light. Conjunctivae are normal.  Neck: Normal range of motion. No JVD present. No thyromegaly present.  Cardiovascular: Normal rate, regular rhythm, normal heart sounds and intact distal pulses. Exam reveals no gallop and no friction rub.  No murmur heard. Pulmonary/Chest: Effort normal and breath sounds normal. No respiratory distress. He has no wheezes. He has no rales. He exhibits no tenderness.  Abdominal: Soft. Bowel sounds are normal. He exhibits no distension and no mass. There is no tenderness. There is no rebound and no guarding.  Musculoskeletal: Normal range of motion. He exhibits no edema or tenderness.  Lymphadenopathy:    He has no cervical adenopathy.  Neurological: He is alert and oriented to person, place, and time. He has normal reflexes. No cranial nerve deficit. He exhibits normal muscle tone. He displays a negative Romberg sign. Coordination and gait normal.  Skin: Skin is warm and dry. No rash noted.  Psychiatric: He has a normal mood and affect. His behavior is normal. Judgment and thought content normal.  oval area of numbness B outer thighs obese  Lab Results  Component Value Date   WBC 7.7 04/15/2017   HGB 15.6 04/15/2017   HCT 44.7 04/15/2017   PLT 241.0 04/15/2017   GLUCOSE 95 04/15/2017   CHOL 186 09/02/2017   TRIG 182 (H) 09/02/2017   HDL 47 09/02/2017    LDLDIRECT 110.0 04/15/2017   LDLCALC 103 (H) 09/02/2017   ALT 42 09/02/2017   AST 26 09/02/2017   NA 137 04/15/2017   K 3.9 04/15/2017   CL 104 04/15/2017   CREATININE 0.96 04/15/2017   BUN 14 04/15/2017   CO2 26 04/15/2017   TSH 1.81 04/15/2017   PSA 0.52 07/30/2015   INR 1.0 03/10/2016   HGBA1C 5.7 07/30/2015    Dg Chest 2 View  Result Date: 05/28/2016 CLINICAL DATA:  Cough and dyspnea exertion. EXAM: CHEST  2 VIEW COMPARISON:  CXR from 11/18/2015, chest CT 01/08/2016 FINDINGS: The heart size and mediastinal contours are within normal limits. The thoracic aorta is slightly uncoiled in appearance without aneurysmal dilatation. Both lungs are clear. Chronic lingular scarring is seen. No acute osseous abnormality. IMPRESSION: No active cardiopulmonary disease.  Chronic lingular scarring. Electronically Signed   By: Ashley Royalty M.D.   On: 05/28/2016 15:43    Assessment & Plan:   Diagnoses and all orders for this visit:  Need for shingles vaccine -     Varicella-zoster vaccine IM     No orders of the defined types were placed in this encounter.    Follow-up: No follow-ups on file.  Walker Kehr, MD

## 2018-02-22 NOTE — Assessment & Plan Note (Signed)
B complex

## 2018-02-22 NOTE — Assessment & Plan Note (Addendum)
Benign Positional Vertigo symptoms on the left. Start [name] - Daroff exercise several times a day as dirrected. Call if better

## 2018-02-22 NOTE — Assessment & Plan Note (Signed)
L>R  Loose wt, loose belt

## 2018-02-24 ENCOUNTER — Other Ambulatory Visit (INDEPENDENT_AMBULATORY_CARE_PROVIDER_SITE_OTHER): Payer: BLUE CROSS/BLUE SHIELD

## 2018-02-24 DIAGNOSIS — E785 Hyperlipidemia, unspecified: Secondary | ICD-10-CM | POA: Diagnosis not present

## 2018-02-24 DIAGNOSIS — G5713 Meralgia paresthetica, bilateral lower limbs: Secondary | ICD-10-CM

## 2018-02-24 DIAGNOSIS — E538 Deficiency of other specified B group vitamins: Secondary | ICD-10-CM | POA: Diagnosis not present

## 2018-02-24 LAB — HEPATIC FUNCTION PANEL
ALT: 59 U/L — ABNORMAL HIGH (ref 0–53)
AST: 31 U/L (ref 0–37)
Albumin: 4.2 g/dL (ref 3.5–5.2)
Alkaline Phosphatase: 61 U/L (ref 39–117)
BILIRUBIN DIRECT: 0.1 mg/dL (ref 0.0–0.3)
TOTAL PROTEIN: 7.1 g/dL (ref 6.0–8.3)
Total Bilirubin: 0.8 mg/dL (ref 0.2–1.2)

## 2018-02-24 LAB — BASIC METABOLIC PANEL
BUN: 13 mg/dL (ref 6–23)
CHLORIDE: 103 meq/L (ref 96–112)
CO2: 27 mEq/L (ref 19–32)
Calcium: 8.9 mg/dL (ref 8.4–10.5)
Creatinine, Ser: 1.11 mg/dL (ref 0.40–1.50)
GFR: 71.77 mL/min (ref >60.00)
Glucose, Bld: 142 mg/dL — ABNORMAL HIGH (ref 70–99)
POTASSIUM: 4.2 meq/L (ref 3.5–5.1)
SODIUM: 137 meq/L (ref 135–145)

## 2018-02-24 LAB — LIPID PANEL
CHOL/HDL RATIO: 4
Cholesterol: 214 mg/dL — ABNORMAL HIGH (ref 0–200)
HDL: 48 mg/dL (ref >39.00)
LDL CALC: 135 mg/dL — AB (ref 0–99)
NonHDL: 166.16
Triglycerides: 154 mg/dL — ABNORMAL HIGH (ref 0.0–149.0)
VLDL: 30.8 mg/dL (ref 0.0–40.0)

## 2018-02-28 ENCOUNTER — Ambulatory Visit (INDEPENDENT_AMBULATORY_CARE_PROVIDER_SITE_OTHER): Payer: BLUE CROSS/BLUE SHIELD

## 2018-02-28 DIAGNOSIS — Z23 Encounter for immunization: Secondary | ICD-10-CM

## 2018-03-21 ENCOUNTER — Encounter: Payer: BLUE CROSS/BLUE SHIELD | Admitting: Internal Medicine

## 2018-05-04 ENCOUNTER — Other Ambulatory Visit: Payer: Self-pay | Admitting: Cardiovascular Disease

## 2018-06-23 ENCOUNTER — Telehealth: Payer: Self-pay | Admitting: Pharmacist Clinician (PhC)/ Clinical Pharmacy Specialist

## 2018-06-23 ENCOUNTER — Encounter: Payer: Self-pay | Admitting: Cardiovascular Disease

## 2018-06-23 ENCOUNTER — Ambulatory Visit: Payer: BLUE CROSS/BLUE SHIELD | Admitting: Cardiovascular Disease

## 2018-06-23 DIAGNOSIS — E785 Hyperlipidemia, unspecified: Secondary | ICD-10-CM

## 2018-06-23 DIAGNOSIS — I2510 Atherosclerotic heart disease of native coronary artery without angina pectoris: Secondary | ICD-10-CM

## 2018-06-23 DIAGNOSIS — Z9861 Coronary angioplasty status: Secondary | ICD-10-CM | POA: Diagnosis not present

## 2018-06-23 DIAGNOSIS — I10 Essential (primary) hypertension: Secondary | ICD-10-CM

## 2018-06-23 NOTE — Patient Instructions (Addendum)
Medication Instructions:  NO CHANGE  If you need a refill on your cardiac medications before your next appointment, please call your pharmacy.   Lab work: LIPID; LIVER If you have labs (blood work) drawn today and your tests are completely normal, you will receive your results only by: Marland Kitchen MyChart Message (if you have MyChart) OR . A paper copy in the mail If you have any lab test that is abnormal or we need to change your treatment, we will call you to review the results.  Testing/Procedures: NONE  Follow-Up: At [facility], you and your health needs are our priority.  As part of our continuing mission to provide you with exceptional heart care, we have created designated Provider Care Teams.  These Care Teams include your primary Cardiologist (physician) and Advanced Practice Providers (APPs -  Physician Assistants and Nurse Practitioners) who all work together to provide you with the care you need, when you need it. You will need a follow up appointment in 12 months.  Please call our office 2 months in advance to schedule this appointment.  You may see DR. BERRY or one of the following Advanced Practice Providers on your designated Care Team:   Kerin Ransom, PA-C [place], [place] . Sande Rives, PA-C  Any Other Special Instructions Will Be Listed Below (If Applicable). REPATHA REFERRAL WITH KRISTIN ALVSTAD, RPH  PLEASE COMPLETE THE CO-PAY FORM FOR THE REPATHA ONLINE  YOU WILL BE CONTACTED BY OUR OFFICE PENDING APPROVAL FOR Ambia

## 2018-06-23 NOTE — Assessment & Plan Note (Signed)
History of CAD status post catheterization by myself via the right radial approach 02/05/2016 revealing a moderately hypodense mid RCA stenosis.  He developed ventricular fibrillation the third coronary injection required CPR CPR and defibrillation.  I performed PCI and stenting on him femoral he 03/10/2016 which was uncomplicated.  He remains on aspirin Plavix.  He denies chest pain or shortness of breath.

## 2018-06-23 NOTE — Telephone Encounter (Signed)
Patient in office to see Dr. Gwenlyn Found today.  Has been intolerant of several statin drugs and has a history of ASCVD with DES  In 2017.  Most recent lipid panel in September showed LDL at 135.  Will arrange for him to repeat lipid labs and then submit information to his insurance for approval.  Patient was given information in his AVS on how to obtain a $5 copay card from [email or web address].

## 2018-06-23 NOTE — Assessment & Plan Note (Signed)
History of dyslipidemia intolerant to statin therapy with lipid profile performed 02/24/2018 revealing a total cholesterol of 214, LDL 135 and HDL 48.  He may be a candidate for Repatha.

## 2018-06-23 NOTE — Assessment & Plan Note (Signed)
History of essential hypertension with blood pressure measured today 136/88.  He is on Cardura current meds at current dosing.

## 2018-06-23 NOTE — Progress Notes (Signed)
06/23/2018 [name]   07/27/1955  [id number]  Primary Physician Plotnikov, Evie Lacks, MD Primary Cardiologist: Lorretta Harp MD [name], [place]  HPI:  [name] is a 63 y.o.  single [place] male whose mother is a patient of mine. I last saw him in the office  05/06/2017. He has a history of treated hypertension, hyperlipidemia and family history for heart disease. He is complained of chest pain off and on for several years. Somewhat exertional. He did have a stress test done in January that was low risk. He's had a persistent cough over the last 6 months. Recent chest CT performed 01/08/16 showed no significant pulmonary abnormalities but there was a small pericardial effusion and calcium in the LAD territory. He underwent outpatient cardiac catheterization on 02/05/16 B right radial approach revealing a normal left system with moderate hypodense mid dominant RCA stenosis. He developed ventricular fibrillation after the third coronary injection requiring CPR and defibrillation. Because of ongoing chest pain I performed PCI and drug-eluting stenting of his mid RCAvia theright femoral approach 03/10/16.The procedurewas uncomplicated. He continues to have atypical chest pain. His PCP related discontinue the statin drug for presumed "statin intolerance". Since I saw him 12 months ago he is remained stable.  He denies chest pain or shortness of breath.  He is statin intolerant with recent lipid profile performed 02/24/2018 revealing total cholesterol 214, LDL 135 and HDL of 48.  Current Meds  Medication Sig  . aspirin EC 81 MG tablet Take 1 tablet (81 mg total) by mouth daily.  . clopidogrel (PLAVIX) 75 MG tablet Take 1 tablet (75 mg total) by mouth daily.  . clopidogrel (PLAVIX) 75 MG tablet TAKE 1 TABLET BY MOUTH ONCE DAILY  . cyanocobalamin (COBAL-1000) 1000 MCG/ML injection Vit B12 1000 mcg qd x 4 days, then 1000 mcg weekly x4 then q 2 weeks sq.  . doxazosin  (CARDURA) 8 MG tablet Take 0.5 tablets (4 mg total) by mouth 2 (two) times daily. Needs office visit for further refills  . finasteride (PROSCAR) 5 MG tablet TAKE 1 TABLET BY MOUTH ONCE DAILY  . isosorbide mononitrate (IMDUR) 30 MG 24 hr tablet TAKE ONE-HALF TABLET BY MOUTH ONCE DAILY     Allergies  Allergen Reactions  . Flomax [Tamsulosin Hcl] Other (See Comments)    Hard time breathing  . Atorvastatin Other (See Comments)    REACTION: leg cramps per patient with a large dose    Social History   Socioeconomic History  . Marital status: Divorced    Spouse name: Not on file  . Number of children: 1  . Years of education: Not on file  . Highest education level: Not on file  Occupational History  . Occupation: Animal nutritionist  Social Needs  . Financial resource strain: Not on file  . Food insecurity:    Worry: Not on file    Inability: Not on file  . Transportation needs:    Medical: Not on file    Non-medical: Not on file  Tobacco Use  . Smoking status: Never Smoker  . Smokeless tobacco: Never Used  Substance and Sexual Activity  . Alcohol use: No    Comment: rare  . Drug use: No  . Sexual activity: Never    Birth control/protection: Abstinence  Lifestyle  . Physical activity:    Days per week: Not on file    Minutes per session: Not on file  . Stress: Not on file  Relationships  .  Social connections:    Talks on phone: Not on file    Gets together: Not on file    Attends religious service: Not on file    Active member of club or organization: Not on file    Attends meetings of clubs or organizations: Not on file    Relationship status: Not on file  . Intimate partner violence:    Fear of current or ex partner: Not on file    Emotionally abused: Not on file    Physically abused: Not on file    Forced sexual activity: Not on file  Other Topics Concern  . Not on file  Social History Narrative   Coffee daily      Review of Systems: General: negative for  chills, fever, night sweats or weight changes.  Cardiovascular: negative for chest pain, dyspnea on exertion, edema, orthopnea, palpitations, paroxysmal nocturnal dyspnea or shortness of breath Dermatological: negative for rash Respiratory: negative for cough or wheezing Urologic: negative for hematuria Abdominal: negative for nausea, vomiting, diarrhea, bright red blood per rectum, melena, or hematemesis Neurologic: negative for visual changes, syncope, or dizziness All other systems reviewed and are otherwise negative except as noted above.    Blood pressure 136/88, pulse 65, height 5\' 9"  (1.753 m), weight 218 lb 9.6 oz (99.2 kg).  General appearance: alert and no distress Neck: no adenopathy, no carotid bruit, no JVD, supple, symmetrical, trachea midline and thyroid not enlarged, symmetric, no tenderness/mass/nodules Lungs: clear to auscultation bilaterally Heart: regular rate and rhythm, S1, S2 normal, no murmur, click, rub or gallop Extremities: extremities normal, atraumatic, no cyanosis or edema Pulses: 2+ and symmetric Skin: Skin color, texture, turgor normal. No rashes or lesions Neurologic: Alert and oriented X 3, normal strength and tone. Normal symmetric reflexes. Normal coordination and gait  EKG sinus rhythm at 65 with a nonspecific IVCD nonspecific ST and T wave changes.  I personally reviewed this EKG.  ASSESSMENT AND PLAN:   Dyslipidemia History of dyslipidemia intolerant to statin therapy with lipid profile performed 02/24/2018 revealing a total cholesterol of 214, LDL 135 and HDL 48.  He may be a candidate for Repatha.  Essential hypertension History of essential hypertension with blood pressure measured today 136/88.  He is on Cardura current meds at current dosing.  CAD S/P percutaneous coronary angioplasty History of CAD status post catheterization by myself via the right radial approach 02/05/2016 revealing a moderately hypodense mid RCA stenosis.  He developed  ventricular fibrillation the third coronary injection required CPR CPR and defibrillation.  I performed PCI and stenting on him femoral he 03/10/2016 which was uncomplicated.  He remains on aspirin Plavix.  He denies chest pain or shortness of breath.      Lorretta Harp MD FACP,FACC,FAHA, [facility] 06/23/2018 4:38 PM

## 2018-07-12 ENCOUNTER — Other Ambulatory Visit: Payer: Self-pay

## 2018-07-12 ENCOUNTER — Telehealth: Payer: Self-pay | Admitting: Cardiovascular Disease

## 2018-07-12 DIAGNOSIS — E785 Hyperlipidemia, unspecified: Secondary | ICD-10-CM

## 2018-07-12 DIAGNOSIS — I2510 Atherosclerotic heart disease of native coronary artery without angina pectoris: Secondary | ICD-10-CM

## 2018-07-12 DIAGNOSIS — Z9861 Coronary angioplasty status: Principal | ICD-10-CM

## 2018-07-12 NOTE — Telephone Encounter (Signed)
New Message          Patient is calling to see if we do a certain type of injection, It's a little hard to understand her.

## 2018-07-12 NOTE — Telephone Encounter (Signed)
Pt states that he has been expecting a call from our office regarding starting [place], but hasn't been contacted since his last OV. Informed pt that message would be routed to pharmD to f/u. Pt agreeable with this

## 2018-07-12 NOTE — Telephone Encounter (Signed)
Pharmacist is currently working on [place]. Will contact patient ASAP with updated information.

## 2018-07-13 LAB — LIPID PANEL
CHOL/HDL RATIO: 4.4 ratio (ref 0.0–5.0)
Cholesterol, Total: 212 mg/dL — ABNORMAL HIGH (ref 100–199)
HDL: 48 mg/dL (ref >39)
LDL Calculated: 111 mg/dL — ABNORMAL HIGH (ref 0–99)
Triglycerides: 263 mg/dL — ABNORMAL HIGH (ref 0–149)
VLDL Cholesterol Cal: 53 mg/dL — ABNORMAL HIGH (ref 5–40)

## 2018-07-19 ENCOUNTER — Telehealth: Payer: Self-pay | Admitting: Cardiovascular Disease

## 2018-07-19 NOTE — Telephone Encounter (Signed)
Routed to CVRR 

## 2018-07-19 NOTE — Telephone Encounter (Signed)
  Colletta [place] with [employer] is checking on the fax sent over regarding the patients Repatha. There are some questions they need answered

## 2018-07-26 ENCOUNTER — Encounter: Payer: Self-pay | Admitting: *Deleted

## 2018-07-31 ENCOUNTER — Ambulatory Visit: Payer: Self-pay

## 2018-07-31 NOTE — Telephone Encounter (Signed)
ret'd call to pt. Reported he has had intermittent abdominal pain for 2-3 days.  Stated the pain is located "on left side under my rib cage".  Had difficult time clarifying with pt. the location, due to language barrier.  Nurse understood by pt's description that the pain is in left upper quadrant of abdomen.  Stated the pain comes and goes and is worse when he goes from sitting to standing, or when bearing down to have a BM.  Stated the pain is "much less today, compared to yesterday."  Denied nausea/ vomiting, fever, change in bowel pattern.  Stated "my bowel movements are good."  Reported "some bloating" at intervals; denied any bloating at present time.  Offered pt. An appt. With NP tomorrow.  Requested to only see his PCP.  Advised that Dr. Alain Marion doesn't have appt. Opening until 2/12.  Requested the appt. 2/12 @ 3:00 PM.  Care advice given per protocol.  Advised to call office if symptoms worsen prior to his appt. On 2/12.  Verb. Understanding.    Reason for Disposition . [1] MODERATE pain (e.g., interferes with normal activities) AND [2] pain comes and goes (cramps) AND [3] present > 24 hours  (Exception: pain with Vomiting or Diarrhea - see that Guideline)  Answer Assessment - Initial Assessment Questions 1. LOCATION: "Where does it hurt?"      Under the ribs on the left side 2. RADIATION: "Does the pain shoot anywhere else?" (e.g., chest, back)     denied 3. ONSET: "When did the pain begin?" (Minutes, hours or days ago)     2-3 days ago 4. SUDDEN: "Gradual or sudden onset?"     Sudden  5. PATTERN "Does the pain come and go, or is it constant?"    - If constant: "Is it getting better, staying the same, or worsening?"      (Note: Constant means the pain never goes away completely; most serious pain is constant and it progresses)     - If intermittent: "How long does it last?" "Do you have pain now?"     (Note: Intermittent means the pain goes away completely between bouts)  Intermittent during a BM 6. SEVERITY: "How bad is the pain?"  (e.g., Scale 1-10; mild, moderate, or severe)    - MILD (1-3): doesn't interfere with normal activities, abdomen soft and not tender to touch     - MODERATE (4-7): interferes with normal activities or awakens from sleep, tender to touch     - SEVERE (8-10): excruciating pain, doubled over, unable to do any normal activities       Strong pain yesterday; but today it is less 7. RECURRENT SYMPTOM: "Have you ever had this type of abdominal pain before?" If so, ask: "When was the last time?" and "What happened that time?"      Never before  8. CAUSE: "What do you think is causing the abdominal pain?"     Unknown  9. RELIEVING/AGGRAVATING FACTORS: "What makes it better or worse?" (e.g., movement, antacids, bowel movement)     Aggravated by moving a certain way or when moving bowels; reported some bloating, denied nausea or vomiting, denied fever.   10. OTHER SYMPTOMS: "Has there been any vomiting, diarrhea, constipation, or urine problems?"       Denied any of the above.  Protocols used: ABDOMINAL PAIN - MALE-A-AH

## 2018-08-01 ENCOUNTER — Other Ambulatory Visit: Payer: Self-pay | Admitting: Internal Medicine

## 2018-08-01 DIAGNOSIS — R0789 Other chest pain: Secondary | ICD-10-CM

## 2018-08-02 ENCOUNTER — Other Ambulatory Visit: Payer: Self-pay | Admitting: Internal Medicine

## 2018-08-02 ENCOUNTER — Ambulatory Visit (INDEPENDENT_AMBULATORY_CARE_PROVIDER_SITE_OTHER)
Admission: RE | Admit: 2018-08-02 | Discharge: 2018-08-02 | Disposition: A | Discharge status: Home / Self Care | Payer: BLUE CROSS/BLUE SHIELD | Source: Ambulatory Visit | Attending: Internal Medicine | Admitting: Internal Medicine

## 2018-08-02 ENCOUNTER — Ambulatory Visit: Payer: BLUE CROSS/BLUE SHIELD | Admitting: Internal Medicine

## 2018-08-02 DIAGNOSIS — R0789 Other chest pain: Secondary | ICD-10-CM

## 2018-08-02 NOTE — Telephone Encounter (Signed)
Pt has appt with Dr Camila Li today at 3 pm

## 2018-08-04 ENCOUNTER — Other Ambulatory Visit: Payer: Self-pay | Admitting: Cardiovascular Disease

## 2018-08-07 ENCOUNTER — Telehealth: Payer: Self-pay | Admitting: Pharmacist Clinician (PhC)/ Clinical Pharmacy Specialist

## 2018-08-07 ENCOUNTER — Ambulatory Visit: Payer: BLUE CROSS/BLUE SHIELD

## 2018-08-07 MED ORDER — EVOLOCUMAB 140 MG/ML ~~LOC~~ SOAJ: 140.0000 mg | SUBCUTANEOUS | 12 refills | Status: DC

## 2018-08-07 NOTE — Telephone Encounter (Signed)
Repatha  Patient information gathered and submitted to [facility] for prior authorization of REPATHA .  Prior authorization approved by [facility] on 07/21/2018 and will expire on 07/16/2019  Patient was notified of approval, prescription called to [facility].  Patient is aware that they will need to contact their pharmacy to determine copay.  If this is cost prohibitive, patient will need to call back to our office.    Copay at local pharmacy $5 - eligible for copay card  First injection 08/07/2018

## 2018-08-08 ENCOUNTER — Ambulatory Visit: Payer: BLUE CROSS/BLUE SHIELD | Admitting: Internal Medicine

## 2018-08-14 ENCOUNTER — Ambulatory Visit: Payer: BLUE CROSS/BLUE SHIELD

## 2018-08-15 ENCOUNTER — Ambulatory Visit: Payer: BLUE CROSS/BLUE SHIELD

## 2018-11-06 ENCOUNTER — Other Ambulatory Visit: Payer: Self-pay | Admitting: Cardiovascular Disease

## 2018-11-07 ENCOUNTER — Other Ambulatory Visit: Payer: Self-pay | Admitting: Cardiovascular Disease

## 2018-11-07 MED ORDER — CLOPIDOGREL BISULFATE 75 MG PO TABS: 75.0000 mg | ORAL_TABLET | Freq: Every day | ORAL | 1 refills | Status: DC

## 2018-11-07 NOTE — Telephone Encounter (Signed)
°*  STAT* If patient is at the pharmacy, call can be transferred to refill team.   1. Which medications need to be refilled? (please list name of each medication and dose if known)  clopidogrel (PLAVIX) 75 MG tablet  2. Which pharmacy/location (including street and city if local pharmacy) is medication to be sent to? [place], [place]  3. Do they need a 30 day or 90 day supply? [place]

## 2018-11-07 NOTE — Telephone Encounter (Signed)
Clopidogrel refilled

## 2018-11-08 ENCOUNTER — Other Ambulatory Visit: Payer: Self-pay

## 2018-11-08 MED ORDER — CLOPIDOGREL BISULFATE 75 MG PO TABS: 75.0000 mg | ORAL_TABLET | Freq: Every day | ORAL | 1 refills | Status: DC

## 2018-12-20 NOTE — Telephone Encounter (Signed)
This encounter was created in error - please disregard.

## 2019-05-09 ENCOUNTER — Encounter: Payer: Self-pay | Admitting: Internal Medicine

## 2019-07-11 ENCOUNTER — Telehealth: Payer: Self-pay

## 2019-07-11 NOTE — Telephone Encounter (Signed)
lmom for pt to call us back with his insurance information

## 2019-07-11 NOTE — Telephone Encounter (Signed)
Used the interpreter to call and lmom the pt for drug insurance information b/c I missed the pt's call back

## 2019-07-24 ENCOUNTER — Encounter: Payer: Self-pay | Admitting: Cardiovascular Disease

## 2019-07-24 ENCOUNTER — Other Ambulatory Visit: Payer: Self-pay

## 2019-07-24 ENCOUNTER — Ambulatory Visit (INDEPENDENT_AMBULATORY_CARE_PROVIDER_SITE_OTHER): Payer: 59 | Admitting: Cardiovascular Disease

## 2019-07-24 VITALS — BP 119/75 | HR 72 | Ht 69.0 in | Wt 214.0 lb

## 2019-07-24 DIAGNOSIS — I480 Paroxysmal atrial fibrillation: Secondary | ICD-10-CM

## 2019-07-24 DIAGNOSIS — Z9861 Coronary angioplasty status: Secondary | ICD-10-CM

## 2019-07-24 DIAGNOSIS — R0789 Other chest pain: Secondary | ICD-10-CM | POA: Diagnosis not present

## 2019-07-24 DIAGNOSIS — I2510 Atherosclerotic heart disease of native coronary artery without angina pectoris: Secondary | ICD-10-CM | POA: Diagnosis not present

## 2019-07-24 DIAGNOSIS — I10 Essential (primary) hypertension: Secondary | ICD-10-CM

## 2019-07-24 DIAGNOSIS — E785 Hyperlipidemia, unspecified: Secondary | ICD-10-CM | POA: Diagnosis not present

## 2019-07-24 MED ORDER — AMLODIPINE BESYLATE 5 MG PO TABS: 5.0000 mg | ORAL_TABLET | Freq: Every day | ORAL | 3 refills | Status: DC

## 2019-07-24 NOTE — Assessment & Plan Note (Signed)
History of 1 episode of PAF back in July 2016 spontaneously converting to sinus rhythm.  He saw Dr. Lovena Le at the time who recommended flecainide "pill in the pocket".  Since that time he has had coronary intervention making that class of drugs no longer applicable.

## 2019-07-24 NOTE — Assessment & Plan Note (Signed)
History of CAD status post RCA intervention but via the right radial approach initially complicated by VF.  This was terminated and he was brought back via the femoral approach 03/10/2016 with stenting of his mid RCA.  He had no other significant CAD and has been relatively asymptomatic since except for some mild exertional chest pain which is not changed in frequency or severity.  He remains on aspirin 81 mg a day.

## 2019-07-24 NOTE — Assessment & Plan Note (Signed)
History of dyslipidemia on Repatha.  We will recheck a lipid liver profile.

## 2019-07-24 NOTE — Progress Notes (Signed)
07/24/2019 [name]   April 14, 1956  [id number]  Primary Physician Plotnikov, Evie Lacks, MD Primary Cardiologist: Lorretta Harp MD [name], [place]  HPI:  [name] is a 64 y.o.  single [place] male whose mother is a patient of mine. I last saw him in the office  06/23/2018. He has a history of treated hypertension, hyperlipidemia and family history for heart disease. He is complained of chest pain off and on for several years. Somewhat exertional. He did have a stress test done in January that was low risk. He's had a persistent cough over the last 6 months. Recent chest CT performed 01/08/16 showed no significant pulmonary abnormalities but there was a small pericardial effusion and calcium in the LAD territory. He underwent outpatient cardiac catheterization on 02/05/16 B right radial approach revealing a normal left system with moderate hypodense mid dominant RCA stenosis. He developed ventricular fibrillation after the third coronary injection requiring CPR and defibrillation. Because of ongoing chest pain I performed PCI and drug-eluting stenting of his mid RCAvia theright femoral approach 03/10/16.The procedurewas uncomplicated. He continues to have atypical chest pain. His PCP related discontinue the statin drug for presumed "statin intolerance".  Since I saw him a year ago he is remained stable.  He does have some mild exertional substernal chest pain which is no change in frequency and severity of last year.  Otherwise he denies shortness of breath.  He remains on a baby aspirin.  He has not had any recurrent episodes of PAF since his previous episode in 2016   Current Meds  Medication Sig  . amLODipine (NORVASC) 5 MG tablet Take 1 tablet (5 mg total) by mouth daily.  Marland Kitchen aspirin EC 81 MG tablet Take 1 tablet (81 mg total) by mouth daily.  . cyanocobalamin (COBAL-1000) 1000 MCG/ML injection Vit B12 1000 mcg qd x 4 days, then 1000 mcg weekly x4 then q 2  weeks sq.  . doxazosin (CARDURA) 8 MG tablet Take 0.5 tablets (4 mg total) by mouth 2 (two) times daily. Needs office visit for further refills  . Evolocumab (REPATHA SURECLICK) [id number] MG/ML SOAJ Inject 140 mg into the skin every 14 (fourteen) days.  . finasteride (PROSCAR) 5 MG tablet TAKE 1 TABLET BY MOUTH ONCE DAILY  . [DISCONTINUED] amLODipine (NORVASC) 2.5 MG tablet Take 2.5 mg by mouth daily.     Allergies  Allergen Reactions  . Flomax [Tamsulosin Hcl] Other (See Comments)    Hard time breathing  . Atorvastatin Other (See Comments)    REACTION: leg cramps per patient with a large dose    Social History   Socioeconomic History  . Marital status: Divorced    Spouse name: Not on file  . Number of children: 1  . Years of education: Not on file  . Highest education level: Not on file  Occupational History  . Occupation: Animal nutritionist  Tobacco Use  . Smoking status: Never Smoker  . Smokeless tobacco: Never Used  Substance and Sexual Activity  . Alcohol use: No    Comment: rare  . Drug use: No  . Sexual activity: Never    Birth control/protection: Abstinence  Other Topics Concern  . Not on file  Social History Narrative   Coffee daily    Social Determinants of Health   Financial Resource Strain:   . Difficulty of Paying Living Expenses: Not on file  Food Insecurity:   . Worried About Charity fundraiser in the Last  Year: Not on file  . Ran Out of Food in the Last Year: Not on file  Transportation Needs:   . Lack of Transportation (Medical): Not on file  . Lack of Transportation (Non-Medical): Not on file  Physical Activity:   . Days of Exercise per Week: Not on file  . Minutes of Exercise per Session: Not on file  Stress:   . Feeling of Stress : Not on file  Social Connections:   . Frequency of Communication with Friends and Family: Not on file  . Frequency of Social Gatherings with Friends and Family: Not on file  . Attends Religious Services: Not on file  .  Active Member of Clubs or Organizations: Not on file  . Attends Archivist Meetings: Not on file  . Marital Status: Not on file  Intimate Partner Violence:   . Fear of Current or Ex-Partner: Not on file  . Emotionally Abused: Not on file  . Physically Abused: Not on file  . Sexually Abused: Not on file     Review of Systems: General: negative for chills, fever, night sweats or weight changes.  Cardiovascular: negative for chest pain, dyspnea on exertion, edema, orthopnea, palpitations, paroxysmal nocturnal dyspnea or shortness of breath Dermatological: negative for rash Respiratory: negative for cough or wheezing Urologic: negative for hematuria Abdominal: negative for nausea, vomiting, diarrhea, bright red blood per rectum, melena, or hematemesis Neurologic: negative for visual changes, syncope, or dizziness All other systems reviewed and are otherwise negative except as noted above.    Blood pressure 119/75, pulse 72, height 5\' 9"  (1.753 m), weight 214 lb (97.1 kg), SpO2 97 %.  General appearance: alert and no distress Neck: no adenopathy, no carotid bruit, no JVD, supple, symmetrical, trachea midline and thyroid not enlarged, symmetric, no tenderness/mass/nodules Lungs: clear to auscultation bilaterally Heart: regular rate and rhythm, S1, S2 normal, no murmur, click, rub or gallop Extremities: extremities normal, atraumatic, no cyanosis or edema Pulses: 2+ and symmetric Skin: Skin color, texture, turgor normal. No rashes or lesions Neurologic: Alert and oriented X 3, normal strength and tone. Normal symmetric reflexes. Normal coordination and gait  EKG sinus rhythm 72 with nonspecific ST and T wave changes.  Personally reviewed this EKG.  ASSESSMENT AND PLAN:   Dyslipidemia History of dyslipidemia on Repatha.  We will recheck a lipid liver profile.  Essential hypertension History of essential hypertension blood pressure measured today 119/75.  He says his blood  pressures been running somewhat higher than this in the mornings at home.  He is on amlodipine 2.5 mg a day.  We will increase this to 5 mg a day.  PAF (paroxysmal atrial fibrillation) ([place]) History of 1 episode of PAF back in July 2016 spontaneously converting to sinus rhythm.  He saw Dr. Lovena Le at the time who recommended flecainide "pill in the pocket".  Since that time he has had coronary intervention making that class of drugs no longer applicable.  CAD S/P percutaneous coronary angioplasty History of CAD status post RCA intervention but via the right radial approach initially complicated by VF.  This was terminated and he was brought back via the femoral approach 03/10/2016 with stenting of his mid RCA.  He had no other significant CAD and has been relatively asymptomatic since except for some mild exertional chest pain which is not changed in frequency or severity.  He remains on aspirin 81 mg a day.      Lorretta Harp MD FACP,FACC,FAHA, [facility] 07/24/2019 11:30 AM

## 2019-07-24 NOTE — Patient Instructions (Signed)
Medication Instructions:  Increase Amlodipine to 5mg  daily If you need a refill on your cardiac medications before your next appointment, please call your pharmacy.   Lab work: Fasting Lipids and Hepatic Function If you have labs (blood work) drawn today and your tests are completely normal, you will receive your results only by: MyChart Message (if you have MyChart) OR A paper copy in the mail If you have any lab test that is abnormal or we need to change your treatment, we will call you to review the results.  Testing/Procedures: NONE  Follow-Up: At [facility], you and your health needs are our priority.  As part of our continuing mission to provide you with exceptional heart care, we have created designated Provider Care Teams.  These Care Teams include your primary Cardiologist (physician) and Advanced Practice Providers (APPs -  Physician Assistants and Nurse Practitioners) who all work together to provide you with the care you need, when you need it. You may see Dr. Gwenlyn Found or one of the following Advanced Practice Providers on your designated Care Team:    Kerin Ransom, PA-C  [place], [place]  Coletta Memos, [place]  Your physician wants you to follow-up in: 1 year with Dr. Gwenlyn Found

## 2019-07-24 NOTE — Assessment & Plan Note (Signed)
History of essential hypertension blood pressure measured today 119/75.  He says his blood pressures been running somewhat higher than this in the mornings at home.  He is on amlodipine 2.5 mg a day.  We will increase this to 5 mg a day.

## 2019-07-25 ENCOUNTER — Other Ambulatory Visit: Payer: Self-pay | Admitting: Internal Medicine

## 2019-07-25 MED ORDER — DOXAZOSIN MESYLATE 8 MG PO TABS: 4.0000 mg | ORAL_TABLET | Freq: Two times a day (BID) | ORAL | 11 refills | Status: DC

## 2019-07-27 LAB — HEPATIC FUNCTION PANEL
ALT: 65 IU/L — ABNORMAL HIGH (ref 0–44)
AST: 39 IU/L (ref 0–40)
Albumin: 4.1 g/dL (ref 3.8–4.8)
Alkaline Phosphatase: 67 IU/L (ref 39–117)
Bilirubin Total: 0.6 mg/dL (ref 0.0–1.2)
Bilirubin, Direct: 0.21 mg/dL (ref 0.00–0.40)
Total Protein: 6.6 g/dL (ref 6.0–8.5)

## 2019-07-27 LAB — LIPID PANEL
Chol/HDL Ratio: 2.2 ratio (ref 0.0–5.0)
Cholesterol, Total: 123 mg/dL (ref 100–199)
HDL: 57 mg/dL (ref >39)
LDL Chol Calc (NIH): 45 mg/dL (ref 0–99)
Triglycerides: 121 mg/dL (ref 0–149)
VLDL Cholesterol Cal: 21 mg/dL (ref 5–40)

## 2019-08-08 ENCOUNTER — Encounter: Payer: 59 | Admitting: Internal Medicine

## 2019-08-08 ENCOUNTER — Other Ambulatory Visit: Payer: Self-pay

## 2019-08-13 ENCOUNTER — Telehealth: Payer: Self-pay

## 2019-08-13 NOTE — Telephone Encounter (Signed)
Called and lmomed the pt stating that we needed updated insurance information

## 2019-08-28 ENCOUNTER — Encounter: Payer: Self-pay | Admitting: Internal Medicine

## 2019-08-28 ENCOUNTER — Ambulatory Visit (INDEPENDENT_AMBULATORY_CARE_PROVIDER_SITE_OTHER): Payer: 59 | Admitting: Internal Medicine

## 2019-08-28 ENCOUNTER — Other Ambulatory Visit: Payer: Self-pay

## 2019-08-28 VITALS — BP 118/72 | HR 79 | Temp 98.5°F | Ht 69.0 in | Wt 213.0 lb

## 2019-08-28 DIAGNOSIS — K219 Gastro-esophageal reflux disease without esophagitis: Secondary | ICD-10-CM

## 2019-08-28 DIAGNOSIS — J45991 Cough variant asthma: Secondary | ICD-10-CM

## 2019-08-28 DIAGNOSIS — N320 Bladder-neck obstruction: Secondary | ICD-10-CM

## 2019-08-28 DIAGNOSIS — R1013 Epigastric pain: Secondary | ICD-10-CM | POA: Diagnosis not present

## 2019-08-28 DIAGNOSIS — I480 Paroxysmal atrial fibrillation: Secondary | ICD-10-CM

## 2019-08-28 DIAGNOSIS — Z0000 Encounter for general adult medical examination without abnormal findings: Secondary | ICD-10-CM

## 2019-08-28 DIAGNOSIS — E538 Deficiency of other specified B group vitamins: Secondary | ICD-10-CM

## 2019-08-28 DIAGNOSIS — E559 Vitamin D deficiency, unspecified: Secondary | ICD-10-CM

## 2019-08-28 DIAGNOSIS — E785 Hyperlipidemia, unspecified: Secondary | ICD-10-CM

## 2019-08-28 DIAGNOSIS — I2510 Atherosclerotic heart disease of native coronary artery without angina pectoris: Secondary | ICD-10-CM

## 2019-08-28 DIAGNOSIS — R635 Abnormal weight gain: Secondary | ICD-10-CM

## 2019-08-28 DIAGNOSIS — I10 Essential (primary) hypertension: Secondary | ICD-10-CM

## 2019-08-28 MED ORDER — PANCRELIPASE (LIP-PROT-AMYL) 36000-114000 UNITS PO CPEP: 36000.0000 [IU] | ORAL_CAPSULE | Freq: Three times a day (TID) | ORAL | 3 refills | Status: DC

## 2019-08-28 MED ORDER — PANTOPRAZOLE SODIUM 40 MG PO TBEC: 40.0000 mg | DELAYED_RELEASE_TABLET | Freq: Every day | ORAL | 3 refills | Status: DC

## 2019-08-28 NOTE — Assessment & Plan Note (Signed)
On B12 

## 2019-08-28 NOTE — Assessment & Plan Note (Signed)
BP Readings from Last 3 Encounters:  08/28/19 118/72  07/24/19 119/75  06/23/18 136/88

## 2019-08-28 NOTE — Assessment & Plan Note (Signed)
Creon Protonix

## 2019-08-28 NOTE — Assessment & Plan Note (Signed)
We discussed age appropriate health related issues, including available/recomended screening tests and vaccinations. We discussed a need for adhering to healthy diet and exercise. Labs were ordered to be later reviewed . All questions were answered.   

## 2019-08-28 NOTE — Assessment & Plan Note (Signed)
No relapse 

## 2019-08-28 NOTE — Assessment & Plan Note (Addendum)
Statin intolerant Re-start Repatha

## 2019-08-28 NOTE — Progress Notes (Signed)
Subjective:  Patient ID: [name], male    DOB: 23-Oct-1955  Age: 64 y.o. MRN: [id number]  CC: No chief complaint on file.   HPI ARVIND SEAY presents for a well exam C/o dyspepsia sx's, chronic cough, GERD Comes w/mother  Outpatient Medications Prior to Visit  Medication Sig Dispense Refill  . amLODipine (NORVASC) 5 MG tablet Take 1 tablet (5 mg total) by mouth daily. 90 tablet 3  . aspirin EC 81 MG tablet Take 1 tablet (81 mg total) by mouth daily. 30 tablet 0  . cyanocobalamin (COBAL-1000) 1000 MCG/ML injection Vit B12 1000 mcg qd x 4 days, then 1000 mcg weekly x4 then q 2 weeks sq. 10 mL 11  . doxazosin (CARDURA) 8 MG tablet Take 0.5 tablets (4 mg total) by mouth 2 (two) times daily. Needs office visit for further refills 30 tablet 11  . Evolocumab (REPATHA SURECLICK) XX123456 MG/ML SOAJ Inject 140 mg into the skin every 14 (fourteen) days. 2 pen 12  . finasteride (PROSCAR) 5 MG tablet TAKE 1 TABLET BY MOUTH ONCE DAILY 90 tablet 3   No facility-administered medications prior to visit.    ROS: Review of Systems  Constitutional: Positive for diaphoresis. Negative for appetite change, fatigue and unexpected weight change.  HENT: Negative for congestion, nosebleeds, sneezing, sore throat and trouble swallowing.   Eyes: Negative for itching and visual disturbance.  Respiratory: Positive for cough. Negative for shortness of breath.   Cardiovascular: Negative for chest pain, palpitations and leg swelling.  Gastrointestinal: Negative for abdominal distention, blood in stool, diarrhea and nausea.  Genitourinary: Negative for frequency and hematuria.  Musculoskeletal: Negative for back pain, gait problem, joint swelling and neck pain.  Skin: Negative for rash.  Neurological: Negative for dizziness, tremors, speech difficulty and weakness.  Psychiatric/Behavioral: Negative for agitation, dysphoric mood and sleep disturbance. The patient is not nervous/anxious.      Objective:  BP 118/72 (BP Location: Left Arm, Patient Position: Sitting, Cuff Size: Large)   Pulse 79   Temp 98.5 F (36.9 C) (Oral)   Ht 5\' 9"  (1.753 m)   Wt 213 lb (96.6 kg)   SpO2 95%   BMI 31.45 kg/m   BP Readings from Last 3 Encounters:  08/28/19 118/72  07/24/19 119/75  06/23/18 136/88    Wt Readings from Last 3 Encounters:  08/28/19 213 lb (96.6 kg)  07/24/19 214 lb (97.1 kg)  06/23/18 218 lb 9.6 oz (99.2 kg)    Physical Exam Constitutional:      General: He is not in acute distress.    Appearance: He is well-developed.     Comments: NAD  Eyes:     Conjunctiva/sclera: Conjunctivae normal.     Pupils: Pupils are equal, round, and reactive to light.  Neck:     Thyroid: No thyromegaly.     Vascular: No JVD.  Cardiovascular:     Rate and Rhythm: Normal rate and regular rhythm.     Heart sounds: Normal heart sounds. No murmur. No friction rub. No gallop.   Pulmonary:     Effort: Pulmonary effort is normal. No respiratory distress.     Breath sounds: Normal breath sounds. No wheezing, rhonchi or rales.  Chest:     Chest wall: No tenderness.  Abdominal:     General: Bowel sounds are normal. There is no distension.     Palpations: Abdomen is soft. There is no mass.     Tenderness: There is no abdominal tenderness. There is no  guarding or rebound.  Musculoskeletal:        General: No tenderness. Normal range of motion.     Cervical back: Normal range of motion.  Lymphadenopathy:     Cervical: No cervical adenopathy.  Skin:    General: Skin is warm and dry.     Findings: No rash.  Neurological:     Mental Status: He is alert and oriented to person, place, and time.     Cranial Nerves: No cranial nerve deficit.     Motor: No abnormal muscle tone.     Coordination: Coordination normal.     Gait: Gait normal.     Deep Tendon Reflexes: Reflexes are normal and symmetric.  Psychiatric:        Behavior: Behavior normal.        Thought Content: Thought  content normal.        Judgment: Judgment normal.   Rectal NE  Lab Results  Component Value Date   WBC 7.7 04/15/2017   HGB 15.6 04/15/2017   HCT 44.7 04/15/2017   PLT 241.0 04/15/2017   GLUCOSE 142 (H) 02/24/2018   CHOL 123 07/27/2019   TRIG 121 07/27/2019   HDL 57 07/27/2019   LDLDIRECT 110.0 04/15/2017   LDLCALC 45 07/27/2019   ALT 65 (H) 07/27/2019   AST 39 07/27/2019   NA 137 02/24/2018   K 4.2 02/24/2018   CL 103 02/24/2018   CREATININE 1.11 02/24/2018   BUN 13 02/24/2018   CO2 27 02/24/2018   TSH 1.81 04/15/2017   PSA 0.52 07/30/2015   INR 1.0 03/10/2016   HGBA1C 5.7 07/30/2015    DG Chest 2 View  Result Date: 08/02/2018 CLINICAL DATA:  Left-sided chest pain with cough for several days, initial encounter EXAM: CHEST - 2 VIEW COMPARISON:  05/28/2016 FINDINGS: Cardiac shadow is within normal limits. Mild scarring is noted in the left base stable the previous exam. No acute bony abnormality is noted. No focal infiltrate is seen. IMPRESSION: No acute abnormality noted. Electronically Signed   By: Inez Catalina M.D.   On: 08/02/2018 22:13   DG Ribs Unilateral Left  Result Date: 08/02/2018 CLINICAL DATA:  Left-sided chest pain, no known injury, initial encounter EXAM: LEFT RIBS - 2 VIEW COMPARISON:  None. FINDINGS: No fracture or other bone lesions are seen involving the ribs. IMPRESSION: No acute abnormality noted. Electronically Signed   By: Inez Catalina M.D.   On: 08/02/2018 22:15    Assessment & Plan:   Diagnoses and all orders for this visit:  Well adult exam  CAD S/P percutaneous coronary angioplasty  Dyslipidemia  B12 deficiency  PAF (paroxysmal atrial fibrillation) ([place])  Essential hypertension  Other orders -     pantoprazole (PROTONIX) 40 MG tablet; Take 1 tablet (40 mg total) by mouth daily. -     lipase/protease/amylase (CREON) 36000 UNITS CPEP capsule; Take 1 capsule (36,000 Units total) by mouth 3 (three) times daily before meals. Take with the  first bite of food     Meds ordered this encounter  Medications  . pantoprazole (PROTONIX) 40 MG tablet    Sig: Take 1 tablet (40 mg total) by mouth daily.    Dispense:  90 tablet    Refill:  3  . lipase/protease/amylase (CREON) 36000 UNITS CPEP capsule    Sig: Take 1 capsule (36,000 Units total) by mouth 3 (three) times daily before meals. Take with the first bite of food    Dispense:  180 capsule  Refill:  3     Follow-up: Return in about 6 months (around 02/28/2020) for a follow-up visit.  Walker Kehr, MD

## 2019-08-28 NOTE — Progress Notes (Signed)
Subjective:  Patient ID: [name], male    DOB: 09-25-1955  Age: 64 y.o. MRN: [id number]  CC: No chief complaint on file.   HPI [name] presents for a well exam C/o GERD, dyspepsia  Outpatient Medications Prior to Visit  Medication Sig Dispense Refill  . amLODipine (NORVASC) 5 MG tablet Take 1 tablet (5 mg total) by mouth daily. 90 tablet 3  . aspirin EC 81 MG tablet Take 1 tablet (81 mg total) by mouth daily. 30 tablet 0  . cyanocobalamin (COBAL-1000) 1000 MCG/ML injection Vit B12 1000 mcg qd x 4 days, then 1000 mcg weekly x4 then q 2 weeks sq. 10 mL 11  . doxazosin (CARDURA) 8 MG tablet Take 0.5 tablets (4 mg total) by mouth 2 (two) times daily. Needs office visit for further refills 30 tablet 11  . Evolocumab (REPATHA SURECLICK) [id number] MG/ML SOAJ Inject 140 mg into the skin every 14 (fourteen) days. 2 pen 12  . finasteride (PROSCAR) 5 MG tablet TAKE 1 TABLET BY MOUTH ONCE DAILY 90 tablet 3   No facility-administered medications prior to visit.    ROS: Review of Systems  Constitutional: Negative for appetite change, fatigue and unexpected weight change.  HENT: Negative for congestion, nosebleeds, sneezing, sore throat and trouble swallowing.   Eyes: Negative for itching and visual disturbance.  Respiratory: Negative for cough.   Cardiovascular: Positive for chest pain. Negative for palpitations and leg swelling.  Gastrointestinal: Negative for abdominal distention, blood in stool, diarrhea and nausea.  Genitourinary: Negative for frequency and hematuria.  Musculoskeletal: Positive for arthralgias and back pain. Negative for gait problem, joint swelling and neck pain.  Skin: Negative for rash.  Neurological: Negative for dizziness, tremors, speech difficulty and weakness.  Psychiatric/Behavioral: Negative for agitation, dysphoric mood and sleep disturbance. The patient is not nervous/anxious.     Objective:  BP 118/72 (BP Location: Left Arm, Patient  Position: Sitting, Cuff Size: Large)   Pulse 79   Temp 98.5 F (36.9 C) (Oral)   Ht 5\' 9"  (1.753 m)   Wt 213 lb (96.6 kg)   SpO2 95%   BMI 31.45 kg/m   BP Readings from Last 3 Encounters:  08/28/19 118/72  07/24/19 119/75  06/23/18 136/88    Wt Readings from Last 3 Encounters:  08/28/19 213 lb (96.6 kg)  07/24/19 214 lb (97.1 kg)  06/23/18 218 lb 9.6 oz (99.2 kg)    Physical Exam Constitutional:      General: He is not in acute distress.    Appearance: Normal appearance. He is well-developed.     Comments: NAD  Eyes:     Conjunctiva/sclera: Conjunctivae normal.     Pupils: Pupils are equal, round, and reactive to light.  Neck:     Thyroid: No thyromegaly.     Vascular: No JVD.  Cardiovascular:     Rate and Rhythm: Normal rate and regular rhythm.     Heart sounds: Normal heart sounds. No murmur. No friction rub. No gallop.   Pulmonary:     Effort: Pulmonary effort is normal. No respiratory distress.     Breath sounds: Normal breath sounds. No wheezing or rales.  Chest:     Chest wall: No tenderness.  Abdominal:     General: Bowel sounds are normal. There is no distension.     Palpations: Abdomen is soft. There is no mass.     Tenderness: There is no abdominal tenderness. There is no guarding or rebound.  Musculoskeletal:  General: No tenderness. Normal range of motion.     Cervical back: Normal range of motion.  Lymphadenopathy:     Cervical: No cervical adenopathy.  Skin:    General: Skin is warm and dry.     Findings: No rash.  Neurological:     Mental Status: He is alert and oriented to person, place, and time.     Cranial Nerves: No cranial nerve deficit.     Motor: No abnormal muscle tone.     Coordination: Coordination normal.     Gait: Gait normal.     Deep Tendon Reflexes: Reflexes are normal and symmetric.  Psychiatric:        Behavior: Behavior normal.        Thought Content: Thought content normal.        Judgment: Judgment normal.      Lab Results  Component Value Date   WBC 7.7 04/15/2017   HGB 15.6 04/15/2017   HCT 44.7 04/15/2017   PLT 241.0 04/15/2017   GLUCOSE 142 (H) 02/24/2018   CHOL 123 07/27/2019   TRIG 121 07/27/2019   HDL 57 07/27/2019   LDLDIRECT 110.0 04/15/2017   LDLCALC 45 07/27/2019   ALT 65 (H) 07/27/2019   AST 39 07/27/2019   NA 137 02/24/2018   K 4.2 02/24/2018   CL 103 02/24/2018   CREATININE 1.11 02/24/2018   BUN 13 02/24/2018   CO2 27 02/24/2018   TSH 1.81 04/15/2017   PSA 0.52 07/30/2015   INR 1.0 03/10/2016   HGBA1C 5.7 07/30/2015    DG Chest 2 View  Result Date: 08/02/2018 CLINICAL DATA:  Left-sided chest pain with cough for several days, initial encounter EXAM: CHEST - 2 VIEW COMPARISON:  05/28/2016 FINDINGS: Cardiac shadow is within normal limits. Mild scarring is noted in the left base stable the previous exam. No acute bony abnormality is noted. No focal infiltrate is seen. IMPRESSION: No acute abnormality noted. Electronically Signed   By: Inez Catalina M.D.   On: 08/02/2018 22:13   DG Ribs Unilateral Left  Result Date: 08/02/2018 CLINICAL DATA:  Left-sided chest pain, no known injury, initial encounter EXAM: LEFT RIBS - 2 VIEW COMPARISON:  None. FINDINGS: No fracture or other bone lesions are seen involving the ribs. IMPRESSION: No acute abnormality noted. Electronically Signed   By: Inez Catalina M.D.   On: 08/02/2018 22:15    Assessment & Plan:    Follow-up: No follow-ups on file.  Walker Kehr, MD

## 2019-08-28 NOTE — Assessment & Plan Note (Addendum)
ASA Amlodipine Call Cardiology re: restarting Repatha

## 2019-08-28 NOTE — Assessment & Plan Note (Addendum)
Treat GERD, asthma CXR if needed

## 2019-08-28 NOTE — Assessment & Plan Note (Signed)
Protonix.  ?

## 2019-08-28 NOTE — Assessment & Plan Note (Signed)
Wt Readings from Last 3 Encounters:  08/28/19 213 lb (96.6 kg)  07/24/19 214 lb (97.1 kg)  06/23/18 218 lb 9.6 oz (99.2 kg)  resolved

## 2019-08-29 ENCOUNTER — Other Ambulatory Visit (INDEPENDENT_AMBULATORY_CARE_PROVIDER_SITE_OTHER): Payer: 59

## 2019-08-29 DIAGNOSIS — N320 Bladder-neck obstruction: Secondary | ICD-10-CM | POA: Diagnosis not present

## 2019-08-29 DIAGNOSIS — Z0000 Encounter for general adult medical examination without abnormal findings: Secondary | ICD-10-CM | POA: Diagnosis not present

## 2019-08-29 DIAGNOSIS — I480 Paroxysmal atrial fibrillation: Secondary | ICD-10-CM | POA: Diagnosis not present

## 2019-08-29 DIAGNOSIS — E559 Vitamin D deficiency, unspecified: Secondary | ICD-10-CM

## 2019-08-29 DIAGNOSIS — E538 Deficiency of other specified B group vitamins: Secondary | ICD-10-CM | POA: Diagnosis not present

## 2019-08-29 LAB — CBC WITH DIFFERENTIAL/PLATELET
Basophils Absolute: 0 10*3/uL (ref 0.0–0.1)
Basophils Relative: 0.6 % (ref 0.0–3.0)
Eosinophils Absolute: 0.2 10*3/uL (ref 0.0–0.7)
Eosinophils Relative: 2.1 % (ref 0.0–5.0)
HCT: 42.1 % (ref 39.0–52.0)
Hemoglobin: 14.5 g/dL (ref 13.0–17.0)
Lymphocytes Relative: 27.9 % (ref 12.0–46.0)
Lymphs Abs: 2.2 10*3/uL (ref 0.7–4.0)
MCHC: 34.5 g/dL (ref 30.0–36.0)
MCV: 90.4 fl (ref 78.0–100.0)
Monocytes Absolute: 0.7 10*3/uL (ref 0.1–1.0)
Monocytes Relative: 9.4 % (ref 3.0–12.0)
Neutro Abs: 4.7 10*3/uL (ref 1.4–7.7)
Neutrophils Relative %: 60 % (ref 43.0–77.0)
Platelets: 217 10*3/uL (ref 150.0–400.0)
RBC: 4.66 Mil/uL (ref 4.22–5.81)
RDW: 12.8 % (ref 11.5–15.5)
WBC: 7.8 10*3/uL (ref 4.0–10.5)

## 2019-08-29 LAB — URINALYSIS
Bilirubin Urine: NEGATIVE
Hgb urine dipstick: NEGATIVE
Ketones, ur: NEGATIVE
Leukocytes,Ua: NEGATIVE
Nitrite: NEGATIVE
Specific Gravity, Urine: >=1.03 — AB (ref 1.000–1.030)
Total Protein, Urine: NEGATIVE
Urine Glucose: NEGATIVE
Urobilinogen, UA: 0.2 (ref 0.0–1.0)
pH: 5.5 (ref 5.0–8.0)

## 2019-08-29 LAB — BASIC METABOLIC PANEL
BUN: 13 mg/dL (ref 6–23)
CO2: 28 mEq/L (ref 19–32)
Calcium: 9.4 mg/dL (ref 8.4–10.5)
Chloride: 103 mEq/L (ref 96–112)
Creatinine, Ser: 1.02 mg/dL (ref 0.40–1.50)
GFR: 74.07 mL/min (ref >60.00)
Glucose, Bld: 90 mg/dL (ref 70–99)
Potassium: 4.1 mEq/L (ref 3.5–5.1)
Sodium: 138 mEq/L (ref 135–145)

## 2019-08-29 LAB — VITAMIN D 25 HYDROXY (VIT D DEFICIENCY, FRACTURES): VITD: 22.61 ng/mL — ABNORMAL LOW (ref 30.00–100.00)

## 2019-08-29 LAB — VITAMIN B12: Vitamin B-12: 287 pg/mL (ref 211–911)

## 2019-08-29 LAB — PSA: PSA: 0.11 ng/mL (ref 0.10–4.00)

## 2019-08-29 LAB — TSH: TSH: 1.81 u[IU]/mL (ref 0.35–4.50)

## 2019-08-31 ENCOUNTER — Other Ambulatory Visit: Payer: Self-pay | Admitting: Internal Medicine

## 2019-08-31 MED ORDER — VITAMIN D3 50 MCG (2000 UT) PO CAPS: 2000.0000 [IU] | ORAL_CAPSULE | Freq: Every day | ORAL | 3 refills | Status: AC

## 2019-08-31 MED ORDER — VITAMIN D3 1.25 MG (50000 UT) PO CAPS: 1.0000 | ORAL_CAPSULE | ORAL | 0 refills | Status: DC

## 2019-08-31 MED ORDER — B COMPLEX PO TABS: 1.0000 | ORAL_TABLET | Freq: Every day | ORAL | 3 refills | Status: DC

## 2020-01-09 ENCOUNTER — Other Ambulatory Visit: Payer: Self-pay

## 2020-01-09 ENCOUNTER — Ambulatory Visit (INDEPENDENT_AMBULATORY_CARE_PROVIDER_SITE_OTHER): Payer: 59 | Admitting: Pulmonary Disease

## 2020-01-09 ENCOUNTER — Ambulatory Visit (INDEPENDENT_AMBULATORY_CARE_PROVIDER_SITE_OTHER): Payer: 59

## 2020-01-09 ENCOUNTER — Encounter: Payer: Self-pay | Admitting: Pulmonary Disease

## 2020-01-09 VITALS — BP 110/64 | HR 87 | Temp 98.4°F | Ht 69.0 in | Wt 212.4 lb

## 2020-01-09 DIAGNOSIS — R05 Cough: Secondary | ICD-10-CM

## 2020-01-09 DIAGNOSIS — R053 Chronic cough: Secondary | ICD-10-CM

## 2020-01-09 MED ORDER — ALBUTEROL SULFATE HFA 108 (90 BASE) MCG/ACT IN AERS
2.0000 | INHALATION_SPRAY | Freq: Four times a day (QID) | RESPIRATORY_TRACT | 3 refills | Status: DC | PRN
Start: 2020-01-09 — End: 2020-01-10

## 2020-01-09 MED ORDER — BENZONATATE 200 MG PO CAPS: 200.0000 mg | ORAL_CAPSULE | Freq: Three times a day (TID) | ORAL | 1 refills | Status: DC | PRN

## 2020-01-09 NOTE — Progress Notes (Signed)
[facility]  Chief Complaint  Patient presents with  . Consult    Cough    Constitutional:  BP 110/64 (BP Location: Left Arm, Cuff Size: Normal)   Pulse 87   Temp 98.4 F (36.9 C) (Oral)   Ht 5\' 9"  (1.753 m)   Wt 212 lb 6.4 oz (96.3 kg)   SpO2 95% Comment: RA  BMI 31.37 kg/m   Past Medical History:  A fib, CAD s/p DES, GERD, HA, HLD, Nephrolithiasis  Summary:  [name] is a 64 y.o. male with cough.  Subjective:   He was previously seen by Dr. Corrie Dandy.  He is originally from [place], but moved to [place] at the age of 64.    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  His cough was worse then.  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  Never smoked.  He had Pfizer COVID vaccine.  His cough got worse after that.  He was using a pill from [place] for cough and this helped some.  Also used claritin and cough improved some.  Gets dry cough and intermittent wheeze.  Not feeling short of breath.  Denies fever, skin rash, reflux, or chest pain. No animal bird exposures.  Interview conducted with [place].  Physical Exam:   Appearance - well kempt  ENMT - no sinus tenderness, no nasal discharge, no oral exudate, Mallampati 3  Respiratory - no wheeze, or rales  CV - regular rate and rhythm, no murmurs  GI - soft, non tender  Lymph - no adenopathy noted in neck  Ext - no edema  Skin - no rashes  Neuro - normal strength, oriented x 3  Psych - normal mood and affect  Discussion:  He has chronic cough.  Reports symptoms have gotten worse since he complete [place] vaccination.  There is no history of cigarette smoking.  Concern is for asthma.  Assessment/Plan:   Chronic cough. - will arrange for chest xray, PFT, and FeNO - prn albuterol, tessalon - additional interventions based on test results   A total of  36 minutes spent  addressing patient care issues on day of visit.  Follow up:  Patient Instructions  Chest xray today  Will schedule pulmonary function test and FeNO breathing test  Albuterol two puffs every 4 to 6 hours as needed for cough  Tessalon 200 mg every 8 hours as needed for cough  Follow up in 4 to 6 weeks with Dr. Halford Chessman   Signature:  Chesley Mires, MD [place] Pager: [phone] 01/09/2020, 10:06 AM  Flow Sheet     Pulmonary tests:   PPD 03/13/15 >> negative  RAST 01/02/16 >> negative, IgE 49  PFT 04/07/16 >> FEV1 3.54 (100%), FEV1% 83, TLC 6.37 (93%), DLCO 64%  Chest imaging:   CT chest 01/08/16 >> atherosclerosis, scarring RLL and lingula  Cardiac tests:   Echo 04/20/16 >> EF 60 to 65%  Medications:   Allergies as of 01/09/2020      Reactions   Flomax [tamsulosin Hcl] Other (See Comments)   Hard time breathing   Atorvastatin Other (See Comments)   REACTION: leg cramps per patient with a large dose      Medication List       Accurate as of January 09, 2020 10:06 AM. If you have any questions, ask your nurse or doctor.  albuterol 108 (90 Base) MCG/ACT inhaler Commonly known as: ProAir HFA Inhale 2 puffs into the lungs every 6 (six) hours as needed for wheezing or shortness of breath. Started by: Chesley Mires, MD   amLODipine 5 MG tablet Commonly known as: NORVASC Take 1 tablet (5 mg total) by mouth daily.   aspirin EC 81 MG tablet Take 1 tablet (81 mg total) by mouth daily.   b complex vitamins tablet Take 1 tablet by mouth daily.   benzonatate 200 MG capsule Commonly known as: TESSALON Take 1 capsule (200 mg total) by mouth 3 (three) times daily as needed for cough. Started by: Chesley Mires, MD   cyanocobalamin 1000 MCG/ML injection Commonly known as: Cobal-1000 Vit B12 1000 mcg qd x 4 days, then 1000 mcg weekly x4 then q 2 weeks sq.   doxazosin 8 MG tablet Commonly known as: CARDURA Take 0.5 tablets (4 mg total) by mouth  2 (two) times daily. Needs office visit for further refills   Evolocumab 140 MG/ML Soaj Commonly known as: Repatha SureClick Inject 885 mg into the skin every 14 (fourteen) days.   finasteride 5 MG tablet Commonly known as: PROSCAR TAKE 1 TABLET BY MOUTH ONCE DAILY   lipase/protease/amylase 36000 UNITS Cpep capsule Commonly known as: Creon Take 1 capsule (36,000 Units total) by mouth 3 (three) times daily before meals. Take with the first bite of food   pantoprazole 40 MG tablet Commonly known as: PROTONIX Take 1 tablet (40 mg total) by mouth daily.   Vitamin D3 1.25 MG (50000 UT) Caps Take 1 capsule by mouth once a week.   Vitamin D3 50 MCG (2000 UT) capsule Take 1 capsule (2,000 Units total) by mouth daily.       Past Surgical History:  He  has a past surgical history that includes Cardiac catheterization (N/A, 02/05/2016); Cardiac catheterization (N/A, 03/18/2016); Coronary angioplasty; Tonsillectomy; and Inguinal hernia repair (Bilateral).  Family History:  His family history includes Coronary artery disease in his father and mother; Diabetes in his father; Heart attack in his maternal grandfather and mother; Heart disease in his father and mother; Hypertension in his mother; Kidney disease in his mother; Liver disease in his mother.  Social History:  He  reports that he has never smoked. He has never used smokeless tobacco. He reports that he does not drink alcohol and does not use drugs.

## 2020-01-09 NOTE — Patient Instructions (Signed)
Chest xray today  Will schedule pulmonary function test and FeNO breathing test  Albuterol two puffs every 4 to 6 hours as needed for cough  Tessalon 200 mg every 8 hours as needed for cough  Follow up in 4 to 6 weeks with Dr. Halford Chessman

## 2020-01-10 ENCOUNTER — Telehealth: Payer: Self-pay | Admitting: Pulmonary Disease

## 2020-01-10 MED ORDER — ALBUTEROL SULFATE HFA 108 (90 BASE) MCG/ACT IN AERS: 2.0000 | INHALATION_SPRAY | Freq: Four times a day (QID) | RESPIRATORY_TRACT | 5 refills | Status: DC | PRN

## 2020-01-10 MED ORDER — BENZONATATE 200 MG PO CAPS: 200.0000 mg | ORAL_CAPSULE | Freq: Three times a day (TID) | ORAL | 5 refills | Status: DC | PRN

## 2020-01-10 NOTE — Telephone Encounter (Signed)
DG Chest 2 View  Result Date: 01/10/2020 CLINICAL DATA:  Chronic cough. EXAM: CHEST - 2 VIEW COMPARISON:  August 02, 2018. FINDINGS: The heart size and mediastinal contours are within normal limits. Both lungs are clear. The visualized skeletal structures are unremarkable. IMPRESSION: No active cardiopulmonary disease. Electronically Signed   By: Marijo Conception M.D.   On: 01/10/2020 10:49     Please let him know his chest xray is normal.

## 2020-01-10 NOTE — Telephone Encounter (Signed)
Called patient to confirm that medications were resent to preferred pharmacy, see MyChart encounter.

## 2020-01-10 NOTE — Telephone Encounter (Signed)
lmom 

## 2020-02-05 ENCOUNTER — Other Ambulatory Visit: Payer: Self-pay | Admitting: Internal Medicine

## 2020-02-05 MED ORDER — FINASTERIDE 5 MG PO TABS: 5.0000 mg | ORAL_TABLET | Freq: Every day | ORAL | 3 refills | Status: DC

## 2020-02-05 NOTE — Progress Notes (Signed)
Needs finasteride prescription -done.

## 2020-02-06 ENCOUNTER — Encounter: Payer: Self-pay | Admitting: Primary Care

## 2020-02-06 ENCOUNTER — Ambulatory Visit (INDEPENDENT_AMBULATORY_CARE_PROVIDER_SITE_OTHER): Payer: 59 | Admitting: Primary Care

## 2020-02-06 ENCOUNTER — Ambulatory Visit (INDEPENDENT_AMBULATORY_CARE_PROVIDER_SITE_OTHER): Payer: 59 | Admitting: Pulmonary Disease

## 2020-02-06 ENCOUNTER — Other Ambulatory Visit: Payer: Self-pay

## 2020-02-06 DIAGNOSIS — R05 Cough: Secondary | ICD-10-CM

## 2020-02-06 DIAGNOSIS — J45991 Cough variant asthma: Secondary | ICD-10-CM

## 2020-02-06 DIAGNOSIS — R053 Chronic cough: Secondary | ICD-10-CM

## 2020-02-06 LAB — PULMONARY FUNCTION TEST
DL/VA % pred: 79 %
DL/VA: 3.36 ml/min/mmHg/L
DLCO cor % pred: 83 %
DLCO cor: 22.18 ml/min/mmHg
DLCO unc % pred: 83 %
DLCO unc: 22.18 ml/min/mmHg
FEF 25-75 Post: 4.57 L/sec
FEF 25-75 Pre: 4.12 L/sec
FEF2575-%Change-Post: 10 %
FEF2575-%Pred-Post: 164 %
FEF2575-%Pred-Pre: 148 %
FEV1-%Change-Post: 2 %
FEV1-%Pred-Post: 114 %
FEV1-%Pred-Pre: 111 %
FEV1-Post: 3.92 L
FEV1-Pre: 3.81 L
FEV1FVC-%Change-Post: 2 %
FEV1FVC-%Pred-Pre: 110 %
FEV6-%Change-Post: 0 %
FEV6-%Pred-Post: 106 %
FEV6-%Pred-Pre: 106 %
FEV6-Post: 4.61 L
FEV6-Pre: 4.6 L
FEV6FVC-%Pred-Post: 105 %
FEV6FVC-%Pred-Pre: 105 %
FVC-%Change-Post: 0 %
FVC-%Pred-Post: 101 %
FVC-%Pred-Pre: 101 %
FVC-Post: 4.61 L
FVC-Pre: 4.6 L
Post FEV1/FVC ratio: 85 %
Post FEV6/FVC ratio: 100 %
Pre FEV1/FVC ratio: 83 %
Pre FEV6/FVC Ratio: 100 %
RV % pred: 106 %
RV: 2.38 L
TLC % pred: 109 %
TLC: 7.45 L

## 2020-02-06 MED ORDER — LORATADINE 10 MG PO TABS: 10.0000 mg | ORAL_TABLET | Freq: Every day | ORAL | 11 refills | Status: DC

## 2020-02-06 MED ORDER — FLUTICASONE PROPIONATE 50 MCG/ACT NA SUSP
1.0000 | Freq: Every day | NASAL | 2 refills | Status: DC
Start: 2020-02-06 — End: 2020-07-16

## 2020-02-06 NOTE — Progress Notes (Signed)
Reviewed and agree with assessment/plan.   Chesley Mires, MD [facility] 02/06/2020, 4:48 PM Pager:  [phone]

## 2020-02-06 NOTE — Progress Notes (Signed)
PFT done today. 

## 2020-02-06 NOTE — Patient Instructions (Addendum)
Recommendations: - Take Protonix first thing in the morning before breakfast x 4 weeks or until better - Start Flonase 1 spray per nostril in morning x 4 weeks or until better ( you can get this over the counter) - Start Claritin 10mg  daily (you can get this over the counter)  Follow-up: As needed if symptoms do not improve or worsen   _____________________________________________________________________________   ????????????: - ?????????? Protonix ????? ????? ????????? x 4 ?????? ??? ?? ????????? - ??????? ????? Flonase 1 ? ?????? ????? x 4 ?????? ??? ?? ????????? (?? ?????? ???????? ??? ??? ???????). - ??????? ????????? ???????? 10 ?? ? ???? (?? ?????? ???????? ??? ??? ???????).  ????????? ??: ??? ?????????????, ???? ???????? ?? ?????????? ??? ?? ?????????? Rekomendatsii: - Prinimayte Protonix utrom pered zavtrakom x 4 nedeli ili do uluchsheniya - Nachnite sprey Flonase 1 v nozdryu utrom x 4 nedeli ili do uluchsheniya (vy mozhete poluchit' eto bez retsepta). - Nachnite prinimat' Klaritin 10 mg v Arminda Resides' (vy mozhete poluchit' yego bez retsepta).  Sledovat' za: Pri neobkhodimosti, yesli simptomy ne uluchshayutsya ili ne ukhudshayutsya

## 2020-02-06 NOTE — Assessment & Plan Note (Signed)
-   Cough is more consistent with upper airway cough syndrome likely driven by underlying PND and/or GERD. No associated dyspnea, wheezing or chest tightness  - PFTs today showed normal spirometry and diffusion capacity. No bronchodilator response  - CXR showed clear lungs - Recommend Protonix 40mg  daily first thing in the morning; Start Claritin 10mg  daily and Flonase 1 spray per nostril daily x 4 weeks or until better  - Follow-up as needed if symptoms do not improve or worsen

## 2020-02-06 NOTE — Progress Notes (Signed)
@Patient  ID: [name], male    DOB: May 23, 1956, 64 y.o.   MRN: [id number]  Chief Complaint  Patient presents with  . Follow-up    reports productive cough x2-3 months. denies dyspnea and states last albuterol use was about 3 weeks ago    Referring provider: Plotnikov, Evie Lacks, MD  HPI: 64 year old male, never smoked. PMH significant for cough variant asthma, GERD, Afib, CAD, GERD, dyslipidemia. Former patient of Dr. Murlean Iba, seen by Dr. Halford Chessman for initial consult on 01/09/20 for cough. Ordered for CXR, PFTs, FENO. RX albuterol hfa and tessalon perles.  02/06/2020 Patient presents today for 4-6 week follow-up with PFTs. Accompanied by interpreter. He is doing well, reports productive cough x 2-3 months. No associate dyspnea, chest tightness or wheezing. He does report improvement with tesslon perles. He does not take Protonix on a regular basis. CXR showed clear lungs. Pulmonary function testings today showed normal spirometry with no BD response. Unable to due FENO today d/t equipment limitations.  Pulmonary function testing: 02/06/2020 - FVC 4.61 (101%), FEV1 3.92 (114%), ratio 85, TLC 109, DLCO 22.18 (83%) Interpretation: Normal spirometry and diffusion capacity   Imaging:  01/09/20 CXR- Clear lungs; no active cardiopulmonary disease   Allergies  Allergen Reactions  . Flomax [Tamsulosin Hcl] Other (See Comments)    Hard time breathing  . Atorvastatin Other (See Comments)    REACTION: leg cramps per patient with a large dose    Immunization History  Administered Date(s) Administered  . Influenza, Seasonal, Injecte, Preservative Fre 03/11/2014  . Influenza,inj,Quad PF,6+ Mos 03/23/2016, 05/04/2016, 03/04/2017, 02/28/2018, 03/08/2019  . Influenza-Unspecified 03/25/2015, 03/04/2017  . PPD Test 02/04/2014, 03/10/2015  . Pneumococcal Conjugate-13 04/22/2017  . Td 08/25/2009  . Zoster Recombinat (Shingrix) 02/22/2018, 09/04/2018    Past Medical History:  Diagnosis  Date  . Atrial fibrillation ([place])   . CAD (coronary artery disease)    a.cath 01/2016: 60% stenosis RCA. Went into VF during procedure requiring CPR and defibrillation. No intervention performed at that time b.cath 02/2016: 3 mm x 15 mm Xience DES to prox-RCA   . Coronary artery disease   . GERD (gastroesophageal reflux disease)   . Headache(784.0)   . Hyperlipidemia   . Kidney stones    "passed it" (03/18/2016)  . URI (upper respiratory infection)     Tobacco History: Social History   Tobacco Use  Smoking Status Never Smoker  Smokeless Tobacco Never Used   Counseling given: Not Answered   Outpatient Medications Prior to Visit  Medication Sig Dispense Refill  . albuterol (PROAIR HFA) 108 (90 Base) MCG/ACT inhaler Inhale 2 puffs into the lungs every 6 (six) hours as needed for wheezing or shortness of breath. 18 g 5  . amLODipine (NORVASC) 5 MG tablet Take 1 tablet (5 mg total) by mouth daily. 90 tablet 3  . aspirin EC 81 MG tablet Take 1 tablet (81 mg total) by mouth daily. 30 tablet 0  . b complex vitamins tablet Take 1 tablet by mouth daily. 100 tablet 3  . benzonatate (TESSALON) 200 MG capsule Take 1 capsule (200 mg total) by mouth 3 (three) times daily as needed for cough. 30 capsule 5  . Cholecalciferol (VITAMIN D3) 1.25 MG (50000 UT) CAPS Take 1 capsule by mouth once a week. 6 capsule 0  . Cholecalciferol (VITAMIN D3) 50 MCG (2000 UT) capsule Take 1 capsule (2,000 Units total) by mouth daily. 100 capsule 3  . cyanocobalamin (COBAL-1000) 1000 MCG/ML injection Vit B12 1000 mcg  qd x 4 days, then 1000 mcg weekly x4 then q 2 weeks sq. 10 mL 11  . doxazosin (CARDURA) 8 MG tablet Take 0.5 tablets (4 mg total) by mouth 2 (two) times daily. Needs office visit for further refills 30 tablet 11  . finasteride (PROSCAR) 5 MG tablet Take 1 tablet (5 mg total) by mouth daily. 90 tablet 3  . lipase/protease/amylase (CREON) 36000 UNITS CPEP capsule Take 1 capsule (36,000 Units total) by mouth  3 (three) times daily before meals. Take with the first bite of food 180 capsule 3  . pantoprazole (PROTONIX) 40 MG tablet Take 1 tablet (40 mg total) by mouth daily. 90 tablet 3  . Evolocumab (REPATHA SURECLICK) 295 MG/ML SOAJ Inject 140 mg into the skin every 14 (fourteen) days. (Patient not taking: Reported on 02/06/2020) 2 pen 12   No facility-administered medications prior to visit.    Review of Systems  Review of Systems  Constitutional: Negative.   HENT: Negative.  Negative for congestion and postnasal drip.   Respiratory: Positive for cough. Negative for chest tightness, shortness of breath and wheezing.   Cardiovascular: Negative.     Physical Exam  BP 118/66 (BP Location: Right Arm, Cuff Size: Normal)   Pulse 83   Temp 97.7 F (36.5 C)   Ht 5\' 9"  (1.753 m)   Wt 214 lb (97.1 kg)   SpO2 96%   BMI 31.60 kg/m  Physical Exam Constitutional:      Appearance: Normal appearance.  HENT:     Head: Normocephalic and atraumatic.     Mouth/Throat:     Mouth: Mucous membranes are moist.     Pharynx: Oropharynx is clear.  Cardiovascular:     Rate and Rhythm: Normal rate and regular rhythm.  Pulmonary:     Effort: Pulmonary effort is normal.     Breath sounds: Normal breath sounds.  Musculoskeletal:        General: Normal range of motion.  Skin:    General: Skin is warm and dry.  Neurological:     General: No focal deficit present.     Mental Status: He is alert and oriented to person, place, and time. Mental status is at baseline.  Psychiatric:        Mood and Affect: Mood normal.        Behavior: Behavior normal.        Thought Content: Thought content normal.        Judgment: Judgment normal.      Lab Results:  CBC    Component Value Date/Time   WBC 7.8 08/29/2019 1316   RBC 4.66 08/29/2019 1316   HGB 14.5 08/29/2019 1316   HCT 42.1 08/29/2019 1316   PLT 217.0 08/29/2019 1316   MCV 90.4 08/29/2019 1316   MCV 93.0 09/15/2013 1158   MCH 27.5 03/19/2016  0340   MCHC 34.5 08/29/2019 1316   RDW 12.8 08/29/2019 1316   LYMPHSABS 2.2 08/29/2019 1316   MONOABS 0.7 08/29/2019 1316   EOSABS 0.2 08/29/2019 1316   BASOSABS 0.0 08/29/2019 1316    BMET    Component Value Date/Time   NA 138 08/29/2019 1316   K 4.1 08/29/2019 1316   CL 103 08/29/2019 1316   CO2 28 08/29/2019 1316   GLUCOSE 90 08/29/2019 1316   BUN 13 08/29/2019 1316   CREATININE 1.02 08/29/2019 1316   CREATININE 0.96 03/10/2016 0829   CALCIUM 9.4 08/29/2019 1316   GFRNONAA >60 03/19/2016 0340   GFRAA >60 03/19/2016  0340    BNP No results found for: BNP  ProBNP No results found for: PROBNP  Imaging: DG Chest 2 View  Result Date: 01/10/2020 CLINICAL DATA:  Chronic cough. EXAM: CHEST - 2 VIEW COMPARISON:  August 02, 2018. FINDINGS: The heart size and mediastinal contours are within normal limits. Both lungs are clear. The visualized skeletal structures are unremarkable. IMPRESSION: No active cardiopulmonary disease. Electronically Signed   By: Marijo Conception M.D.   On: 01/10/2020 10:49     Assessment & Plan:   Cough variant asthma vs UACS - Cough is more consistent with upper airway cough syndrome likely driven by underlying PND and/or GERD. No associated dyspnea, wheezing or chest tightness  - PFTs today showed normal spirometry and diffusion capacity. No bronchodilator response  - CXR showed clear lungs - Recommend Protonix 40mg  daily first thing in the morning; Start Claritin 10mg  daily and Flonase 1 spray per nostril daily x 4 weeks or until better  - Follow-up as needed if symptoms do not improve or worsen     Martyn Ehrich, NP 02/06/2020

## 2020-02-12 ENCOUNTER — Other Ambulatory Visit: Payer: Self-pay

## 2020-02-12 ENCOUNTER — Encounter: Payer: Self-pay | Admitting: Cardiovascular Disease

## 2020-02-12 ENCOUNTER — Ambulatory Visit (INDEPENDENT_AMBULATORY_CARE_PROVIDER_SITE_OTHER): Payer: 59 | Admitting: Cardiovascular Disease

## 2020-02-12 VITALS — BP 92/70 | HR 77 | Ht 69.0 in | Wt 212.4 lb

## 2020-02-12 DIAGNOSIS — I2510 Atherosclerotic heart disease of native coronary artery without angina pectoris: Secondary | ICD-10-CM | POA: Diagnosis not present

## 2020-02-12 DIAGNOSIS — Z9861 Coronary angioplasty status: Secondary | ICD-10-CM | POA: Diagnosis not present

## 2020-02-12 DIAGNOSIS — I10 Essential (primary) hypertension: Secondary | ICD-10-CM | POA: Diagnosis not present

## 2020-02-12 DIAGNOSIS — E785 Hyperlipidemia, unspecified: Secondary | ICD-10-CM

## 2020-02-12 NOTE — Assessment & Plan Note (Signed)
History of CAD status post RCA intervention by myself 03/10/2016.  He did have a cath 02/05/2016 and had ventricular fibrillation after the third coronary injection on the right.  He continues to have occasional atypical chest pain which has not changed in frequency or severity.

## 2020-02-12 NOTE — Patient Instructions (Signed)
Medication Instructions:  Your physician recommends that you continue on your current medications as directed. Please refer to the Current Medication list given to you today.  *If you need a refill on your cardiac medications before your next appointment, please call your pharmacy*   Lab Work: RETURN FOR FASTING LAB WORK (Lipid, Hepatic)  If you have labs (blood work) drawn today and your tests are completely normal, you will receive your results only by: Marland Kitchen MyChart Message (if you have MyChart) OR . A paper copy in the mail If you have any lab test that is abnormal or we need to change your treatment, we will call you to review the results.  Follow-Up: At [facility], you and your health needs are our priority.  As part of our continuing mission to provide you with exceptional heart care, we have created designated Provider Care Teams.  These Care Teams include your primary Cardiologist (physician) and Advanced Practice Providers (APPs -  Physician Assistants and Nurse Practitioners) who all work together to provide you with the care you need, when you need it.  We recommend signing up for the patient portal called "MyChart".  Sign up information is provided on this After Visit Summary.  MyChart is used to connect with patients for Virtual Visits (Telemedicine).  Patients are able to view lab/test results, encounter notes, upcoming appointments, etc.  Non-urgent messages can be sent to your provider as well.   To learn more about what you can do with MyChart, go to [email or web address].    Your next appointment:   12 month(s)  The format for your next appointment:   In Person  Provider:   Quay Burow, MD   Other Instructions

## 2020-02-12 NOTE — Progress Notes (Signed)
02/12/2020 [name]   [birth year]  [id number]  Primary Physician Plotnikov, Evie Lacks, MD Primary Cardiologist: Lorretta Harp MD [name], [place]  HPI:  [name] is a 64 y.o.  single [place] male whose mother is a patient of mine. I last saw him in the office 07/24/2019. He has a history of treated hypertension, hyperlipidemia and family history for heart disease. He is complained of chest pain off and on for several years. Somewhat exertional. He did have a stress test done in January that was low risk. He's had a persistent cough over the last 6 months. Recent chest CT performed 01/08/16 showed no significant pulmonary abnormalities but there was a small pericardial effusion and calcium in the LAD territory. He underwent outpatient cardiac catheterization on 02/05/16 B right radial approach revealing a normal left system with moderate hypodense mid dominant RCA stenosis. He developed ventricular fibrillation after the third coronary injection requiring CPR and defibrillation. Because of ongoing chest pain I performed PCI and drug-eluting stenting of his mid RCAvia theright femoral approach 03/10/16.The procedurewas uncomplicated. He continues to have atypical chest pain. His PCP related discontinue the statin drug for presumed "statin intolerance".   He does have some mild exertional substernal chest pain which is no change in frequency and severity over the prior year.  Since I saw him 6 months ago he was on Repatha which resulted in excellent lipid profile liver he no longer takes this because of financial constraints.  Continues to have occasional atypical chest pain.  Current Meds  Medication Sig  . albuterol (PROAIR HFA) 108 (90 Base) MCG/ACT inhaler Inhale 2 puffs into the lungs every 6 (six) hours as needed for wheezing or shortness of breath.  Marland Kitchen amLODipine (NORVASC) 5 MG tablet Take 1 tablet (5 mg total) by mouth daily.  Marland Kitchen aspirin EC 81 MG tablet  Take 1 tablet (81 mg total) by mouth daily.  Marland Kitchen b complex vitamins tablet Take 1 tablet by mouth daily.  . benzonatate (TESSALON) 200 MG capsule Take 1 capsule (200 mg total) by mouth 3 (three) times daily as needed for cough.  . Cholecalciferol (VITAMIN D3) 1.25 MG (50000 UT) CAPS Take 1 capsule by mouth once a week.  . Cholecalciferol (VITAMIN D3) 50 MCG (2000 UT) capsule Take 1 capsule (2,000 Units total) by mouth daily.  . cyanocobalamin (COBAL-1000) 1000 MCG/ML injection Vit B12 1000 mcg qd x 4 days, then 1000 mcg weekly x4 then q 2 weeks sq.  . doxazosin (CARDURA) 8 MG tablet Take 0.5 tablets (4 mg total) by mouth 2 (two) times daily. Needs office visit for further refills  . finasteride (PROSCAR) 5 MG tablet Take 1 tablet (5 mg total) by mouth daily.  . fluticasone (FLONASE) 50 MCG/ACT nasal spray Place 1 spray into both nostrils daily.  . lipase/protease/amylase (CREON) 36000 UNITS CPEP capsule Take 1 capsule (36,000 Units total) by mouth 3 (three) times daily before meals. Take with the first bite of food  . loratadine (CLARITIN) 10 MG tablet Take 1 tablet (10 mg total) by mouth daily.  . pantoprazole (PROTONIX) 40 MG tablet Take 1 tablet (40 mg total) by mouth daily.  . [DISCONTINUED] Evolocumab (REPATHA SURECLICK) 119 MG/ML SOAJ Inject 140 mg into the skin every 14 (fourteen) days.     Allergies  Allergen Reactions  . Flomax [Tamsulosin Hcl] Other (See Comments)    Hard time breathing  . Atorvastatin Other (See Comments)    REACTION: leg cramps  per patient with a large dose    Social History   Socioeconomic History  . Marital status: Divorced    Spouse name: Not on file  . Number of children: 1  . Years of education: Not on file  . Highest education level: Not on file  Occupational History  . Occupation: Animal nutritionist  Tobacco Use  . Smoking status: Never Smoker  . Smokeless tobacco: Never Used  Substance and Sexual Activity  . Alcohol use: No    Comment: rare  .  Drug use: No  . Sexual activity: Never    Birth control/protection: Abstinence  Other Topics Concern  . Not on file  Social History Narrative   Coffee daily    Social Determinants of Health   Financial Resource Strain:   . Difficulty of Paying Living Expenses: Not on file  Food Insecurity:   . Worried About Charity fundraiser in the Last Year: Not on file  . Ran Out of Food in the Last Year: Not on file  Transportation Needs:   . Lack of Transportation (Medical): Not on file  . Lack of Transportation (Non-Medical): Not on file  Physical Activity:   . Days of Exercise per Week: Not on file  . Minutes of Exercise per Session: Not on file  Stress:   . Feeling of Stress : Not on file  Social Connections:   . Frequency of Communication with Friends and Family: Not on file  . Frequency of Social Gatherings with Friends and Family: Not on file  . Attends Religious Services: Not on file  . Active Member of Clubs or Organizations: Not on file  . Attends Archivist Meetings: Not on file  . Marital Status: Not on file  Intimate Partner Violence:   . Fear of Current or Ex-Partner: Not on file  . Emotionally Abused: Not on file  . Physically Abused: Not on file  . Sexually Abused: Not on file     Review of Systems: General: negative for chills, fever, night sweats or weight changes.  Cardiovascular: negative for chest pain, dyspnea on exertion, edema, orthopnea, palpitations, paroxysmal nocturnal dyspnea or shortness of breath Dermatological: negative for rash Respiratory: negative for cough or wheezing Urologic: negative for hematuria Abdominal: negative for nausea, vomiting, diarrhea, bright red blood per rectum, melena, or hematemesis Neurologic: negative for visual changes, syncope, or dizziness All other systems reviewed and are otherwise negative except as noted above.    Blood pressure 92/70, pulse 77, height 5\' 9"  (1.753 m), weight 212 lb 6.4 oz (96.3 kg).    General appearance: alert and no distress Neck: no adenopathy, no carotid bruit, no JVD, supple, symmetrical, trachea midline and thyroid not enlarged, symmetric, no tenderness/mass/nodules Lungs: clear to auscultation bilaterally Heart: regular rate and rhythm, S1, S2 normal, no murmur, click, rub or gallop Extremities: extremities normal, atraumatic, no cyanosis or edema Pulses: 2+ and symmetric Skin: Skin color, texture, turgor normal. No rashes or lesions Neurologic: Alert and oriented X 3, normal strength and tone. Normal symmetric reflexes. Normal coordination and gait  EKG sinus rhythm at 77 with left axis deviation.  I personally reviewed this EKG.  ASSESSMENT AND PLAN:   Dyslipidemia History of hyperlipidemia intolerant to statin therapy on Repatha in the past although he no longer takes this because of financial constraints.  His lipid profile while on Repatha performed 07/27/2019 revealed total pressure 123, LDL 45 and HDL of 57.  Essential hypertension History of essential hypertension blood pressure measured  today at 92/70.  He is on amlodipine.  CAD S/P percutaneous coronary angioplasty History of CAD status post RCA intervention by myself 03/10/2016.  He did have a cath 02/05/2016 and had ventricular fibrillation after the third coronary injection on the right.  He continues to have occasional atypical chest pain which has not changed in frequency or severity.      Lorretta Harp MD FACP,FACC,FAHA, [facility] 02/12/2020 11:44 AM

## 2020-02-12 NOTE — Assessment & Plan Note (Signed)
History of hyperlipidemia intolerant to statin therapy on Repatha in the past although he no longer takes this because of financial constraints.  His lipid profile while on Repatha performed 07/27/2019 revealed total pressure 123, LDL 45 and HDL of 57.

## 2020-02-12 NOTE — Assessment & Plan Note (Signed)
History of essential hypertension blood pressure measured today at 92/70.  He is on amlodipine.

## 2020-02-13 LAB — LIPID PANEL
Chol/HDL Ratio: 4.6 ratio (ref 0.0–5.0)
Cholesterol, Total: 225 mg/dL — ABNORMAL HIGH (ref 100–199)
HDL: 49 mg/dL (ref >39)
LDL Chol Calc (NIH): 152 mg/dL — ABNORMAL HIGH (ref 0–99)
Triglycerides: 134 mg/dL (ref 0–149)
VLDL Cholesterol Cal: 24 mg/dL (ref 5–40)

## 2020-02-13 LAB — HEPATIC FUNCTION PANEL
ALT: 55 IU/L — ABNORMAL HIGH (ref 0–44)
AST: 38 IU/L (ref 0–40)
Albumin: 4.3 g/dL (ref 3.8–4.8)
Alkaline Phosphatase: 61 IU/L (ref 48–121)
Bilirubin Total: 0.6 mg/dL (ref 0.0–1.2)
Bilirubin, Direct: 0.15 mg/dL (ref 0.00–0.40)
Total Protein: 7 g/dL (ref 6.0–8.5)

## 2020-02-22 ENCOUNTER — Telehealth: Payer: Self-pay

## 2020-02-22 MED ORDER — REPATHA SURECLICK 140 MG/ML ~~LOC~~ SOAJ: 140.0000 mg | SUBCUTANEOUS | 11 refills | Status: DC

## 2020-02-22 NOTE — Telephone Encounter (Signed)
Used interpreter services to help me call the pt to notify them that the repatha was approved and that the rx was sent and instructed them to use the copay card, the pt voiced understanding and rx sent to pharmacy on file

## 2020-02-28 ENCOUNTER — Other Ambulatory Visit: Payer: Self-pay

## 2020-02-28 ENCOUNTER — Encounter: Payer: Self-pay | Admitting: Internal Medicine

## 2020-02-28 ENCOUNTER — Ambulatory Visit (INDEPENDENT_AMBULATORY_CARE_PROVIDER_SITE_OTHER): Payer: 59 | Admitting: Internal Medicine

## 2020-02-28 VITALS — BP 100/62 | HR 93 | Temp 97.8°F | Ht 69.0 in | Wt 207.0 lb

## 2020-02-28 DIAGNOSIS — R0609 Other forms of dyspnea: Secondary | ICD-10-CM

## 2020-02-28 DIAGNOSIS — N320 Bladder-neck obstruction: Secondary | ICD-10-CM

## 2020-02-28 DIAGNOSIS — Z9861 Coronary angioplasty status: Secondary | ICD-10-CM

## 2020-02-28 DIAGNOSIS — Z23 Encounter for immunization: Secondary | ICD-10-CM | POA: Diagnosis not present

## 2020-02-28 DIAGNOSIS — R635 Abnormal weight gain: Secondary | ICD-10-CM

## 2020-02-28 DIAGNOSIS — I480 Paroxysmal atrial fibrillation: Secondary | ICD-10-CM | POA: Diagnosis not present

## 2020-02-28 DIAGNOSIS — D126 Benign neoplasm of colon, unspecified: Secondary | ICD-10-CM

## 2020-02-28 DIAGNOSIS — Z0000 Encounter for general adult medical examination without abnormal findings: Secondary | ICD-10-CM

## 2020-02-28 DIAGNOSIS — I2510 Atherosclerotic heart disease of native coronary artery without angina pectoris: Secondary | ICD-10-CM

## 2020-02-28 DIAGNOSIS — R0600 Dyspnea, unspecified: Secondary | ICD-10-CM

## 2020-02-28 DIAGNOSIS — E538 Deficiency of other specified B group vitamins: Secondary | ICD-10-CM | POA: Diagnosis not present

## 2020-02-28 DIAGNOSIS — K635 Polyp of colon: Secondary | ICD-10-CM | POA: Insufficient documentation

## 2020-02-28 NOTE — Assessment & Plan Note (Signed)
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

## 2020-02-28 NOTE — Assessment & Plan Note (Addendum)
Risks associated with treatment noncompliance were discussed. Compliance was encouraged. B complex qd

## 2020-02-28 NOTE — Assessment & Plan Note (Signed)
On Repatha 

## 2020-02-28 NOTE — Assessment & Plan Note (Signed)
Deconditioned - start exercising

## 2020-02-28 NOTE — Assessment & Plan Note (Signed)
F/u w/Dr Gwenlyn Found

## 2020-02-28 NOTE — Addendum Note (Signed)
Addended by: Darlys Gales on: 02/28/2020 03:49 PM   Modules accepted: Orders

## 2020-02-28 NOTE — Assessment & Plan Note (Signed)
Finesteride

## 2020-02-28 NOTE — Assessment & Plan Note (Signed)
Wt Readings from Last 3 Encounters:  02/28/20 207 lb (93.9 kg)  02/12/20 212 lb 6.4 oz (96.3 kg)  02/06/20 214 lb (97.1 kg)

## 2020-02-28 NOTE — Progress Notes (Signed)
Subjective:  Patient ID: [name], male    DOB: 06-29-1955  Age: 64 y.o. MRN: [id number]  CC: No chief complaint on file.   HPI [name] presents for HTN, CAD, A fib  Outpatient Medications Prior to Visit  Medication Sig Dispense Refill  . amLODipine (NORVASC) 5 MG tablet Take 1 tablet (5 mg total) by mouth daily. 90 tablet 3  . aspirin EC 81 MG tablet Take 1 tablet (81 mg total) by mouth daily. 30 tablet 0  . b complex vitamins tablet Take 1 tablet by mouth daily. 100 tablet 3  . benzonatate (TESSALON) 200 MG capsule Take 1 capsule (200 mg total) by mouth 3 (three) times daily as needed for cough. 30 capsule 5  . Cholecalciferol (VITAMIN D3) 50 MCG (2000 UT) capsule Take 1 capsule (2,000 Units total) by mouth daily. 100 capsule 3  . doxazosin (CARDURA) 8 MG tablet Take 0.5 tablets (4 mg total) by mouth 2 (two) times daily. Needs office visit for further refills 30 tablet 11  . Evolocumab (REPATHA SURECLICK) 585 MG/ML SOAJ Inject 140 mg into the skin every 14 (fourteen) days. 2.1 mL 11  . finasteride (PROSCAR) 5 MG tablet Take 1 tablet (5 mg total) by mouth daily. 90 tablet 3  . lipase/protease/amylase (CREON) 36000 UNITS CPEP capsule Take 1 capsule (36,000 Units total) by mouth 3 (three) times daily before meals. Take with the first bite of food 180 capsule 3  . loratadine (CLARITIN) 10 MG tablet Take 1 tablet (10 mg total) by mouth daily. 30 tablet 11  . pantoprazole (PROTONIX) 40 MG tablet Take 1 tablet (40 mg total) by mouth daily. 90 tablet 3  . albuterol (PROAIR HFA) 108 (90 Base) MCG/ACT inhaler Inhale 2 puffs into the lungs every 6 (six) hours as needed for wheezing or shortness of breath. (Patient not taking: Reported on 02/28/2020) 18 g 5  . Cholecalciferol (VITAMIN D3) 1.25 MG (50000 UT) CAPS Take 1 capsule by mouth once a week. (Patient not taking: Reported on 02/28/2020) 6 capsule 0  . cyanocobalamin (COBAL-1000) 1000 MCG/ML injection Vit B12 1000 mcg qd x  4 days, then 1000 mcg weekly x4 then q 2 weeks sq. (Patient not taking: Reported on 02/28/2020) 10 mL 11  . fluticasone (FLONASE) 50 MCG/ACT nasal spray Place 1 spray into both nostrils daily. (Patient not taking: Reported on 02/28/2020) 16 g 2   No facility-administered medications prior to visit.    ROS: Review of Systems  Constitutional: Negative for appetite change, fatigue and unexpected weight change.  HENT: Negative for congestion, nosebleeds, sneezing, sore throat and trouble swallowing.   Eyes: Negative for itching and visual disturbance.  Respiratory: Negative for cough.   Cardiovascular: Positive for palpitations. Negative for chest pain and leg swelling.  Gastrointestinal: Negative for abdominal distention, blood in stool, diarrhea and nausea.  Genitourinary: Negative for frequency and hematuria.  Musculoskeletal: Negative for back pain, gait problem, joint swelling and neck pain.  Skin: Negative for rash.  Neurological: Negative for dizziness, tremors, speech difficulty and weakness.  Psychiatric/Behavioral: Negative for agitation, dysphoric mood and sleep disturbance. The patient is not nervous/anxious.     Objective:  BP 100/62 (BP Location: Right Arm, Patient Position: Sitting, Cuff Size: Large)   Pulse 93   Temp 97.8 F (36.6 C) (Oral)   Ht 5\' 9"  (1.753 m)   Wt 207 lb (93.9 kg)   SpO2 97%   BMI 30.57 kg/m   BP Readings from Last 3 Encounters:  02/28/20 100/62  02/12/20 92/70  02/06/20 118/66    Wt Readings from Last 3 Encounters:  02/28/20 207 lb (93.9 kg)  02/12/20 212 lb 6.4 oz (96.3 kg)  02/06/20 214 lb (97.1 kg)    Physical Exam Constitutional:      General: He is not in acute distress.    Appearance: He is well-developed.     Comments: NAD  Eyes:     Conjunctiva/sclera: Conjunctivae normal.     Pupils: Pupils are equal, round, and reactive to light.  Neck:     Thyroid: No thyromegaly.     Vascular: No JVD.  Cardiovascular:     Rate and Rhythm:  Normal rate and regular rhythm.     Heart sounds: Normal heart sounds. No murmur heard.  No friction rub. No gallop.   Pulmonary:     Effort: Pulmonary effort is normal. No respiratory distress.     Breath sounds: Normal breath sounds. No wheezing or rales.  Chest:     Chest wall: No tenderness.  Abdominal:     General: Bowel sounds are normal. There is no distension.     Palpations: Abdomen is soft. There is no mass.     Tenderness: There is no abdominal tenderness. There is no guarding or rebound.  Musculoskeletal:        General: No tenderness. Normal range of motion.     Cervical back: Normal range of motion.  Lymphadenopathy:     Cervical: No cervical adenopathy.  Skin:    General: Skin is warm and dry.     Findings: No rash.  Neurological:     Mental Status: He is alert and oriented to person, place, and time.     Cranial Nerves: No cranial nerve deficit.     Motor: No abnormal muscle tone.     Coordination: Coordination normal.     Gait: Gait normal.     Deep Tendon Reflexes: Reflexes are normal and symmetric.  Psychiatric:        Behavior: Behavior normal.        Thought Content: Thought content normal.        Judgment: Judgment normal.    Varic veins B - NT   Lab Results  Component Value Date   WBC 7.8 08/29/2019   HGB 14.5 08/29/2019   HCT 42.1 08/29/2019   PLT 217.0 08/29/2019   GLUCOSE 90 08/29/2019   CHOL 225 (H) 02/13/2020   TRIG 134 02/13/2020   HDL 49 02/13/2020   LDLDIRECT 110.0 04/15/2017   LDLCALC 152 (H) 02/13/2020   ALT 55 (H) 02/13/2020   AST 38 02/13/2020   NA 138 08/29/2019   K 4.1 08/29/2019   CL 103 08/29/2019   CREATININE 1.02 08/29/2019   BUN 13 08/29/2019   CO2 28 08/29/2019   TSH 1.81 08/29/2019   PSA 0.11 08/29/2019   INR 1.0 03/10/2016   HGBA1C 5.7 07/30/2015    DG Chest 2 View  Result Date: 08/02/2018 CLINICAL DATA:  Left-sided chest pain with cough for several days, initial encounter EXAM: CHEST - 2 VIEW COMPARISON:   05/28/2016 FINDINGS: Cardiac shadow is within normal limits. Mild scarring is noted in the left base stable the previous exam. No acute bony abnormality is noted. No focal infiltrate is seen. IMPRESSION: No acute abnormality noted. Electronically Signed   By: Inez Catalina M.D.   On: 08/02/2018 22:13   DG Ribs Unilateral Left  Result Date: 08/02/2018 CLINICAL DATA:  Left-sided chest pain, no known  injury, initial encounter EXAM: LEFT RIBS - 2 VIEW COMPARISON:  None. FINDINGS: No fracture or other bone lesions are seen involving the ribs. IMPRESSION: No acute abnormality noted. Electronically Signed   By: Inez Catalina M.D.   On: 08/02/2018 22:15    Assessment & Plan:    Walker Kehr, MD

## 2020-03-04 ENCOUNTER — Other Ambulatory Visit: Payer: Self-pay

## 2020-03-04 DIAGNOSIS — E785 Hyperlipidemia, unspecified: Secondary | ICD-10-CM

## 2020-03-04 NOTE — Progress Notes (Signed)
lipid

## 2020-04-02 ENCOUNTER — Encounter: Payer: Self-pay | Admitting: Internal Medicine

## 2020-04-30 ENCOUNTER — Telehealth: Payer: Self-pay | Admitting: Internal Medicine

## 2020-04-30 NOTE — Telephone Encounter (Signed)
   Patient's mother calling to get advice on pain and blood pressure concerns after getting Covid booster on Sunday

## 2020-04-30 NOTE — Telephone Encounter (Signed)
Use as needed Tylenol for pain.  Thanks

## 2020-05-01 NOTE — Telephone Encounter (Signed)
Notified ppt mom w/MD request../lmb

## 2020-05-09 ENCOUNTER — Other Ambulatory Visit: Payer: Self-pay | Admitting: Internal Medicine

## 2020-05-09 DIAGNOSIS — I4891 Unspecified atrial fibrillation: Secondary | ICD-10-CM

## 2020-06-16 ENCOUNTER — Other Ambulatory Visit: Payer: Self-pay | Admitting: Internal Medicine

## 2020-06-17 ENCOUNTER — Other Ambulatory Visit (INDEPENDENT_AMBULATORY_CARE_PROVIDER_SITE_OTHER): Payer: 59

## 2020-06-17 ENCOUNTER — Telehealth: Payer: Self-pay

## 2020-06-17 DIAGNOSIS — I480 Paroxysmal atrial fibrillation: Secondary | ICD-10-CM | POA: Diagnosis not present

## 2020-06-17 DIAGNOSIS — Z0000 Encounter for general adult medical examination without abnormal findings: Secondary | ICD-10-CM

## 2020-06-17 DIAGNOSIS — N320 Bladder-neck obstruction: Secondary | ICD-10-CM | POA: Diagnosis not present

## 2020-06-17 LAB — COMPREHENSIVE METABOLIC PANEL
ALT: 56 U/L — ABNORMAL HIGH (ref 0–53)
AST: 35 U/L (ref 0–37)
Albumin: 4.2 g/dL (ref 3.5–5.2)
Alkaline Phosphatase: 50 U/L (ref 39–117)
BUN: 14 mg/dL (ref 6–23)
CO2: 27 mEq/L (ref 19–32)
Calcium: 8.9 mg/dL (ref 8.4–10.5)
Chloride: 104 mEq/L (ref 96–112)
Creatinine, Ser: 1.04 mg/dL (ref 0.40–1.50)
GFR: 76.95 mL/min (ref >60.00)
Glucose, Bld: 137 mg/dL — ABNORMAL HIGH (ref 70–99)
Potassium: 3.9 mEq/L (ref 3.5–5.1)
Sodium: 138 mEq/L (ref 135–145)
Total Bilirubin: 0.7 mg/dL (ref 0.2–1.2)
Total Protein: 6.8 g/dL (ref 6.0–8.3)

## 2020-06-17 LAB — CBC WITH DIFFERENTIAL/PLATELET
Basophils Absolute: 0 10*3/uL (ref 0.0–0.1)
Basophils Relative: 0.8 % (ref 0.0–3.0)
Eosinophils Absolute: 0.2 10*3/uL (ref 0.0–0.7)
Eosinophils Relative: 2.5 % (ref 0.0–5.0)
HCT: 42.8 % (ref 39.0–52.0)
Hemoglobin: 15 g/dL (ref 13.0–17.0)
Lymphocytes Relative: 25.9 % (ref 12.0–46.0)
Lymphs Abs: 1.6 10*3/uL (ref 0.7–4.0)
MCHC: 35 g/dL (ref 30.0–36.0)
MCV: 88.3 fl (ref 78.0–100.0)
Monocytes Absolute: 0.6 10*3/uL (ref 0.1–1.0)
Monocytes Relative: 10.1 % (ref 3.0–12.0)
Neutro Abs: 3.7 10*3/uL (ref 1.4–7.7)
Neutrophils Relative %: 60.7 % (ref 43.0–77.0)
Platelets: 206 10*3/uL (ref 150.0–400.0)
RBC: 4.85 Mil/uL (ref 4.22–5.81)
RDW: 13.1 % (ref 11.5–15.5)
WBC: 6.1 10*3/uL (ref 4.0–10.5)

## 2020-06-17 LAB — URINALYSIS
Bilirubin Urine: NEGATIVE
Hgb urine dipstick: NEGATIVE
Ketones, ur: NEGATIVE
Leukocytes,Ua: NEGATIVE
Nitrite: NEGATIVE
Specific Gravity, Urine: 1.02 (ref 1.000–1.030)
Total Protein, Urine: NEGATIVE
Urine Glucose: NEGATIVE
Urobilinogen, UA: 0.2 (ref 0.0–1.0)
pH: 6.5 (ref 5.0–8.0)

## 2020-06-17 LAB — TSH: TSH: 1.98 u[IU]/mL (ref 0.35–4.50)

## 2020-06-17 LAB — LIPID PANEL
Cholesterol: 127 mg/dL (ref 0–200)
HDL: 56.6 mg/dL (ref >39.00)
LDL Cholesterol: 46 mg/dL (ref 0–99)
NonHDL: 70.63
Total CHOL/HDL Ratio: 2
Triglycerides: 122 mg/dL (ref 0.0–149.0)
VLDL: 24.4 mg/dL (ref 0.0–40.0)

## 2020-06-17 LAB — PSA: PSA: 0.07 ng/mL — ABNORMAL LOW (ref 0.10–4.00)

## 2020-06-17 NOTE — Addendum Note (Signed)
Addended by: Miguel Aschoff on: 06/17/2020 08:50 AM   Modules accepted: Orders

## 2020-06-17 NOTE — Telephone Encounter (Signed)
Pt has hx of V Fib arrest during heart cath in September of 2017.  Has previsit sched 1/12 for colon with Dr Russella Dar on 07/16/20.  Just wanted verification if he is still ok for [facility],  thanks, [employer]

## 2020-06-18 NOTE — Telephone Encounter (Signed)
[name],  This pt is cleared for anesthetic care at [facility].  Thanks,  Emmanual Gauthreaux 

## 2020-07-02 ENCOUNTER — Other Ambulatory Visit: Payer: Self-pay

## 2020-07-02 ENCOUNTER — Ambulatory Visit (AMBULATORY_SURGERY_CENTER): Payer: Self-pay | Admitting: *Deleted

## 2020-07-02 VITALS — Ht 69.0 in | Wt 211.8 lb

## 2020-07-02 DIAGNOSIS — Z86010 Personal history of colonic polyps: Secondary | ICD-10-CM

## 2020-07-02 MED ORDER — SUPREP BOWEL PREP KIT 17.5-3.13-1.6 GM/177ML PO SOLN: 1.0000 | Freq: Once | ORAL | 0 refills | Status: AC

## 2020-07-02 NOTE — Progress Notes (Signed)
Completed covid x3  Interpreter used today at the [facility] for this pt.  Interpreter's name is-Alfia.  The pt and interpreter know each other well.  She states that he cannot get a care partner, she will be his care partner  Pt is aware that care partner will wait in the car during procedure; if they feel like they will be too hot or cold to wait in the car; they may wait in the 4 th floor lobby. Patient is aware to bring only one care partner. We want them to wear a mask (we do not have any that we can provide them), practice social distancing, and we will check their temperatures when they get here.  I did remind the patient that their care partner needs to stay in the parking lot the entire time and have a cell phone available, we will call them when the pt is ready for discharge. Patient will wear mask into building.   No trouble with anesthesia, denies being told they were difficult to intubate, or hx/fam hx of malignant hyperthermia per pt   No egg or soy allergy  No home oxygen use   No medications for weight loss taken  emmi information given  Pt denies constipation issues

## 2020-07-16 ENCOUNTER — Ambulatory Visit (AMBULATORY_SURGERY_CENTER): Payer: 59 | Admitting: Gastroenterology

## 2020-07-16 ENCOUNTER — Encounter: Payer: Self-pay | Admitting: Gastroenterology

## 2020-07-16 ENCOUNTER — Other Ambulatory Visit: Payer: Self-pay

## 2020-07-16 VITALS — BP 108/62 | HR 63 | Temp 98.1°F | Resp 14 | Ht 69.0 in | Wt 211.0 lb

## 2020-07-16 DIAGNOSIS — K635 Polyp of colon: Secondary | ICD-10-CM | POA: Diagnosis not present

## 2020-07-16 DIAGNOSIS — Z86010 Personal history of colonic polyps: Secondary | ICD-10-CM | POA: Diagnosis not present

## 2020-07-16 DIAGNOSIS — D125 Benign neoplasm of sigmoid colon: Secondary | ICD-10-CM

## 2020-07-16 MED ORDER — SODIUM CHLORIDE 0.9 % IV SOLN: 500.0000 mL | Freq: Once | INTRAVENOUS | Status: DC

## 2020-07-16 NOTE — Progress Notes (Signed)
Patient had a run of an extra heart beat in recovery.  Patient denies chest pain or shortness of breath.  States has seen a cardiologist in the past.  States that he hasn't seen him in "awhile."  I suggested that he see his cardiologist just in case since he hasn't seen him in awhile.  Dr Fuller Plan is aware of the heart beats.  Patient was given a strip regarding his heart rate.  Again, patient is asymptomatic at this time.

## 2020-07-16 NOTE — Progress Notes (Signed)
Vital signs checked by:CW  The patient states no changes in medical or surgical history since pre-visit screening on 07/02/2020  Interpreter used today at the [facility] for this pt.  Interpreter's name Dillard Essex.

## 2020-07-16 NOTE — Progress Notes (Signed)
Report to PACU, RN, vss, BBS= Clear.  

## 2020-07-16 NOTE — Progress Notes (Signed)
Called to room to assist during endoscopic procedure.  Patient ID and intended procedure confirmed with present staff. Received instructions for my participation in the procedure from the performing physician.  

## 2020-07-16 NOTE — Patient Instructions (Signed)
Read all of the handouts given to you by your recovery room nurse. Your interpreter will translate the discharge instructions to your friend.   Your heart-rate is regular with times of an extra beat.  Please, see your cardiologist even through you have no symptoms at this time.    YOU HAD AN ENDOSCOPIC PROCEDURE TODAY AT [facility]:   Refer to the procedure report that was given to you for any specific questions about what was found during the examination.  If the procedure report does not answer your questions, please call your gastroenterologist to clarify.  If you requested that your care partner not be given the details of your procedure findings, then the procedure report has been included in a sealed envelope for you to review at your convenience later.  YOU SHOULD EXPECT: Some feelings of bloating in the abdomen. Passage of more gas than usual.  Walking can help get rid of the air that was put into your GI tract during the procedure and reduce the bloating. If you had a lower endoscopy (such as a colonoscopy or flexible sigmoidoscopy) you may notice spotting of blood in your stool or on the toilet paper. If you underwent a bowel prep for your procedure, you may not have a normal bowel movement for a few days.  Please Note:  You might notice some irritation and congestion in your nose or some drainage.  This is from the oxygen used during your procedure.  There is no need for concern and it should clear up in a day or so.  SYMPTOMS TO REPORT IMMEDIATELY:   Following lower endoscopy (colonoscopy or flexible sigmoidoscopy):  Excessive amounts of blood in the stool  Significant tenderness or worsening of abdominal pains  Swelling of the abdomen that is new, acute  Fever of 100F or higher   For urgent or emergent issues, a gastroenterologist can be reached at any hour by calling [phone]. Do not use MyChart messaging for urgent concerns.    DIET:  We do recommend a  small meal at first, but then you may proceed to your regular diet.  Drink plenty of fluids but you should avoid alcoholic beverages for 24 hours. Try to eat a high fiber diet, and drink plenty of water.  ACTIVITY:  You should plan to take it easy for the rest of today and you should NOT DRIVE or use heavy machinery until tomorrow (because of the sedation medicines used during the test).    FOLLOW UP: Our staff will call the number listed on your records 48-72 hours following your procedure to check on you and address any questions or concerns that you may have regarding the information given to you following your procedure. If we do not reach you, we will leave a message.  We will attempt to reach you two times.  During this call, we will ask if you have developed any symptoms of COVID 19. If you develop any symptoms (ie: fever, flu-like symptoms, shortness of breath, cough etc.) before then, please call [phone].  If you test positive for Covid 19 in the 2 weeks post procedure, please call and report this information to [place].    If any biopsies were taken you will be contacted by phone or by letter within the next 1-3 weeks.  Please call us at [phone] if you have not heard about the biopsies in 3 weeks.    SIGNATURES/CONFIDENTIALITY: You and/or your care partner have signed paperwork which will  be entered into your electronic medical record.  These signatures attest to the fact that that the information above on your After Visit Summary has been reviewed and is understood.  Full responsibility of the confidentiality of this discharge information lies with you and/or your care-partner. 

## 2020-07-16 NOTE — Op Note (Signed)
[place] Patient Name: [name] Procedure Date: 07/16/2020 1:17 PM MRN: [id number] Endoscopist: Ladene Artist , MD Age: 65 Referring MD:  Date of Birth: September 02, 1955 Gender: Male Account #: [id number] Procedure:                Colonoscopy Indications:              Surveillance: Personal history of adenomatous                            polyps on last colonoscopy > 5 years ago Medicines:                Monitored Anesthesia Care Procedure:                Pre-Anesthesia Assessment:                           - Prior to the procedure, a History and Physical                            was performed, and patient medications and                            allergies were reviewed. The patient's tolerance of                            previous anesthesia was also reviewed. The risks                            and benefits of the procedure and the sedation                            options and risks were discussed with the patient.                            All questions were answered, and informed consent                            was obtained. Prior Anticoagulants: The patient has                            taken no previous anticoagulant or antiplatelet                            agents. ASA Grade Assessment: II - A patient with                            mild systemic disease. After reviewing the risks                            and benefits, the patient was deemed in                            satisfactory condition to undergo the procedure.  After obtaining informed consent, the colonoscope                            was passed under direct vision. Throughout the                            procedure, the patient's blood pressure, pulse, and                            oxygen saturations were monitored continuously. The                            Olympus CF-HQ190L [id number]) Colonoscope was                            introduced through the  anus and advanced to the the                            cecum, identified by appendiceal orifice and                            ileocecal valve. The ileocecal valve, appendiceal                            orifice, and rectum were photographed. The quality                            of the bowel preparation was good. The colonoscopy                            was performed without difficulty. The patient                            tolerated the procedure well. Scope In: 1:47:18 PM Scope Out: 2:01:41 PM Scope Withdrawal Time: 0 hours 11 minutes 42 seconds  Total Procedure Duration: 0 hours 14 minutes 23 seconds  Findings:                 The perianal and digital rectal examinations were                            normal.                           A 7 mm polyp was found in the sigmoid colon. The                            polyp was sessile. The polyp was removed with a                            cold snare. Resection and retrieval were complete.                           Multiple medium-mouthed diverticula were found in  the left colon. There was no evidence of                            diverticular bleeding.                           Internal hemorrhoids were found during                            retroflexion. The hemorrhoids were small and Grade                            I (internal hemorrhoids that do not prolapse).                           The exam was otherwise without abnormality on                            direct and retroflexion views. Complications:            No immediate complications. Estimated blood loss:                            None. Estimated Blood Loss:     Estimated blood loss: none. Impression:               - One 7 mm polyp in the sigmoid colon, removed with                            a cold snare. Resected and retrieved.                           - Moderate diverticulosis in the left colon.                           - Internal  hemorrhoids.                           - The examination was otherwise normal on direct                            and retroflexion views. Recommendation:           - Repeat colonoscopy after studies are complete for                            surveillance based on pathology results.                           - Patient has a contact number available for                            emergencies. The signs and symptoms of potential                            delayed complications were discussed with the  patient. Return to normal activities tomorrow.                            Written discharge instructions were provided to the                            patient.                           - High fiber diet.                           - Continue present medications.                           - Await pathology results. Ladene Artist, MD 07/16/2020 2:04:31 PM This report has been signed electronically.

## 2020-07-18 ENCOUNTER — Telehealth: Payer: Self-pay

## 2020-07-18 NOTE — Telephone Encounter (Signed)
  Follow up Call-  Call back number 07/16/2020  Post procedure Call Back phone  # [phone]  Permission to leave phone message Yes  Some recent data might be hidden     Patient questions:  Do you have a fever, pain , or abdominal swelling? No. Pain Score  0 *  Have you tolerated food without any problems? Yes.    Have you been able to return to your normal activities? Yes.    Do you have any questions about your discharge instructions: Diet   No. Medications  No. Follow up visit  No.  Do you have questions or concerns about your Care? No.  Actions: * If pain score is 4 or above: No action needed, pain <4.  1. Have you developed a fever since your procedure? no  2.   Have you had an respiratory symptoms (SOB or cough) since your procedure? no  3.   Have you tested positive for COVID 19 since your procedure no  4.   Have you had any family members/close contacts diagnosed with the COVID 19 since your procedure?  no   If yes to any of these questions please route to Joylene John, RN and Joella Prince, RN

## 2020-07-23 ENCOUNTER — Other Ambulatory Visit: Payer: Self-pay | Admitting: Internal Medicine

## 2020-07-29 ENCOUNTER — Encounter: Payer: Self-pay | Admitting: Gastroenterology

## 2020-07-31 ENCOUNTER — Other Ambulatory Visit: Payer: Self-pay | Admitting: Internal Medicine

## 2020-07-31 DIAGNOSIS — I4891 Unspecified atrial fibrillation: Secondary | ICD-10-CM

## 2020-08-04 ENCOUNTER — Telehealth: Payer: 59 | Admitting: Cardiology

## 2020-08-06 NOTE — Progress Notes (Deleted)
Cardiology Office Note:    Date:  08/06/2020   ID:  [name], DOB Nov 24, 1955, MRN [id number]  PCP:  Cassandria Anger, MD  Cardiologist:  No primary care provider on file.   Referring MD: Cassandria Anger, MD   No chief complaint on file. ***  History of Present Illness:    [name] is a 65 y.o. male with a hx of CAD, HTN, PAF, VFib, GERD, and a family history of CAD,  He was seen by Dr. Lovena Le 01/2015 for new onset Afib, referred by his PCP. He has a low CHADSVASC of 1 and was placed on ASA (HTN). He was having infrequent episodes of Afib and was given flecainide for pill-in-the-pocket.   Nuclear stress test 06/2015 with low risk.  CT chest for persistent cough showed small pericardial effusion and calcium in the LAD. He had OP left heart cath 01/2016 that showed mid dominant RCA stenosis. He developed Vfib during the procedure and required CPR and defibrillation. He had ongoing chest pain and eventually underwent stenting of his RCA via femoral approach 02/2016.   He continued to have atypical chest pain and PCP discontinued statin for suspected statin intolerance. He started repatha. He was last seen by Dr. Gwenlyn Found 02/12/20.   Pt sent a MyChart message stating he had tachycardia on two occasions and wished to restart his flecainide. He reported tachycardia with HR in the 80s. He was not symptomatic with these episodes, no dizziness or syncope.    Not finished   Past Medical History:  Diagnosis Date  . Allergy   . Atrial fibrillation ([place])   . CAD (coronary artery disease)    a.cath 01/2016: 60% stenosis RCA. Went into VF during procedure requiring CPR and defibrillation. No intervention performed at that time b.cath 02/2016: 3 mm x 15 mm Xience DES to prox-RCA   . Coronary artery disease   . GERD (gastroesophageal reflux disease)   . Headache(784.0)   . Hyperlipidemia   . Hypertension   . Kidney stones    "passed it" (03/18/2016)  . URI (upper  respiratory infection)     Past Surgical History:  Procedure Laterality Date  . CARDIAC CATHETERIZATION N/A 02/05/2016   Procedure: Left Heart Cath and Coronary Angiography;  Surgeon: Lorretta Harp, MD;  Location: [facility];  Service: Cardiovascular;  Laterality: N/A;  . CARDIAC CATHETERIZATION N/A 03/18/2016   Procedure: Coronary Stent Intervention;  Surgeon: Lorretta Harp, MD;  Location: [facility];  Service: Cardiovascular;  Laterality: N/A;  RCA  . COLONOSCOPY    . CORONARY ANGIOPLASTY    . INGUINAL HERNIA REPAIR Bilateral    2005  . TONSILLECTOMY      Current Medications: No outpatient medications have been marked as taking for the 08/11/20 encounter (Appointment) with Ledora Bottcher, [place].     Allergies:   Flomax [tamsulosin hcl] and Atorvastatin   Social History   Socioeconomic History  . Marital status: Divorced    Spouse name: Not on file  . Number of children: 1  . Years of education: Not on file  . Highest education level: Not on file  Occupational History  . Occupation: Animal nutritionist  Tobacco Use  . Smoking status: Never Smoker  . Smokeless tobacco: Never Used  Vaping Use  . Vaping Use: Never used  Substance and Sexual Activity  . Alcohol use: Not Currently  . Drug use: No  . Sexual activity: Never    Birth control/protection:  Abstinence  Other Topics Concern  . Not on file  Social History Narrative   Coffee daily    Social Determinants of Health   Financial Resource Strain: Not on file  Food Insecurity: Not on file  Transportation Needs: Not on file  Physical Activity: Not on file  Stress: Not on file  Social Connections: Not on file     Family History: The patient's ***family history includes Coronary artery disease in his father and mother; Diabetes in his father; Heart attack in his maternal grandfather and mother; Heart disease in his father and mother; Hypertension in his mother; Kidney disease in his mother; Liver  disease in his mother. There is no history of Colon cancer, Stroke, Esophageal cancer, Stomach cancer, or Rectal cancer.  ROS:   Please see the history of present illness.    *** All other systems reviewed and are negative.  EKGs/Labs/Other Studies Reviewed:    The following studies were reviewed today: ***  EKG:  EKG is *** ordered today.  The ekg ordered today demonstrates ***  Recent Labs: 06/17/2020: ALT 56; BUN 14; Creatinine, Ser 1.04; Hemoglobin 15.0; Platelets 206.0; Potassium 3.9; Sodium 138; TSH 1.98  Recent Lipid Panel    Component Value Date/Time   CHOL 127 06/17/2020 0850   CHOL 225 (H) 02/13/2020 0813   TRIG 122.0 06/17/2020 0850   HDL 56.60 06/17/2020 0850   HDL 49 02/13/2020 0813   CHOLHDL 2 06/17/2020 0850   VLDL 24.4 06/17/2020 0850   LDLCALC 46 06/17/2020 0850   LDLCALC 152 (H) 02/13/2020 0813   LDLDIRECT 110.0 04/15/2017 1359    Physical Exam:    VS:  There were no vitals taken for this visit.    Wt Readings from Last 3 Encounters:  07/16/20 211 lb (95.7 kg)  07/02/20 211 lb 12.8 oz (96.1 kg)  02/28/20 207 lb (93.9 kg)     GEN: *** Well nourished, well developed in no acute distress HEENT: Normal NECK: No JVD; No carotid bruits LYMPHATICS: No lymphadenopathy CARDIAC: ***RRR, no murmurs, rubs, gallops RESPIRATORY:  Clear to auscultation without rales, wheezing or rhonchi  ABDOMEN: Soft, non-tender, non-distended MUSCULOSKELETAL:  No edema; No deformity  SKIN: Warm and dry NEUROLOGIC:  Alert and oriented x 3 PSYCHIATRIC:  Normal affect   ASSESSMENT:    No diagnosis found. PLAN:    In order of problems listed above:  No diagnosis found.   Medication Adjustments/Labs and Tests Ordered: Current medicines are reviewed at length with the patient today.  Concerns regarding medicines are outlined above.  No orders of the defined types were placed in this encounter.  No orders of the defined types were placed in this  encounter.   Signed, Ledora Bottcher, [place]  08/06/2020 8:54 AM    [facility]

## 2020-08-09 NOTE — Progress Notes (Signed)
Cardiology Office Note:    Date:  08/11/2020   ID:  [name], DOB 06/07/1956, MRN [id number]  PCP:  Cassandria Anger, MD  Cardiologist:  Quay Burow, MD  Electrophysiologist:  None   Referring MD: Cassandria Anger, MD   Chief Complaint: tachycardia/palpitaitons  History of Present Illness:    [name] is a 65 y.o. male with a history of CAD s/p DES to proximal RCA in 01/2016, paroxysmal atrial fibrillation only on Aspirin, hypertension, hyperlipidemia on Repatha, and GERD who is followed by Dr Gwenlyn Found and presents today for evaluation of tachycardia.  Patient was admitted in 12/2014 for palpitations. Initial EKG showed a wide complex tachycardia with heart rates in the 150's. He spontaneously converted to sinus rhythm on his own without any medications. EKG was reviewed by Dr. Lovena Le and felt to be atrial fibrillation with aberrancy. He was not started on any AV nodal agents due to baseline bradycardia. He was started on Aspirin alone due to low CHA2DS2-VASc score of 1 (HTN). He was seen by Dr. Lovena Le for follow-up and was prescribed pill in pocket Flecainide for palpitations lasting longer than 5 minutes.   At visit in early 2017, he reported chest pain. Myoview was ordered and was low risk with evidence of ischemia. He continued to report chest pain at visit in 01/2016; therefore decision was to proceed with cardiac catheterization. Patient presented for outpatient LHC on 02/05/2016 and was found to have 60% stenosis of proximal to mid dominant RCA. During procedure, he went into ventricular fibrillation after the 3rd coronary injection requiring CPR and defibrillation. The decision was made to admit him overnight for observation and then bring him back in a couple of weeks for PCI of the RCA. He came back to the cath lab on 03/18/2016 and underwent successful PCI with DES to RCA lesion. Most recent Echo in 03/2016 showed LVEF of 60-65% with normal wall motion and  normal diastolic function. Patient has continued to have atypical chest pain since cath. He was last seen by Dr. Gwenlyn Found in 01/2020 at which time he continued to note occasional atypical chest pain but was overall stable.  Patient sent our office a MyChart message on 07/31/2020 with reports of a couple of episodes of tachycardia. He wanted to know if he could restarted Flecainide which he had been on in the past. Therefore, this evaluation was scheduled for further evaluation.   Patient reports he had a routine colonoscopy 3-4 weeks ago. During this procedure, he had an episode of severe palpitations. He had another similar episode of palpitations since then as well as 2 milder episode of palpitations. He states that difficult to take a deep breath when he has these palpitations. No lightheadedness, dizziness, or syncope. Patient also describes substernal chest tightness when he walks/moves at a faster pace since his colonoscopy. It does not occur every time but it has been occurring pretty consistently and multiple times a weeks (almost daily). He ranks the tightness as a 7/10 on the pain scale. He states it improves with rest. No chest pain at rest.  No real shortness of breath with this. No orthopnea, PND, or edema. No abnormal bleeding in urine or stools.  Past Medical History:  Diagnosis Date  . Allergy   . Atrial fibrillation ([place])   . CAD (coronary artery disease)    a.cath 01/2016: 60% stenosis RCA. Went into VF during procedure requiring CPR and defibrillation. No intervention performed at that time b.cath 02/2016: 3 mm  x 15 mm Xience DES to prox-RCA   . Coronary artery disease   . GERD (gastroesophageal reflux disease)   . Headache(784.0)   . Hyperlipidemia   . Hypertension   . Kidney stones    "passed it" (03/18/2016)  . URI (upper respiratory infection)     Past Surgical History:  Procedure Laterality Date  . CARDIAC CATHETERIZATION N/A 02/05/2016   Procedure: Left Heart Cath and  Coronary Angiography;  Surgeon: Lorretta Harp, MD;  Location: [facility];  Service: Cardiovascular;  Laterality: N/A;  . CARDIAC CATHETERIZATION N/A 03/18/2016   Procedure: Coronary Stent Intervention;  Surgeon: Lorretta Harp, MD;  Location: [facility] [place];  Service: Cardiovascular;  Laterality: N/A;  RCA  . COLONOSCOPY    . CORONARY ANGIOPLASTY    . INGUINAL HERNIA REPAIR Bilateral    2005  . TONSILLECTOMY      Current Medications: Current Meds  Medication Sig  . albuterol (PROAIR HFA) 108 (90 Base) MCG/ACT inhaler Inhale 2 puffs into the lungs every 6 (six) hours as needed for wheezing or shortness of breath.  Marland Kitchen amLODipine (NORVASC) 5 MG tablet Take 1 tablet (5 mg total) by mouth daily.  Marland Kitchen aspirin EC 81 MG tablet Take 1 tablet (81 mg total) by mouth daily.  Marland Kitchen b complex vitamins tablet Take 1 tablet by mouth daily.  . benzonatate (TESSALON) 200 MG capsule Take 1 capsule (200 mg total) by mouth 3 (three) times daily as needed for cough.  . Cholecalciferol (VITAMIN D3) 50 MCG (2000 UT) capsule Take 1 capsule (2,000 Units total) by mouth daily.  Marland Kitchen doxazosin (CARDURA) 8 MG tablet Take 0.5 tablets (4 mg total) by mouth 2 (two) times daily. Annual appt due in Feb must see provider for future refills  . Evolocumab (REPATHA SURECLICK) 564 MG/ML SOAJ Inject 140 mg into the skin every 14 (fourteen) days.  . finasteride (PROSCAR) 5 MG tablet Take 1 tablet (5 mg total) by mouth daily.  . metoprolol succinate (TOPROL XL) 25 MG 24 hr tablet Take 1/2 tablet ( 12.5 mg ) daily     Allergies:   Flomax [tamsulosin hcl] and Atorvastatin   Social History   Socioeconomic History  . Marital status: Divorced    Spouse name: Not on file  . Number of children: 1  . Years of education: Not on file  . Highest education level: Not on file  Occupational History  . Occupation: Animal nutritionist  Tobacco Use  . Smoking status: Never Smoker  . Smokeless tobacco: Never Used  Vaping Use  .  Vaping Use: Never used  Substance and Sexual Activity  . Alcohol use: Not Currently  . Drug use: No  . Sexual activity: Never    Birth control/protection: Abstinence  Other Topics Concern  . Not on file  Social History Narrative   Coffee daily    Social Determinants of Health   Financial Resource Strain: Not on file  Food Insecurity: Not on file  Transportation Needs: Not on file  Physical Activity: Not on file  Stress: Not on file  Social Connections: Not on file     Family History: The patient's family history includes Coronary artery disease in his father and mother; Diabetes in his father; Heart attack in his maternal grandfather and mother; Heart disease in his father and mother; Hypertension in his mother; Kidney disease in his mother; Liver disease in his mother. There is no history of Colon cancer, Stroke, Esophageal cancer, Stomach cancer, or  Rectal cancer.  ROS:   Please see the history of present illness.     EKGs/Labs/Other Studies Reviewed:    The following studies were reviewed today:  Left Cardiac Catheterization 01/30/2016:  Mid RCA lesion, 60 %stenosed.  Impression: Mr. Mahmood has mild narrowing of the distal left main disease does not appear to be significant and has a 60% hypodense proximal to mid dominant RCA stenosis. He did fibrillate after the third coronary injection requiring CPR and defibrillation. I was not damped nor was I engaged in the conus branch. Based on this, his symptoms I feel the RCA should be percutaneously addressed. I will keep him in to a stepdown overnight and discharged home in the morning. We will arrange for him to have elective RCA PCI and stenting in the next one to 2 weeks. He left the lab awake, alert and hemodynamic stable. _______________  Coronary Stent Intervention 03/18/2016:  Prox RCA lesion, 60 %stenosed.  Post intervention, there is a 0% residual stenosis.  A stent was successfully placed.  Impression:  Successful RCA PCI and drug eluting stenting using a Xience drug-eluting stent in the proximal to mid dominant RCA. The patient is already on dual antiplatelet therapy. The sheath will be removed in several hours and pressure held. The patient will stay overnight and be hydrated. He'll be discharged home in the morning. I will see him back in the office in 2-3 weeks for follow-up. _______________  Echocardiogram 04/20/2016: Study Conclusions: - Left ventricle: The cavity size was normal. Systolic function was  normal. The estimated ejection fraction was in the range of 60%  to 65%. Wall motion was normal; there were no regional wall  motion abnormalities. Left ventricular diastolic function  parameters were normal.  - Aortic valve: Trileaflet; normal thickness, mildly calcified  leaflets.  - Pulmonic valve: There was trivial regurgitation.  - Pulmonary arteries: Systolic pressure could not be accurately  estimated.   EKG:  EKG ordered today. EKG personally reviewed and demonstrates normal sinus rhythm, rate 73 bpm, with non-specific ST/T changes. Left axis deviation. Normal PR and QRS intervals. QTc 418 ms.  Recent Labs: 06/17/2020: ALT 56; BUN 14; Creatinine, Ser 1.04; Hemoglobin 15.0; Platelets 206.0; Potassium 3.9; Sodium 138; TSH 1.98  Recent Lipid Panel    Component Value Date/Time   CHOL 127 06/17/2020 0850   CHOL 225 (H) 02/13/2020 0813   TRIG 122.0 06/17/2020 0850   HDL 56.60 06/17/2020 0850   HDL 49 02/13/2020 0813   CHOLHDL 2 06/17/2020 0850   VLDL 24.4 06/17/2020 0850   LDLCALC 46 06/17/2020 0850   LDLCALC 152 (H) 02/13/2020 0813   LDLDIRECT 110.0 04/15/2017 1359    Physical Exam:    Vital Signs: BP 112/62 (BP Location: Left Arm, Patient Position: Sitting)   Pulse 73   Ht 5\' 9"  (1.753 m)   Wt 209 lb 6.4 oz (95 kg)   SpO2 94%   BMI 30.92 kg/m     Wt Readings from Last 3 Encounters:  08/11/20 209 lb 6.4 oz (95 kg)  07/16/20 211 lb (95.7 kg)   07/02/20 211 lb 12.8 oz (96.1 kg)     General: 65 y.o. male in no acute distress. HEENT: Normocephalic and atraumatic. Sclera clear. EOMs intact. Neck: Supple. No JVD. Heart: RRR. Distinct S1 and S2. No murmurs, gallops, or rubs. Radial pulses 2+ and equal bilaterally. Lungs: No increased work of breathing. Clear to ausculation bilaterally. No wheezes, rhonchi, or rales.  Abdomen: Soft, non-distended, and non-tender to palpation.  Bowel sounds present in all 4 quadrants.  Extremities: Trace lower extremity edema bilaterally. Skin: Warm and dry. Neuro: Alert and oriented x3. No focal deficits. Psych: Normal affect. Responds appropriately.  Assessment:    1. Chest pain of uncertain etiology   2. PAF (paroxysmal atrial fibrillation) ([place])   3. Dyslipidemia   4. Palpitations     Plan:    Palpitations History of Paroxysmal Atrial Fibrillation - Patient reports a few episodes of strong/severe palpitations over the last couple of weeks. He does have a history of paroxysmal atrial fibrillation in 2016. No documented recurrence since then.  - EKG today shows normal sinus rhythm with no ectopy.  - Previously on pill in pocket Flecainide (but this was prescribed before diagnosis of CAD). He was requesting this be prescribed again but I explained that this is not recommended in patient with structural heart disease. - Will start Toprol-XL 12.5mg  daily. He does have a history of bradycardia so have asked him to monitor his BP/HR with this. - Will check BMET, Mg, CBC, and TSH today. - Will order 30-Day Event Monitor. - I wanted to order an Echo as well but patient felt like we were already ordering too much. I felt like stress test was more important than Echo at this point. We can assess EF on Myoview.  - CHA2DS2-VASc = 2 (HTN, CAD). Only on Aspirin 81mg  daily. If monitor shows any recurrent atrial fibrillation, will need to discuss starting anticoagulation.   Chest Tightness History of CAD   - History of CAD s/p DES to RCA in 02/2016.  - Patient reports 7/10 chest tightness when he walks at a face past. Resolves with rest. It does sound like he had chest tightness prior to his PCI in 2017 but symptoms were more severe at that time. - EKG showed no acute ischemic changes. - Added low dose beta-blocker given palpitations. - Continue Aspirin and Repatha. - Discussed Myoview vs possible cardiac catheterization. Patient preferred non-invasive testing first. Therefore, will order Exercise Myoview (OK to switch to [place] if needed).   Shared Decision Making/Informed Consent{ The risks [chest pain, shortness of breath, cardiac arrhythmias, dizziness, blood pressure fluctuations, myocardial infarction, stroke/transient ischemic attack, nausea, vomiting, allergic reaction, radiation exposure, metallic taste sensation and life-threatening complications (estimated to be 1 in 10,000)], benefits (risk stratification, diagnosing coronary artery disease, treatment guidance) and alternatives of a nuclear stress test were discussed in detail with Mr. Weida and he agrees to proceed.  Hypertension - BP well controlled. - Added low dose beta-blocker as above. - Continue Amlodipine 5mg  daily. Also on Cardura 4mg  daily. - Advised patient to closely monitor BP/HR with addition of beta-blocker. If BP drops too low, will probably recommend we stop Cardura first.  Hyperlipidemia - Most recent lipid panel in 05/2020: Total Cholesterol 127, Triglycerides 122, HDL 56, LDL 46.  - LDL goal <70 given CAD. - Continue Repatha. - Labs followed by PCP. - Did not have time to discuss today given acute issues above.  **Of note, in-personal interpreter Rosalyn Charters assisted with the entire visit.**  Disposition: Follow up with Dr. Gwenlyn Found in 1 month after above tests. If Myoview is abnormal, will need to be seen sooner.   Medication Adjustments/Labs and Tests Ordered: Current medicines are reviewed at  length with the patient today.  Concerns regarding medicines are outlined above.  Orders Placed This Encounter  Procedures  . Basic metabolic panel  . Magnesium  . TSH  . CBC w/Diff/Platelet  . Cardiac Stress  Test: Informed Consent Details: Physician/Practitioner Attestation; Transcribe to consent form and obtain patient signature  . Myocardial Perfusion Imaging  . Cardiac event monitor  . EKG 12-Lead   Meds ordered this encounter  Medications  . metoprolol succinate (TOPROL XL) 25 MG 24 hr tablet    Sig: Take 1/2 tablet ( 12.5 mg ) daily    Dispense:  45 tablet    Refill:  3    Signed, Eppie Gibson  08/11/2020 4:59 PM    [facility]

## 2020-08-11 ENCOUNTER — Encounter: Payer: Self-pay | Admitting: Student

## 2020-08-11 ENCOUNTER — Ambulatory Visit (INDEPENDENT_AMBULATORY_CARE_PROVIDER_SITE_OTHER): Payer: 59 | Admitting: Student

## 2020-08-11 ENCOUNTER — Other Ambulatory Visit: Payer: Self-pay

## 2020-08-11 ENCOUNTER — Ambulatory Visit: Payer: 59 | Admitting: Physician Assistant

## 2020-08-11 VITALS — BP 112/62 | HR 73 | Ht 69.0 in | Wt 209.4 lb

## 2020-08-11 DIAGNOSIS — R002 Palpitations: Secondary | ICD-10-CM | POA: Diagnosis not present

## 2020-08-11 DIAGNOSIS — E785 Hyperlipidemia, unspecified: Secondary | ICD-10-CM

## 2020-08-11 DIAGNOSIS — I10 Essential (primary) hypertension: Secondary | ICD-10-CM

## 2020-08-11 DIAGNOSIS — I25119 Atherosclerotic heart disease of native coronary artery with unspecified angina pectoris: Secondary | ICD-10-CM

## 2020-08-11 DIAGNOSIS — R079 Chest pain, unspecified: Secondary | ICD-10-CM | POA: Diagnosis not present

## 2020-08-11 DIAGNOSIS — I480 Paroxysmal atrial fibrillation: Secondary | ICD-10-CM | POA: Diagnosis not present

## 2020-08-11 MED ORDER — METOPROLOL SUCCINATE ER 25 MG PO TB24: ORAL_TABLET | ORAL | 3 refills | Status: DC

## 2020-08-11 NOTE — Patient Instructions (Signed)
Medication Instructions:  Start Toprol XL 25 mg take 1/2 tablet ( 12.5 mg ) daily Continue all other medications *If you need a refill on your cardiac medications before your next appointment, please call your pharmacy*   Lab Work: Bmet,magnesium,tsh,cbc today   Testing/Procedures: Stress Myoview   Soon  30 day Event monitor will be mailed     Follow-Up: At [facility], you and your health needs are our priority.  As part of our continuing mission to provide you with exceptional heart care, we have created designated Provider Care Teams.  These Care Teams include your primary Cardiologist (physician) and Advanced Practice Providers (APPs -  Physician Assistants and Nurse Practitioners) who all work together to provide you with the care you need, when you need it.  We recommend signing up for the patient portal called "MyChart".  Sign up information is provided on this After Visit Summary.  MyChart is used to connect with patients for Virtual Visits (Telemedicine).  Patients are able to view lab/test results, encounter notes, upcoming appointments, etc.  Non-urgent messages can be sent to your provider as well.   To learn more about what you can do with MyChart, go to [email or web address].    Your next appointment:  After test   The format for your next appointment: Office    Provider:  Dr.Berry

## 2020-08-12 ENCOUNTER — Encounter: Payer: Self-pay | Admitting: *Deleted

## 2020-08-12 LAB — CBC WITH DIFFERENTIAL/PLATELET
Basophils Absolute: 0 10*3/uL (ref 0.0–0.2)
Basos: 1 %
EOS (ABSOLUTE): 0.1 10*3/uL (ref 0.0–0.4)
Eos: 2 %
Hematocrit: 43.7 % (ref 37.5–51.0)
Hemoglobin: 15.2 g/dL (ref 13.0–17.7)
Immature Grans (Abs): 0 10*3/uL (ref 0.0–0.1)
Immature Granulocytes: 0 %
Lymphocytes Absolute: 1.9 10*3/uL (ref 0.7–3.1)
Lymphs: 27 %
MCH: 31.2 pg (ref 26.6–33.0)
MCHC: 34.8 g/dL (ref 31.5–35.7)
MCV: 90 fL (ref 79–97)
Monocytes Absolute: 0.7 10*3/uL (ref 0.1–0.9)
Monocytes: 10 %
Neutrophils Absolute: 4.1 10*3/uL (ref 1.4–7.0)
Neutrophils: 60 %
Platelets: 217 10*3/uL (ref 150–450)
RBC: 4.87 x10E6/uL (ref 4.14–5.80)
RDW: 12.6 % (ref 11.6–15.4)
WBC: 6.8 10*3/uL (ref 3.4–10.8)

## 2020-08-12 LAB — TSH: TSH: 1.11 u[IU]/mL (ref 0.450–4.500)

## 2020-08-12 LAB — BASIC METABOLIC PANEL
BUN/Creatinine Ratio: 15 (ref 10–24)
BUN: 15 mg/dL (ref 8–27)
CO2: 21 mmol/L (ref 20–29)
Calcium: 9.1 mg/dL (ref 8.6–10.2)
Chloride: 104 mmol/L (ref 96–106)
Creatinine, Ser: 0.99 mg/dL (ref 0.76–1.27)
GFR calc Af Amer: 94 mL/min/{1.73_m2} (ref >59)
GFR calc non Af Amer: 81 mL/min/{1.73_m2} (ref >59)
Glucose: 126 mg/dL — ABNORMAL HIGH (ref 65–99)
Potassium: 4.4 mmol/L (ref 3.5–5.2)
Sodium: 141 mmol/L (ref 134–144)

## 2020-08-12 LAB — MAGNESIUM: Magnesium: 2.3 mg/dL (ref 1.6–2.3)

## 2020-08-12 NOTE — Progress Notes (Signed)
Patient ID: [name], male   DOB: 1956/01/07, 65 y.o.   MRN: [id number] Patient enrolled for [employer] to ship a 30 day cardiac event monitor to his home.  Instructions requested in [place]. Letter with instructions and word translation mailed to patient.

## 2020-08-13 ENCOUNTER — Telehealth: Payer: Self-pay | Admitting: Student

## 2020-08-13 ENCOUNTER — Encounter: Payer: Self-pay | Admitting: Student

## 2020-08-13 NOTE — Telephone Encounter (Signed)
LVM per patient request with details on Ex Stress Myoview appt., pre procedure COVID screening.

## 2020-08-13 NOTE — Telephone Encounter (Signed)
Opened in error

## 2020-08-19 ENCOUNTER — Encounter (INDEPENDENT_AMBULATORY_CARE_PROVIDER_SITE_OTHER): Payer: 59

## 2020-08-19 DIAGNOSIS — I480 Paroxysmal atrial fibrillation: Secondary | ICD-10-CM | POA: Diagnosis not present

## 2020-08-19 DIAGNOSIS — E785 Hyperlipidemia, unspecified: Secondary | ICD-10-CM

## 2020-08-19 DIAGNOSIS — R002 Palpitations: Secondary | ICD-10-CM | POA: Diagnosis not present

## 2020-08-19 DIAGNOSIS — R079 Chest pain, unspecified: Secondary | ICD-10-CM

## 2020-08-22 ENCOUNTER — Other Ambulatory Visit (HOSPITAL_COMMUNITY)
Admission: RE | Admit: 2020-08-22 | Discharge: 2020-08-22 | Disposition: A | Discharge status: Home / Self Care | Payer: 59 | Source: Ambulatory Visit | Attending: Student | Admitting: Student

## 2020-08-22 ENCOUNTER — Telehealth (HOSPITAL_COMMUNITY): Payer: Self-pay | Admitting: *Deleted

## 2020-08-22 DIAGNOSIS — Z01812 Encounter for preprocedural laboratory examination: Secondary | ICD-10-CM | POA: Diagnosis present

## 2020-08-22 DIAGNOSIS — Z20822 Contact with and (suspected) exposure to covid-19: Secondary | ICD-10-CM | POA: Insufficient documentation

## 2020-08-22 LAB — SARS CORONAVIRUS 2 (TAT 6-24 HRS): SARS Coronavirus 2: NEGATIVE

## 2020-08-22 NOTE — Telephone Encounter (Signed)
Close encounter 

## 2020-08-26 ENCOUNTER — Encounter (HOSPITAL_COMMUNITY): Payer: Self-pay | Admitting: *Deleted

## 2020-08-26 ENCOUNTER — Other Ambulatory Visit: Payer: Self-pay

## 2020-08-26 ENCOUNTER — Ambulatory Visit (HOSPITAL_COMMUNITY)
Admission: RE | Admit: 2020-08-26 | Discharge: 2020-08-26 | Disposition: A | Discharge status: Home / Self Care | Payer: 59 | Source: Ambulatory Visit | Attending: Internal Medicine | Admitting: Internal Medicine

## 2020-08-26 DIAGNOSIS — R079 Chest pain, unspecified: Secondary | ICD-10-CM | POA: Diagnosis not present

## 2020-08-26 LAB — MYOCARDIAL PERFUSION IMAGING
LV dias vol: 118 mL (ref 62–150)
LV sys vol: 56 mL
Peak HR: 92 {beats}/min
Rest HR: 62 {beats}/min
SDS: 2
SRS: 4
SSS: 5
TID: 1.16

## 2020-08-26 MED ORDER — REGADENOSON 0.4 MG/5ML IV SOLN
0.4000 mg | Freq: Once | INTRAVENOUS | Status: AC
  Administered 2020-08-26: 0.4 mg via INTRAVENOUS

## 2020-08-26 MED ORDER — TECHNETIUM TC 99M TETROFOSMIN IV KIT
10.1000 | PACK | Freq: Once | INTRAVENOUS | Status: AC | PRN
  Administered 2020-08-26: 10.1 via INTRAVENOUS
  Filled 2020-08-26: qty 11

## 2020-08-26 MED ORDER — TECHNETIUM TC 99M TETROFOSMIN IV KIT
30.7000 | PACK | Freq: Once | INTRAVENOUS | Status: AC | PRN
  Administered 2020-08-26: 30.7 via INTRAVENOUS
  Filled 2020-08-26: qty 31

## 2020-08-26 NOTE — Progress Notes (Signed)
Rosalyn Charters assisted with interpretation services during an MPI study done today, August 26, 2020 for this pt.

## 2020-08-31 ENCOUNTER — Other Ambulatory Visit: Payer: Self-pay | Admitting: Internal Medicine

## 2020-09-02 ENCOUNTER — Other Ambulatory Visit: Payer: Self-pay | Admitting: Internal Medicine

## 2020-09-03 ENCOUNTER — Telehealth: Payer: Self-pay | Admitting: Internal Medicine

## 2020-09-03 MED ORDER — DOXAZOSIN MESYLATE 8 MG PO TABS: 4.0000 mg | ORAL_TABLET | Freq: Two times a day (BID) | ORAL | 0 refills | Status: DC

## 2020-09-03 NOTE — Telephone Encounter (Signed)
Patient is requesting a call back. He is wanting to get a refill for doxazosin (CARDURA) 8 MG tablet. Explained to him that he will need an appt for further refills and he requested a call back. He can be reached at [phone]. Please advise

## 2020-09-03 NOTE — Telephone Encounter (Signed)
Called pt there was no answer LMOM will send 30 day supply only to [facility]. Must be seen within next 30 days for future refills.Marland KitchenJohny Chess

## 2020-09-23 ENCOUNTER — Ambulatory Visit: Payer: 59 | Admitting: Internal Medicine

## 2020-09-24 ENCOUNTER — Ambulatory Visit: Payer: 59 | Admitting: Internal Medicine

## 2020-09-25 ENCOUNTER — Encounter: Payer: Self-pay | Admitting: Internal Medicine

## 2020-09-29 ENCOUNTER — Encounter: Payer: Self-pay | Admitting: Internal Medicine

## 2020-09-29 ENCOUNTER — Ambulatory Visit (INDEPENDENT_AMBULATORY_CARE_PROVIDER_SITE_OTHER): Payer: 59 | Admitting: Internal Medicine

## 2020-09-29 ENCOUNTER — Other Ambulatory Visit: Payer: Self-pay

## 2020-09-29 DIAGNOSIS — G44209 Tension-type headache, unspecified, not intractable: Secondary | ICD-10-CM | POA: Diagnosis not present

## 2020-09-29 DIAGNOSIS — M542 Cervicalgia: Secondary | ICD-10-CM

## 2020-09-29 DIAGNOSIS — E538 Deficiency of other specified B group vitamins: Secondary | ICD-10-CM | POA: Diagnosis not present

## 2020-09-29 MED ORDER — DOXAZOSIN MESYLATE 8 MG PO TABS: 4.0000 mg | ORAL_TABLET | Freq: Two times a day (BID) | ORAL | 3 refills | Status: DC

## 2020-09-29 MED ORDER — BUTALBITAL-APAP-CAFF-COD 50-325-40-30 MG PO CAPS: 1.0000 | ORAL_CAPSULE | Freq: Four times a day (QID) | ORAL | 0 refills | Status: DC | PRN

## 2020-09-29 NOTE — Assessment & Plan Note (Addendum)
Cont w/B12 

## 2020-09-29 NOTE — Assessment & Plan Note (Signed)
Stress related neck pain/HA. Rice sock. Fioricet w/cod prn  Potential benefits of a long/short term opioids use as well as potential risks (i.e. addiction risk, apnea etc) and complications (i.e. Somnolence, constipation and others) were explained to the patient and were aknowledged.

## 2020-09-29 NOTE — Patient Instructions (Signed)
???????? ???? ?????????? ? ???????? Tension Headache, Adult ???????? ???? ??????????? ??? ??????? ????, ???????? ??? ?????? ????????, ???????????? ? ?????? ????? ?????? ? ?? ????? ??????. ??? ???? ????? ???? ????? ??? ????? ????????? ??? ??????????. ?????????? ??? ???? ???????? ???? ??????????:  ????????????? ???????? ???? ??????????. ??? ????? ???????? ???? ????????? ? ??????? ????? 15???? ? ?????.  ??????????? ???????? ???? ??????????. ??? ????? ???????? ???? ????????? ? ??????? ????? 15???? ? ????? ?? ?????????? 3???????????????? ???????. ???????? ???? ?????????? ????? ??????? ?? 30 ????? ?? ?????????? ????. ??? ???????? ???????????????? ??? ???????? ????. ???????? ???? ?????????? ?????? ?? ??????? ? ???????? ??? ??????, ? ??? ?? ???????????? ??? ?????????? ????????. ?????? ???????? ?????? ??????? ????? ????????? ??????????. ???????? ???? ?????????? ????? ??????????? ??????, ??????? ??? ?????????. ????? ?????? ????????????? ???????? ????? ????:  ????????.  ?????????? ????? ??????? ??? ??????? ?????? ???????.  ????????????? ????????, ????? ??? ????????, ????? ??? ???????.  ??????????? ????? ??? ????????? ??????????? ?????.  ?????????? ????????????.  ?????????? ??????????? ?????? ? ??? ? ????? ??? ?? ????, ????????, ?? ???????????.  ????????.  ?????? ???. ?????? ???????? ??? ????????? ?????????? ????? ????????? ????????:  ???????? ??????????? ??? ??????????? ??????.  ?????, ??????? ???????? ????.  ???? ??? ???? ? ?? ????? ??????.  ????????????? ???? ??????, ??? ? ????. ??? ??? ?????????????? ??? ????????? ????? ??????????????? ?? ????????? ????? ?????????, ?????? ??????? ??????? ? ???????????? ????????????. ???? ???????? ??????? ??? ?????????, ??? ????? ????????? ???????????????? ????????????, ???????? ?? (???????????? ??????????) ??? ???-???????????? ??????. ????? ????? ????????? ???? ??????. ??? ??? ?????? ??? ?????????? ????????? ????? ????????? ????? ???????????????  ?????? ?? ????????? ?????? ????? ? ?????????? ????????????? ??????????. ???????? ???? ?????????? ????????? ??????????: ??????????? ????  ?????????? ?????????????? ? ??????????? ????????????? ????????? ?????? ??? ??????? ????? ??????? ??????.  ????? ???????? ???????? ????, ??????? ???? ? ?????? ????? ???????.  ???? ??? ?????????, ????????????? ??? ? ?????? ? ???. ??? ???? ????: ? ???????? ??? ? ??????????? ?????. ? ?????????? ????????? ????? ????? ????? ? ???????. ? ????????????? ????? ?? ????? ?? 20????? 2-3???? ? ????. ? ???? ???? ?????????? ????-???????, ??????? ???. ??? ????? ?????. ???? ?? ?? ?????????? ????, ????? ??? ?????, ?? ?????????? ??????????? ????? ??????????? ???? ???????.  ???? ??? ?????????, ????????????? ????? ? ??????? ??? ?????, ??? ??????? ????? ??????? ??????. ??????????? ???????? ?????, ??????????????? ????? ??????? ??????, ???????? ??????? ???????? ??? ??????. ? ?????????? ????????? ????? ????? ????? ? ???????. ? ????????????? ?????? ?? 20-30?????. ? ???? ???? ?????????? ????-???????, ??????? ??????. ??? ???????? ?????, ???? ?? ?? ???????? ??????????? ????, ????? ??? ?????. ? ??? ??????? ???? ????????? ??????. ??? ? ???????  ????????? ?????????.  ???? ?? ????? ????????: ? ?????????? ???????????? ?????????? ??:  0-1 ?????? ? ???? ??? ??????, ??????? ?? ?????????.  0-2?????? ? ???? ??? ??????. ? ??????? ?? ???, ??????? ???????? ?????????? ? ????? ??????. ? ??? ???? ?????? ????????????? ????? ??????? ???? ??????? 12????? (355??), ?????? ?????? ???? ??????? 5????? (148??) ??? ????? ????? ???????? ??????? ??????? 1????? (44??).  ????? ?????????? ????????, ????? ???? ???? ?????????? ??????-??????? ?????.  ????????? ??????????? ??? ?????????? ????????????? ???????. ????? ?????  ????? ?? 7 ?? 9????? ?????? ???? ??? ???????, ??????? ????????????? ????? ??????? ??????.  ??????? ??????????, ???????? ? ???????? ?? ????? ??????? ????? ???, ??? ????????  ?????.  ??????? ?????? ??????????? ?? ????????. ??? ????? ????: ? ?????????? ??????????. ? ???????? ???????. ? ????. ? ????????????? ??????. ? ????????????? ??????????? ??????.  ?????????? ?????? ????? ? ????????? ?????????? ????.  ?? ???????????? ??????, ??? ???????? ??????? ??? ?????. ? ????? ????????? ????????, ??????????? ????? ? ?????????? ??? ????????, ????? ??? ??????????? ????????. ???? ??? ?????? ?? ???? ???????? ??? ????? ??????, ?????????? ? ?????? ???????? ?????. ????? ????????  ????????? ????????, ????????????? ???????? ????. ?????? ???????, ????? ????????, ??? ????? ????????????? ? ??? ???????? ????. ????????, ???????????: ? ??? ?? ??? ? ????. ? ??????? ?? ?????. ? ????? ????????? ? ??????? ??????? ??? ??????????.  ????????? ?? ??? ?????? ???????????? ??????????. ??? ?????.   ?????????? ? ???????? ?????, ????:  ???? ???????? ???? ?? ????????.  ???????? ???? ????????.  ??-?? ???????? ???? ?? ?????????? ?????????? ?? ?????, ???? ??? ??????.  ? ??? ??????? ??? ? ??? ?????.  ? ??? ????? ???????. ?????????? ?????????? ?? ???????, ????:  ? ??? ???????? ????????? ??????? ???????? ????, ??????? ?????????????? ?????????? ?????????: ? ??????????????? ???. ? ??????? ? ?????. ? ??????????? ????????. ? ???????? ? ????? ????? ???? ??? ? ????? ??????? ?????? ????. ? ??????? ? ?????? ??? ?????? ??????. ? ??????. ? ????. ? ????????? ??????????. ? ????????? ??? ?????. ? ???????????? ????. ? ???? ? ????? ????? ??? ???. ? ??????????? ??? ?????? ??? ? ?????????? ??????????. ? ?????????????? ????????? ??? ???????. ??????  ???????? ???? ??????????? ??? ??????? ????, ???????? ??? ?????? ????????, ???????????? ? ?????? ????? ?????? ? ?? ????? ??????.  ???????? ???? ?????????? ????? ??????? ??  30 ????? ?? ?????????? ????. ??? ???????? ???????????????? ??? ???????? ????.  ??? ????????? ????? ??????????????? ?? ????????? ????? ?????????, ?????? ??????? ??????? ? ????????????  ????????????.  ??? ?????????? ????????? ????? ????????? ????? ??????????????? ?????? ?? ????????? ?????? ????? ? ?????????? ????????????? ??????????. ??? ?????????? ?? ????? ???????? ??????, ??????????????? ????? ??????. ??????????? ???????? ????? ???????????? ??? ??????? ? ????? ??????? ??????. Document Revised: 04/15/2020 Document Reviewed: 04/15/2020 Elsevier Patient Education  2021 [employer].

## 2020-09-29 NOTE — Progress Notes (Signed)
Subjective:  Patient ID: [name], male    DOB: 03-14-1956  Age: 65 y.o. MRN: [id number]  CC: Medication Refill   HPI [name] presents for HA and pain between shoulder blades. His mother just died. Worse HAs/sx's at night x 2 days.  Outpatient Medications Prior to Visit  Medication Sig Dispense Refill  . amLODipine (NORVASC) 5 MG tablet Take 1 tablet (5 mg total) by mouth daily. 90 tablet 3  . aspirin EC 81 MG tablet Take 1 tablet (81 mg total) by mouth daily. 30 tablet 0  . b complex vitamins tablet Take 1 tablet by mouth daily. 100 tablet 3  . benzonatate (TESSALON) 200 MG capsule Take 1 capsule (200 mg total) by mouth 3 (three) times daily as needed for cough. 30 capsule 5  . Cholecalciferol (VITAMIN D3) 50 MCG (2000 UT) capsule Take 1 capsule (2,000 Units total) by mouth daily. 100 capsule 3  . doxazosin (CARDURA) 8 MG tablet Take 0.5 tablets (4 mg total) by mouth 2 (two) times daily. Overdue for Annual appt must see provider for future refills 30 tablet 0  . Evolocumab (REPATHA SURECLICK) 381 MG/ML SOAJ Inject 140 mg into the skin every 14 (fourteen) days. 2.1 mL 11  . finasteride (PROSCAR) 5 MG tablet Take 1 tablet (5 mg total) by mouth daily. 90 tablet 3  . metoprolol succinate (TOPROL XL) 25 MG 24 hr tablet Take 1/2 tablet ( 12.5 mg ) daily 45 tablet 3  . pantoprazole (PROTONIX) 40 MG tablet Take 1 tablet (40 mg total) by mouth daily. 90 tablet 3  . albuterol (PROAIR HFA) 108 (90 Base) MCG/ACT inhaler Inhale 2 puffs into the lungs every 6 (six) hours as needed for wheezing or shortness of breath. (Patient not taking: Reported on 09/29/2020) 18 g 5   No facility-administered medications prior to visit.    ROS: Review of Systems  Constitutional: Negative for appetite change, fatigue and unexpected weight change.  HENT: Negative for congestion, nosebleeds, sneezing, sore throat and trouble swallowing.   Eyes: Negative for itching and visual disturbance.   Respiratory: Negative for cough.   Cardiovascular: Negative for chest pain, palpitations and leg swelling.  Gastrointestinal: Negative for abdominal distention, blood in stool, diarrhea and nausea.  Genitourinary: Negative for frequency and hematuria.  Musculoskeletal: Positive for neck pain. Negative for back pain, gait problem and joint swelling.  Skin: Negative for rash.  Neurological: Negative for dizziness, tremors, speech difficulty and weakness.  Psychiatric/Behavioral: Negative for agitation, dysphoric mood, sleep disturbance and suicidal ideas. The patient is not nervous/anxious.     Objective:  BP 120/72 (BP Location: Left Arm)   Pulse 73   Temp 98.8 F (37.1 C) (Oral)   Ht 5\' 9"  (1.753 m)   Wt 205 lb (93 kg)   SpO2 96%   BMI 30.27 kg/m   BP Readings from Last 3 Encounters:  09/29/20 120/72  08/11/20 112/62  07/16/20 108/62    Wt Readings from Last 3 Encounters:  09/29/20 205 lb (93 kg)  08/26/20 209 lb (94.8 kg)  08/11/20 209 lb 6.4 oz (95 kg)    Physical Exam  Lab Results  Component Value Date   WBC 6.8 08/11/2020   HGB 15.2 08/11/2020   HCT 43.7 08/11/2020   PLT 217 08/11/2020   GLUCOSE 126 (H) 08/11/2020   CHOL 127 06/17/2020   TRIG 122.0 06/17/2020   HDL 56.60 06/17/2020   LDLDIRECT 110.0 04/15/2017   LDLCALC 46 06/17/2020   ALT  56 (H) 06/17/2020   AST 35 06/17/2020   NA 141 08/11/2020   K 4.4 08/11/2020   CL 104 08/11/2020   CREATININE 0.99 08/11/2020   BUN 15 08/11/2020   CO2 21 08/11/2020   TSH 1.110 08/11/2020   PSA 0.07 (L) 06/17/2020   INR 1.0 03/10/2016   HGBA1C 5.7 07/30/2015    Myocardial Perfusion Imaging  Result Date: 08/26/2020  Nuclear stress EF: 53%.  The left ventricular ejection fraction is mildly decreased (45-54%).  There was no ST segment deviation noted during stress.  Defect 1: There is a small defect of mild severity present in the basal anterior and mid anterior location.  Defect 2: There is a large defect of  severe severity present in the basal inferior and mid inferior location.  Findings consistent with ischemia.  This is a low risk study.  Abnormal, low risk stress nuclear study with prominent diaphragmatic attenuation/inferior thinning and very mild ischemia in the mid anterior wall.  Gated ejection fraction low normal at 53%.    Assessment & Plan:    Walker Kehr, MD

## 2020-09-30 NOTE — Telephone Encounter (Signed)
Submitted PA via cover-my-meds w/ Key: [id number]. Waiting on insurance response.Marland KitchenJohny Benson

## 2020-10-10 ENCOUNTER — Other Ambulatory Visit: Payer: Self-pay

## 2020-10-10 ENCOUNTER — Ambulatory Visit (INDEPENDENT_AMBULATORY_CARE_PROVIDER_SITE_OTHER): Payer: 59 | Admitting: Cardiovascular Disease

## 2020-10-10 ENCOUNTER — Encounter: Payer: Self-pay | Admitting: Cardiovascular Disease

## 2020-10-10 DIAGNOSIS — E785 Hyperlipidemia, unspecified: Secondary | ICD-10-CM | POA: Diagnosis not present

## 2020-10-10 DIAGNOSIS — I2510 Atherosclerotic heart disease of native coronary artery without angina pectoris: Secondary | ICD-10-CM

## 2020-10-10 DIAGNOSIS — I10 Essential (primary) hypertension: Secondary | ICD-10-CM | POA: Diagnosis not present

## 2020-10-10 DIAGNOSIS — Z9861 Coronary angioplasty status: Secondary | ICD-10-CM | POA: Diagnosis not present

## 2020-10-10 NOTE — Assessment & Plan Note (Signed)
History of CAD status post cardiac catheterization which I performed 02/05/2016 via the right radial approach revealing normal left system with a moderate hypodense mid dominant RCA stenosis.  He developed ventricular fibrillation after the third coronary injection requiring CPR and defibrillation.  Because of ongoing chest pain I performed PCI and stenting of his mid RCA via the femoral approach 03/10/2016 which was uncomplicated.  He had a recent Myoview stress test performed because of atypical chest pain 08/26/2020 that showed diaphragmatic attenuation without ischemia.

## 2020-10-10 NOTE — Patient Instructions (Signed)

## 2020-10-10 NOTE — Assessment & Plan Note (Signed)
History of essential hypertension a blood pressure measured at 120/74.  He is on amlodipine and metoprolol.

## 2020-10-10 NOTE — Progress Notes (Signed)
10/10/2020 ETHIN DRUMMOND   1955/10/01  [id number]  Primary Physician Plotnikov, Evie Lacks, MD Primary Cardiologist: Lorretta Harp MD [name], [place]  HPI:  [name] is a 65 y.o.  single [place] male whose mother is a patient of mine. I last saw him in the office 02/12/2020. He has a history of treated hypertension, hyperlipidemia and family history for heart disease. He is complained of chest pain off and on for several years. Somewhat exertional. He did have a stress test done in January that was low risk. He's had a persistent cough over the last 6 months. Recent chest CT performed 01/08/16 showed no significant pulmonary abnormalities but there was a small pericardial effusion and calcium in the LAD territory. He underwent outpatient cardiac catheterization on 02/05/16 B right radial approach revealing a normal left system with moderate hypodense mid dominant RCA stenosis. He developed ventricular fibrillation after the third coronary injection requiring CPR and defibrillation. Because of ongoing chest pain I performed PCI and drug-eluting stenting of his mid RCAvia theright femoral approach 03/10/16.The procedurewas uncomplicated. He continues to have atypical chest pain. His PCP related discontinue the statin drug for presumed "statin intolerance".  He does have some mild exertional substernal chest pain which is no change in frequency and severity over the prior year.  Since I saw him 8 months ago he unfortunately lost his mother who is also a patient of mine.  He still complains of some occasional atypical chest pain and palpitations although recent Myoview showed diaphragmatic attenuation without ischemia performed 08/26/2020 and an event monitor showed no arrhythmias.   Current Meds  Medication Sig  . amLODipine (NORVASC) 5 MG tablet Take 1 tablet (5 mg total) by mouth daily.  Marland Kitchen aspirin EC 81 MG tablet Take 1 tablet (81 mg total) by mouth daily.  Marland Kitchen b  complex vitamins tablet Take 1 tablet by mouth daily.  . benzonatate (TESSALON) 200 MG capsule Take 1 capsule (200 mg total) by mouth 3 (three) times daily as needed for cough.  . butalbital-acetaminophen-caffeine (FIORICET WITH CODEINE) 50-325-40-30 MG capsule Take 1 capsule by mouth every 6 (six) hours as needed for headache.  . Cholecalciferol (VITAMIN D3) 50 MCG (2000 UT) capsule Take 1 capsule (2,000 Units total) by mouth daily.  Marland Kitchen doxazosin (CARDURA) 8 MG tablet Take 0.5 tablets (4 mg total) by mouth 2 (two) times daily. Overdue for Annual appt must see provider for future refills  . Evolocumab (REPATHA SURECLICK) 622 MG/ML SOAJ Inject 140 mg into the skin every 14 (fourteen) days.  . finasteride (PROSCAR) 5 MG tablet Take 1 tablet (5 mg total) by mouth daily.  . metoprolol succinate (TOPROL XL) 25 MG 24 hr tablet Take 1/2 tablet ( 12.5 mg ) daily  . pantoprazole (PROTONIX) 40 MG tablet Take 1 tablet (40 mg total) by mouth daily.     Allergies  Allergen Reactions  . Flomax [Tamsulosin Hcl] Other (See Comments)    Hard time breathing  . Atorvastatin Other (See Comments)    REACTION: leg cramps per patient with a large dose    Social History   Socioeconomic History  . Marital status: Divorced    Spouse name: Not on file  . Number of children: 1  . Years of education: Not on file  . Highest education level: Not on file  Occupational History  . Occupation: Animal nutritionist  Tobacco Use  . Smoking status: Never Smoker  . Smokeless tobacco: Never Used  Vaping Use  . Vaping Use: Never used  Substance and Sexual Activity  . Alcohol use: Not Currently  . Drug use: No  . Sexual activity: Never    Birth control/protection: Abstinence  Other Topics Concern  . Not on file  Social History Narrative   Coffee daily    Social Determinants of Health   Financial Resource Strain: Not on file  Food Insecurity: Not on file  Transportation Needs: Not on file  Physical Activity: Not on  file  Stress: Not on file  Social Connections: Not on file  Intimate Partner Violence: Not on file     Review of Systems: General: negative for chills, fever, night sweats or weight changes.  Cardiovascular: negative for chest pain, dyspnea on exertion, edema, orthopnea, palpitations, paroxysmal nocturnal dyspnea or shortness of breath Dermatological: negative for rash Respiratory: negative for cough or wheezing Urologic: negative for hematuria Abdominal: negative for nausea, vomiting, diarrhea, bright red blood per rectum, melena, or hematemesis Neurologic: negative for visual changes, syncope, or dizziness All other systems reviewed and are otherwise negative except as noted above.    Blood pressure 120/74, pulse 72, height 5\' 9"  (1.753 m), weight 209 lb (94.8 kg).  General appearance: alert and no distress Neck: no adenopathy, no carotid bruit, no JVD, supple, symmetrical, trachea midline and thyroid not enlarged, symmetric, no tenderness/mass/nodules Lungs: clear to auscultation bilaterally Heart: regular rate and rhythm, S1, S2 normal, no murmur, click, rub or gallop Extremities: extremities normal, atraumatic, no cyanosis or edema Pulses: 2+ and symmetric Skin: Skin color, texture, turgor normal. No rashes or lesions Neurologic: Alert and oriented X 3, normal strength and tone. Normal symmetric reflexes. Normal coordination and gait  EKG not performed today  ASSESSMENT AND PLAN:   Dyslipidemia History of dyslipidemia on Repatha with lipid profile performed 06/09/2020 revealing total cholesterol 127, LDL of 46 and HDL of 56.  Essential hypertension History of essential hypertension a blood pressure measured at 120/74.  He is on amlodipine and metoprolol.  CAD S/P percutaneous coronary angioplasty History of CAD status post cardiac catheterization which I performed 02/05/2016 via the right radial approach revealing normal left system with a moderate hypodense mid dominant  RCA stenosis.  He developed ventricular fibrillation after the third coronary injection requiring CPR and defibrillation.  Because of ongoing chest pain I performed PCI and stenting of his mid RCA via the femoral approach 03/10/2016 which was uncomplicated.  He had a recent Myoview stress test performed because of atypical chest pain 08/26/2020 that showed diaphragmatic attenuation without ischemia.      Lorretta Harp MD FACP,FACC,FAHA, [facility] 10/10/2020 4:06 PM

## 2020-10-10 NOTE — Assessment & Plan Note (Addendum)
History of dyslipidemia on Repatha with lipid profile performed 06/09/2020 revealing total cholesterol 127, LDL of 46 and HDL of 56.

## 2020-10-20 NOTE — Telephone Encounter (Signed)
I sent a note to [name] to see if we can determine when his 12 month card is up, or if it's all calendar year.

## 2020-10-27 ENCOUNTER — Other Ambulatory Visit: Payer: Self-pay | Admitting: Cardiovascular Disease

## 2020-10-27 NOTE — Telephone Encounter (Signed)
Rx(s) sent to pharmacy electronically.  

## 2020-10-29 ENCOUNTER — Telehealth: Payer: Self-pay

## 2020-10-29 NOTE — Telephone Encounter (Signed)
Called pt to let them know that the tier exception is denied and that we can also try applying for pt assistance and the pt voiced understanding and mailed out pt assistance forms and asked the pt to mail those back into [place] using the return address on the envelope

## 2020-10-29 NOTE — Telephone Encounter (Signed)
-----   Message from [place], [place] sent at 10/23/2020 12:51 PM EDT ----- Regarding: check on teir exception for repatha Check on teir exception

## 2021-02-18 NOTE — Telephone Encounter (Signed)
Called and lmomed the pt to let them know that they are able to get the med at $4.99 for an 84 day supply.

## 2021-02-19 NOTE — Telephone Encounter (Signed)
Pt called in to let me know that he picked up the 3 month supply of the med yesterday

## 2021-03-27 ENCOUNTER — Other Ambulatory Visit: Payer: Self-pay | Admitting: Internal Medicine

## 2021-04-27 ENCOUNTER — Other Ambulatory Visit: Payer: Self-pay | Admitting: Cardiovascular Disease

## 2021-05-12 ENCOUNTER — Other Ambulatory Visit: Payer: Self-pay | Admitting: Cardiovascular Disease

## 2021-05-12 DIAGNOSIS — E785 Hyperlipidemia, unspecified: Secondary | ICD-10-CM

## 2021-05-12 DIAGNOSIS — I2510 Atherosclerotic heart disease of native coronary artery without angina pectoris: Secondary | ICD-10-CM

## 2021-05-12 MED ORDER — REPATHA SURECLICK 140 MG/ML ~~LOC~~ SOAJ: 1.0000 mL | SUBCUTANEOUS | 1 refills | Status: DC

## 2021-07-02 ENCOUNTER — Encounter: Payer: Self-pay | Admitting: Internal Medicine

## 2021-07-02 ENCOUNTER — Encounter: Payer: Self-pay | Admitting: Cardiovascular Disease

## 2021-07-02 DIAGNOSIS — I10 Essential (primary) hypertension: Secondary | ICD-10-CM

## 2021-07-02 DIAGNOSIS — E538 Deficiency of other specified B group vitamins: Secondary | ICD-10-CM

## 2021-07-02 DIAGNOSIS — Z125 Encounter for screening for malignant neoplasm of prostate: Secondary | ICD-10-CM

## 2021-07-02 DIAGNOSIS — E785 Hyperlipidemia, unspecified: Secondary | ICD-10-CM

## 2021-07-02 DIAGNOSIS — Z0000 Encounter for general adult medical examination without abnormal findings: Secondary | ICD-10-CM

## 2021-07-02 NOTE — Telephone Encounter (Signed)
Patient needs OV for concerns. Sent pt my chart message.

## 2021-07-03 NOTE — Telephone Encounter (Signed)
Patient is requesting order for labs prior to CPE appt om 07-21-2021

## 2021-07-14 ENCOUNTER — Other Ambulatory Visit (INDEPENDENT_AMBULATORY_CARE_PROVIDER_SITE_OTHER): Payer: 59

## 2021-07-14 ENCOUNTER — Other Ambulatory Visit: Payer: Self-pay

## 2021-07-14 DIAGNOSIS — E785 Hyperlipidemia, unspecified: Secondary | ICD-10-CM

## 2021-07-14 DIAGNOSIS — Z0000 Encounter for general adult medical examination without abnormal findings: Secondary | ICD-10-CM | POA: Diagnosis not present

## 2021-07-14 DIAGNOSIS — I10 Essential (primary) hypertension: Secondary | ICD-10-CM | POA: Diagnosis not present

## 2021-07-14 DIAGNOSIS — Z125 Encounter for screening for malignant neoplasm of prostate: Secondary | ICD-10-CM | POA: Diagnosis not present

## 2021-07-14 LAB — CBC WITH DIFFERENTIAL/PLATELET
Basophils Absolute: 0 10*3/uL (ref 0.0–0.1)
Basophils Relative: 0.7 % (ref 0.0–3.0)
Eosinophils Absolute: 0.2 10*3/uL (ref 0.0–0.7)
Eosinophils Relative: 3 % (ref 0.0–5.0)
HCT: 41.6 % (ref 39.0–52.0)
Hemoglobin: 14.3 g/dL (ref 13.0–17.0)
Lymphocytes Relative: 28 % (ref 12.0–46.0)
Lymphs Abs: 1.6 10*3/uL (ref 0.7–4.0)
MCHC: 34.4 g/dL (ref 30.0–36.0)
MCV: 90.2 fl (ref 78.0–100.0)
Monocytes Absolute: 0.6 10*3/uL (ref 0.1–1.0)
Monocytes Relative: 11 % (ref 3.0–12.0)
Neutro Abs: 3.2 10*3/uL (ref 1.4–7.7)
Neutrophils Relative %: 57.3 % (ref 43.0–77.0)
Platelets: 205 10*3/uL (ref 150.0–400.0)
RBC: 4.61 Mil/uL (ref 4.22–5.81)
RDW: 12.3 % (ref 11.5–15.5)
WBC: 5.5 10*3/uL (ref 4.0–10.5)

## 2021-07-14 LAB — URINALYSIS, ROUTINE W REFLEX MICROSCOPIC
Bilirubin Urine: NEGATIVE
Hgb urine dipstick: NEGATIVE
Leukocytes,Ua: NEGATIVE
Nitrite: NEGATIVE
RBC / HPF: NONE SEEN (ref 0–?)
Specific Gravity, Urine: 1.01 (ref 1.000–1.030)
Total Protein, Urine: NEGATIVE
Urine Glucose: >=1000 — AB
Urobilinogen, UA: 0.2 (ref 0.0–1.0)
pH: 7 (ref 5.0–8.0)

## 2021-07-14 LAB — COMPREHENSIVE METABOLIC PANEL
ALT: 54 U/L — ABNORMAL HIGH (ref 0–53)
AST: 32 U/L (ref 0–37)
Albumin: 4.3 g/dL (ref 3.5–5.2)
Alkaline Phosphatase: 69 U/L (ref 39–117)
BUN: 12 mg/dL (ref 6–23)
CO2: 26 mEq/L (ref 19–32)
Calcium: 9.2 mg/dL (ref 8.4–10.5)
Chloride: 99 mEq/L (ref 96–112)
Creatinine, Ser: 1.01 mg/dL (ref 0.40–1.50)
GFR: 79.1 mL/min (ref >60.00)
Glucose, Bld: 356 mg/dL — ABNORMAL HIGH (ref 70–99)
Potassium: 4.2 mEq/L (ref 3.5–5.1)
Sodium: 134 mEq/L — ABNORMAL LOW (ref 135–145)
Total Bilirubin: 1 mg/dL (ref 0.2–1.2)
Total Protein: 6.9 g/dL (ref 6.0–8.3)

## 2021-07-14 LAB — LIPID PANEL
Cholesterol: 201 mg/dL — ABNORMAL HIGH (ref 0–200)
HDL: 55.1 mg/dL (ref >39.00)
LDL Cholesterol: 110 mg/dL — ABNORMAL HIGH (ref 0–99)
NonHDL: 145.82
Total CHOL/HDL Ratio: 4
Triglycerides: 181 mg/dL — ABNORMAL HIGH (ref 0.0–149.0)
VLDL: 36.2 mg/dL (ref 0.0–40.0)

## 2021-07-14 LAB — PSA: PSA: 0.05 ng/mL — ABNORMAL LOW (ref 0.10–4.00)

## 2021-07-14 LAB — TSH: TSH: 3.01 u[IU]/mL (ref 0.35–5.50)

## 2021-07-21 ENCOUNTER — Encounter: Payer: Self-pay | Admitting: Internal Medicine

## 2021-07-21 ENCOUNTER — Ambulatory Visit (INDEPENDENT_AMBULATORY_CARE_PROVIDER_SITE_OTHER): Payer: 59 | Admitting: Internal Medicine

## 2021-07-21 ENCOUNTER — Other Ambulatory Visit: Payer: Self-pay

## 2021-07-21 VITALS — BP 102/58 | HR 75 | Temp 98.1°F | Ht 69.0 in | Wt 193.8 lb

## 2021-07-21 DIAGNOSIS — I2510 Atherosclerotic heart disease of native coronary artery without angina pectoris: Secondary | ICD-10-CM | POA: Diagnosis not present

## 2021-07-21 DIAGNOSIS — Z9861 Coronary angioplasty status: Secondary | ICD-10-CM

## 2021-07-21 DIAGNOSIS — E0865 Diabetes mellitus due to underlying condition with hyperglycemia: Secondary | ICD-10-CM | POA: Insufficient documentation

## 2021-07-21 DIAGNOSIS — E785 Hyperlipidemia, unspecified: Secondary | ICD-10-CM | POA: Diagnosis not present

## 2021-07-21 DIAGNOSIS — F4321 Adjustment disorder with depressed mood: Secondary | ICD-10-CM

## 2021-07-21 LAB — COMPREHENSIVE METABOLIC PANEL
ALT: 50 U/L (ref 0–53)
AST: 34 U/L (ref 0–37)
Albumin: 4.2 g/dL (ref 3.5–5.2)
Alkaline Phosphatase: 63 U/L (ref 39–117)
BUN: 15 mg/dL (ref 6–23)
CO2: 25 mEq/L (ref 19–32)
Calcium: 9.1 mg/dL (ref 8.4–10.5)
Chloride: 100 mEq/L (ref 96–112)
Creatinine, Ser: 1.15 mg/dL (ref 0.40–1.50)
GFR: 67.68 mL/min (ref >60.00)
Glucose, Bld: 320 mg/dL — ABNORMAL HIGH (ref 70–99)
Potassium: 4.4 mEq/L (ref 3.5–5.1)
Sodium: 132 mEq/L — ABNORMAL LOW (ref 135–145)
Total Bilirubin: 0.5 mg/dL (ref 0.2–1.2)
Total Protein: 7 g/dL (ref 6.0–8.3)

## 2021-07-21 LAB — HEMOGLOBIN A1C: Hgb A1c MFr Bld: 12.8 % — ABNORMAL HIGH (ref 4.6–6.5)

## 2021-07-21 MED ORDER — ONETOUCH VERIO W/DEVICE KIT: 1.0000 [IU] | PACK | Freq: Every day | 1 refills | Status: AC | PRN

## 2021-07-21 MED ORDER — ONETOUCH VERIO VI STRP: ORAL_STRIP | 11 refills | Status: AC

## 2021-07-21 MED ORDER — REPAGLINIDE 1 MG PO TABS: 1.0000 mg | ORAL_TABLET | Freq: Three times a day (TID) | ORAL | 5 refills | Status: DC

## 2021-07-21 MED ORDER — ONETOUCH ULTRASOFT LANCETS MISC: 12 refills | Status: AC

## 2021-07-21 MED ORDER — METFORMIN HCL 500 MG PO TABS
500.0000 mg | ORAL_TABLET | Freq: Every day | ORAL | 5 refills | Status: DC
Start: 2021-07-21 — End: 2022-01-11

## 2021-07-21 NOTE — Assessment & Plan Note (Addendum)
Has not started Repatha - Start Repatha Goal LDL<70

## 2021-07-21 NOTE — Assessment & Plan Note (Addendum)
New onset A1c today Start Prandin, Metformin Diet discussed

## 2021-07-21 NOTE — Progress Notes (Signed)
Subjective:  Patient ID: [name], male    DOB: 30-Jun-1955  Age: 66 y.o. MRN: [id number]  CC: Annual Exam   HPI MAJID MCCRAVY presents for polyuria, polydipsia x 4 weeks. C/o wt loss.  Outpatient Medications Prior to Visit  Medication Sig Dispense Refill   amLODipine (NORVASC) 5 MG tablet Take 1 tablet by mouth once daily 90 tablet 3   aspirin EC 81 MG tablet Take 1 tablet (81 mg total) by mouth daily. 30 tablet 0   b complex vitamins tablet Take 1 tablet by mouth daily. 100 tablet 3   benzonatate (TESSALON) 200 MG capsule Take 1 capsule (200 mg total) by mouth 3 (three) times daily as needed for cough. 30 capsule 5   butalbital-acetaminophen-caffeine (FIORICET WITH CODEINE) 50-325-40-30 MG capsule Take 1 capsule by mouth every 6 (six) hours as needed for headache. 20 capsule 0   Cholecalciferol (VITAMIN D3) 50 MCG (2000 UT) capsule Take 1 capsule (2,000 Units total) by mouth daily. 100 capsule 3   doxazosin (CARDURA) 8 MG tablet Take 0.5 tablets (4 mg total) by mouth 2 (two) times daily. Overdue for Annual appt must see provider for future refills 90 tablet 3   Evolocumab (REPATHA SURECLICK) 300 MG/ML SOAJ Inject 1 mL into the skin every 14 (fourteen) days. 6 mL 1   finasteride (PROSCAR) 5 MG tablet Take 1 tablet by mouth once daily 90 tablet 1   metoprolol succinate (TOPROL XL) 25 MG 24 hr tablet Take 1/2 tablet ( 12.5 mg ) daily 45 tablet 3   pantoprazole (PROTONIX) 40 MG tablet Take 1 tablet (40 mg total) by mouth daily. 90 tablet 3   No facility-administered medications prior to visit.    ROS: Review of Systems  Constitutional:  Positive for fatigue and unexpected weight change. Negative for appetite change.  HENT:  Negative for congestion, nosebleeds, sneezing, sore throat and trouble swallowing.   Eyes:  Negative for itching and visual disturbance.  Respiratory:  Negative for cough.   Cardiovascular:  Negative for chest pain, palpitations and leg  swelling.  Gastrointestinal:  Negative for abdominal distention, blood in stool, diarrhea and nausea.  Genitourinary:  Positive for frequency. Negative for hematuria.  Musculoskeletal:  Negative for back pain, gait problem, joint swelling and neck pain.  Skin:  Negative for rash.  Neurological:  Negative for dizziness, tremors, speech difficulty and weakness.  Psychiatric/Behavioral:  Negative for agitation, dysphoric mood, sleep disturbance and suicidal ideas. The patient is not nervous/anxious.    Objective:  BP (!) 102/58 (BP Location: Left Arm)    Pulse 75    Temp 98.1 F (36.7 C) (Oral)    Ht 5' 9" (1.753 m)    Wt 193 lb 12.8 oz (87.9 kg)    SpO2 96%    BMI 28.62 kg/m   BP Readings from Last 3 Encounters:  07/21/21 (!) 102/58  10/10/20 120/74  09/29/20 120/72    Wt Readings from Last 3 Encounters:  07/21/21 193 lb 12.8 oz (87.9 kg)  10/10/20 209 lb (94.8 kg)  09/29/20 205 lb (93 kg)    Physical Exam Constitutional:      General: He is not in acute distress.    Appearance: He is well-developed. He is obese.     Comments: NAD  Eyes:     Conjunctiva/sclera: Conjunctivae normal.     Pupils: Pupils are equal, round, and reactive to light.  Neck:     Thyroid: No thyromegaly.  Vascular: No JVD.  Cardiovascular:     Rate and Rhythm: Normal rate and regular rhythm.     Heart sounds: Normal heart sounds. No murmur heard.   No friction rub. No gallop.  Pulmonary:     Effort: Pulmonary effort is normal. No respiratory distress.     Breath sounds: Normal breath sounds. No wheezing or rales.  Chest:     Chest wall: No tenderness.  Abdominal:     General: Bowel sounds are normal. There is no distension.     Palpations: Abdomen is soft. There is no mass.     Tenderness: There is no abdominal tenderness. There is no guarding or rebound.  Musculoskeletal:        General: No tenderness. Normal range of motion.     Cervical back: Normal range of motion.  Lymphadenopathy:      Cervical: No cervical adenopathy.  Skin:    General: Skin is warm and dry.     Findings: No rash.  Neurological:     Mental Status: He is alert and oriented to person, place, and time.     Cranial Nerves: No cranial nerve deficit.     Motor: No abnormal muscle tone.     Coordination: Coordination normal.     Gait: Gait normal.     Deep Tendon Reflexes: Reflexes are normal and symmetric.  Psychiatric:        Behavior: Behavior normal.        Thought Content: Thought content normal.        Judgment: Judgment normal.     A total time of 45 minutes was spent preparing to see the patient, reviewing tests, x-rays, operative reports and other medical records.  Also, obtaining history and performing comprehensive physical exam.  Additionally, counseling the patient regarding the above listed issues.   Finally, documenting clinical information in the health records, coordination of care, educating the patient regarding new onset diabetes, diet, glucose checks, exercise etc.    Lab Results  Component Value Date   WBC 5.5 07/14/2021   HGB 14.3 07/14/2021   HCT 41.6 07/14/2021   PLT 205.0 07/14/2021   GLUCOSE 320 (H) 07/21/2021   CHOL 201 (H) 07/14/2021   TRIG 181.0 (H) 07/14/2021   HDL 55.10 07/14/2021   LDLDIRECT 110.0 04/15/2017   LDLCALC 110 (H) 07/14/2021   ALT 50 07/21/2021   AST 34 07/21/2021   NA 132 (L) 07/21/2021   K 4.4 07/21/2021   CL 100 07/21/2021   CREATININE 1.15 07/21/2021   BUN 15 07/21/2021   CO2 25 07/21/2021   TSH 3.01 07/14/2021   PSA 0.05 (L) 07/14/2021   INR 1.0 03/10/2016   HGBA1C 12.8 (H) 07/21/2021    Myocardial Perfusion Imaging  Result Date: 08/26/2020  Nuclear stress EF: 53%.  The left ventricular ejection fraction is mildly decreased (45-54%).  There was no ST segment deviation noted during stress.  Defect 1: There is a small defect of mild severity present in the basal anterior and mid anterior location.  Defect 2: There is a large defect of  severe severity present in the basal inferior and mid inferior location.  Findings consistent with ischemia.  This is a low risk study.  Abnormal, low risk stress nuclear study with prominent diaphragmatic attenuation/inferior thinning and very mild ischemia in the mid anterior wall.  Gated ejection fraction low normal at 53%.    Assessment & Plan:   Problem List Items Addressed This Visit     CAD  S/P percutaneous coronary angioplasty    No angina Cont w/Toprol, Amlodipine      Diabetes mellitus due to underlying condition, uncontrolled, with hyperglycemia ([place]) - Primary    New onset A1c today Start Prandin, Metformin Diet discussed      Relevant Medications   metFORMIN (GLUCOPHAGE) 500 MG tablet   repaglinide (PRANDIN) 1 MG tablet   Other Relevant Orders   Comprehensive metabolic panel (Completed)   Hemoglobin A1c (Completed)   Dyslipidemia    Has not started Repatha - Start Repatha Goal LDL<70      Grief    Mom died in 10/12/2020         Meds ordered this encounter  Medications   Blood Glucose Monitoring Suppl (ONETOUCH VERIO) w/Device KIT    Sig: 1 Units by Does not apply route daily as needed.    Dispense:  1 kit    Refill:  1   glucose blood (ONETOUCH VERIO) test strip    Sig: Use as instructed    Dispense:  50 each    Refill:  11   Lancets (ONETOUCH ULTRASOFT) lancets    Sig: Use as instructed    Dispense:  100 each    Refill:  12   metFORMIN (GLUCOPHAGE) 500 MG tablet    Sig: Take 1 tablet (500 mg total) by mouth daily with breakfast.    Dispense:  30 tablet    Refill:  5   repaglinide (PRANDIN) 1 MG tablet    Sig: Take 1 tablet (1 mg total) by mouth 3 (three) times daily before meals.    Dispense:  90 tablet    Refill:  5      Follow-up: Return in about 2 weeks (around 08/04/2021) for a follow-up visit.  Walker Kehr, MD

## 2021-07-21 NOTE — Assessment & Plan Note (Signed)
No angina Cont w/Toprol, Amlodipine

## 2021-07-21 NOTE — Assessment & Plan Note (Signed)
Mom died in 09-30-20

## 2021-07-23 ENCOUNTER — Telehealth: Payer: Self-pay | Admitting: Cardiovascular Disease

## 2021-07-23 ENCOUNTER — Encounter: Payer: Self-pay | Admitting: Cardiovascular Disease

## 2021-07-23 NOTE — Telephone Encounter (Signed)
New Message:      Please call, concerning his [place]. He said one of the injection was broken.

## 2021-07-23 NOTE — Telephone Encounter (Signed)
Please advise pt he can call [employer] product replacement line at [phone] to request replacement Repatha pen.

## 2021-07-23 NOTE — Telephone Encounter (Signed)
I replied to the [place] message with, "You can call [employer] product replacement line at [phone] to request replacement Repatha pen please let us know if you have any questions."

## 2021-07-23 NOTE — Telephone Encounter (Signed)
Disregard - pt also sent in a MyChart message about this and looks like he has already requested replacement pen.

## 2021-07-31 ENCOUNTER — Other Ambulatory Visit: Payer: Self-pay | Admitting: Student

## 2021-08-04 ENCOUNTER — Ambulatory Visit (INDEPENDENT_AMBULATORY_CARE_PROVIDER_SITE_OTHER): Payer: 59 | Admitting: Internal Medicine

## 2021-08-04 ENCOUNTER — Other Ambulatory Visit: Payer: Self-pay

## 2021-08-04 ENCOUNTER — Encounter: Payer: Self-pay | Admitting: Internal Medicine

## 2021-08-04 VITALS — BP 90/60 | HR 72 | Temp 98.0°F | Ht 69.0 in | Wt 196.0 lb

## 2021-08-04 DIAGNOSIS — E0865 Diabetes mellitus due to underlying condition with hyperglycemia: Secondary | ICD-10-CM | POA: Diagnosis not present

## 2021-08-04 DIAGNOSIS — Z9861 Coronary angioplasty status: Secondary | ICD-10-CM

## 2021-08-04 DIAGNOSIS — E1165 Type 2 diabetes mellitus with hyperglycemia: Secondary | ICD-10-CM | POA: Diagnosis not present

## 2021-08-04 DIAGNOSIS — R682 Dry mouth, unspecified: Secondary | ICD-10-CM | POA: Diagnosis not present

## 2021-08-04 DIAGNOSIS — E538 Deficiency of other specified B group vitamins: Secondary | ICD-10-CM | POA: Diagnosis not present

## 2021-08-04 DIAGNOSIS — I10 Essential (primary) hypertension: Secondary | ICD-10-CM | POA: Diagnosis not present

## 2021-08-04 DIAGNOSIS — I2510 Atherosclerotic heart disease of native coronary artery without angina pectoris: Secondary | ICD-10-CM

## 2021-08-04 NOTE — Assessment & Plan Note (Signed)
Better  

## 2021-08-04 NOTE — Assessment & Plan Note (Signed)
Monitor BP 

## 2021-08-04 NOTE — Assessment & Plan Note (Signed)
On B12 

## 2021-08-04 NOTE — Assessment & Plan Note (Signed)
Cont w/Prandin, Metformin Diet discussed

## 2021-08-04 NOTE — Assessment & Plan Note (Signed)
On Repatha 

## 2021-08-04 NOTE — Progress Notes (Signed)
Subjective:  Patient ID: [name], male    DOB: 1956-03-07  Age: 66 y.o. MRN: [id number]  CC: No chief complaint on file.   HPI DETRAVION TESTER presents for DM, CAD C/o blurred vision  Outpatient Medications Prior to Visit  Medication Sig Dispense Refill   amLODipine (NORVASC) 5 MG tablet Take 1 tablet by mouth once daily 90 tablet 3   aspirin EC 81 MG tablet Take 1 tablet (81 mg total) by mouth daily. 30 tablet 0   b complex vitamins tablet Take 1 tablet by mouth daily. 100 tablet 3   benzonatate (TESSALON) 200 MG capsule Take 1 capsule (200 mg total) by mouth 3 (three) times daily as needed for cough. 30 capsule 5   Blood Glucose Monitoring Suppl (ONETOUCH VERIO) w/Device KIT 1 Units by Does not apply route daily as needed. 1 kit 1   butalbital-acetaminophen-caffeine (FIORICET WITH CODEINE) 50-325-40-30 MG capsule Take 1 capsule by mouth every 6 (six) hours as needed for headache. 20 capsule 0   Cholecalciferol (VITAMIN D3) 50 MCG (2000 UT) capsule Take 1 capsule (2,000 Units total) by mouth daily. 100 capsule 3   doxazosin (CARDURA) 8 MG tablet Take 0.5 tablets (4 mg total) by mouth 2 (two) times daily. Overdue for Annual appt must see provider for future refills 90 tablet 3   Evolocumab (REPATHA SURECLICK) 528 MG/ML SOAJ Inject 1 mL into the skin every 14 (fourteen) days. 6 mL 1   finasteride (PROSCAR) 5 MG tablet Take 1 tablet by mouth once daily 90 tablet 1   glucose blood (ONETOUCH VERIO) test strip Use as instructed 50 each 11   Lancets (ONETOUCH ULTRASOFT) lancets Use as instructed 100 each 12   metFORMIN (GLUCOPHAGE) 500 MG tablet Take 1 tablet (500 mg total) by mouth daily with breakfast. 30 tablet 5   metoprolol succinate (TOPROL-XL) 25 MG 24 hr tablet Take 1/2 (one-half) tablet by mouth once daily 45 tablet 0   pantoprazole (PROTONIX) 40 MG tablet Take 1 tablet (40 mg total) by mouth daily. 90 tablet 3   repaglinide (PRANDIN) 1 MG tablet Take 1 tablet (1  mg total) by mouth 3 (three) times daily before meals. 90 tablet 5   No facility-administered medications prior to visit.    ROS: Review of Systems  Constitutional:  Negative for appetite change, fatigue and unexpected weight change.  HENT:  Negative for congestion, nosebleeds, sneezing, sore throat and trouble swallowing.   Eyes:  Negative for itching and visual disturbance.  Respiratory:  Negative for cough.   Cardiovascular:  Negative for chest pain, palpitations and leg swelling.  Gastrointestinal:  Negative for abdominal distention, blood in stool, diarrhea and nausea.  Genitourinary:  Negative for frequency and hematuria.  Musculoskeletal:  Negative for back pain, gait problem, joint swelling and neck pain.  Skin:  Negative for rash.  Neurological:  Negative for dizziness, tremors, speech difficulty and weakness.  Psychiatric/Behavioral:  Negative for agitation, dysphoric mood and sleep disturbance. The patient is not nervous/anxious.    Objective:  BP 90/60 (BP Location: Left Arm, Patient Position: Sitting, Cuff Size: Large)    Pulse 72    Temp 98 F (36.7 C) (Oral)    Ht _0  (1.753 m)    Wt 196 lb (88.9 kg)    SpO2 95%    BMI 28.94 kg/m   BP Readings from Last 3 Encounters:  08/04/21 90/60  07/21/21 (!) 102/58  10/10/20 120/74    Wt Readings from Last 3  Encounters:  08/04/21 196 lb (88.9 kg)  07/21/21 193 lb 12.8 oz (87.9 kg)  10/10/20 209 lb (94.8 kg)    Physical Exam Constitutional:      General: He is not in acute distress.    Appearance: He is well-developed.     Comments: NAD  Eyes:     Conjunctiva/sclera: Conjunctivae normal.     Pupils: Pupils are equal, round, and reactive to light.  Neck:     Thyroid: No thyromegaly.     Vascular: No JVD.  Cardiovascular:     Rate and Rhythm: Normal rate and regular rhythm.     Heart sounds: Normal heart sounds. No murmur heard.   No friction rub. No gallop.  Pulmonary:     Effort: Pulmonary effort is normal. No  respiratory distress.     Breath sounds: Normal breath sounds. No wheezing or rales.  Chest:     Chest wall: No tenderness.  Abdominal:     General: Bowel sounds are normal. There is no distension.     Palpations: Abdomen is soft. There is no mass.     Tenderness: There is no abdominal tenderness. There is no guarding or rebound.  Musculoskeletal:        General: No tenderness. Normal range of motion.     Cervical back: Normal range of motion.  Lymphadenopathy:     Cervical: No cervical adenopathy.  Skin:    General: Skin is warm and dry.     Findings: No rash.  Neurological:     Mental Status: He is alert and oriented to person, place, and time.     Cranial Nerves: No cranial nerve deficit.     Motor: No abnormal muscle tone.     Coordination: Coordination normal.     Gait: Gait normal.     Deep Tendon Reflexes: Reflexes are normal and symmetric.  Psychiatric:        Behavior: Behavior normal.        Thought Content: Thought content normal.        Judgment: Judgment normal.    Lab Results  Component Value Date   WBC 5.5 07/14/2021   HGB 14.3 07/14/2021   HCT 41.6 07/14/2021   PLT 205.0 07/14/2021   GLUCOSE 320 (H) 07/21/2021   CHOL 201 (H) 07/14/2021   TRIG 181.0 (H) 07/14/2021   HDL 55.10 07/14/2021   LDLDIRECT 110.0 04/15/2017   LDLCALC 110 (H) 07/14/2021   ALT 50 07/21/2021   AST 34 07/21/2021   NA 132 (L) 07/21/2021   K 4.4 07/21/2021   CL 100 07/21/2021   CREATININE 1.15 07/21/2021   BUN 15 07/21/2021   CO2 25 07/21/2021   TSH 3.01 07/14/2021   PSA 0.05 (L) 07/14/2021   INR 1.0 03/10/2016   HGBA1C 12.8 (H) 07/21/2021    Myocardial Perfusion Imaging  Result Date: 08/26/2020  Nuclear stress EF: 53%.  The left ventricular ejection fraction is mildly decreased (45-54%).  There was no ST segment deviation noted during stress.  Defect 1: There is a small defect of mild severity present in the basal anterior and mid anterior location.  Defect 2: There is a  large defect of severe severity present in the basal inferior and mid inferior location.  Findings consistent with ischemia.  This is a low risk study.  Abnormal, low risk stress nuclear study with prominent diaphragmatic attenuation/inferior thinning and very mild ischemia in the mid anterior wall.  Gated ejection fraction low normal at 53%.  Assessment & Plan:   Problem List Items Addressed This Visit     B12 deficiency    On B12      CAD S/P percutaneous coronary angioplasty    On Repatha      Diabetes mellitus due to underlying condition, uncontrolled, with hyperglycemia ([place]) - Primary    Cont w/Prandin, Metformin Diet discussed      Relevant Orders   Basic metabolic panel   Dry mouth    Better      Essential hypertension    Monitor BP         No orders of the defined types were placed in this encounter.     Follow-up: No follow-ups on file.  Walker Kehr, MD

## 2021-08-05 ENCOUNTER — Other Ambulatory Visit (INDEPENDENT_AMBULATORY_CARE_PROVIDER_SITE_OTHER): Payer: 59

## 2021-08-05 DIAGNOSIS — E0865 Diabetes mellitus due to underlying condition with hyperglycemia: Secondary | ICD-10-CM

## 2021-08-06 LAB — BASIC METABOLIC PANEL
BUN: 18 mg/dL (ref 6–23)
CO2: 26 mEq/L (ref 19–32)
Calcium: 9 mg/dL (ref 8.4–10.5)
Chloride: 104 mEq/L (ref 96–112)
Creatinine, Ser: 1.11 mg/dL (ref 0.40–1.50)
GFR: 70.6 mL/min (ref >60.00)
Glucose, Bld: 167 mg/dL — ABNORMAL HIGH (ref 70–99)
Potassium: 4.4 mEq/L (ref 3.5–5.1)
Sodium: 137 mEq/L (ref 135–145)

## 2021-08-18 ENCOUNTER — Encounter: Payer: Self-pay | Admitting: Internal Medicine

## 2021-08-18 ENCOUNTER — Encounter: Payer: Self-pay | Admitting: Cardiovascular Disease

## 2021-10-02 ENCOUNTER — Other Ambulatory Visit: Payer: Self-pay | Admitting: Internal Medicine

## 2021-10-02 ENCOUNTER — Encounter: Payer: Self-pay | Admitting: Cardiovascular Disease

## 2021-10-05 ENCOUNTER — Other Ambulatory Visit: Payer: Self-pay | Admitting: Internal Medicine

## 2021-10-05 ENCOUNTER — Encounter: Payer: Self-pay | Admitting: Internal Medicine

## 2021-10-30 ENCOUNTER — Other Ambulatory Visit: Payer: Self-pay

## 2021-10-30 MED ORDER — METOPROLOL SUCCINATE ER 25 MG PO TB24: ORAL_TABLET | ORAL | 0 refills | Status: DC

## 2021-11-07 ENCOUNTER — Encounter: Payer: Self-pay | Admitting: Cardiovascular Disease

## 2021-11-09 ENCOUNTER — Encounter: Payer: Self-pay | Admitting: Internal Medicine

## 2021-11-09 MED ORDER — AMLODIPINE BESYLATE 5 MG PO TABS: 5.0000 mg | ORAL_TABLET | Freq: Every day | ORAL | 0 refills | Status: DC

## 2021-11-17 ENCOUNTER — Ambulatory Visit: Payer: 59 | Admitting: Cardiovascular Disease

## 2021-12-04 ENCOUNTER — Other Ambulatory Visit: Payer: Self-pay | Admitting: Cardiovascular Disease

## 2021-12-06 ENCOUNTER — Other Ambulatory Visit: Payer: Self-pay | Admitting: Cardiovascular Disease

## 2021-12-08 ENCOUNTER — Ambulatory Visit: Payer: 59 | Admitting: Cardiovascular Disease

## 2021-12-08 ENCOUNTER — Encounter: Payer: Self-pay | Admitting: Cardiovascular Disease

## 2021-12-08 VITALS — BP 118/68 | HR 72 | Ht 69.0 in | Wt 208.8 lb

## 2021-12-08 DIAGNOSIS — I2510 Atherosclerotic heart disease of native coronary artery without angina pectoris: Secondary | ICD-10-CM | POA: Diagnosis not present

## 2021-12-08 DIAGNOSIS — I10 Essential (primary) hypertension: Secondary | ICD-10-CM | POA: Diagnosis not present

## 2021-12-08 DIAGNOSIS — Z9861 Coronary angioplasty status: Secondary | ICD-10-CM

## 2021-12-08 DIAGNOSIS — E785 Hyperlipidemia, unspecified: Secondary | ICD-10-CM

## 2021-12-08 MED ORDER — AMLODIPINE BESYLATE 5 MG PO TABS: 5.0000 mg | ORAL_TABLET | Freq: Every day | ORAL | 3 refills | Status: DC

## 2021-12-08 NOTE — Assessment & Plan Note (Signed)
History of essential hypertension blood pressure measured today at [facility].  He is on amlodipine and metoprolol.

## 2021-12-08 NOTE — Addendum Note (Signed)
Addended by: Beatrix Fetters on: 12/08/2021 09:58 AM   Modules accepted: Orders

## 2021-12-08 NOTE — Patient Instructions (Signed)
Medication Instructions:  Your physician recommends that you continue on your current medications as directed. Please refer to the Current Medication list given to you today.  *If you need a refill on your cardiac medications before your next appointment, please call your pharmacy*   Lab Work: Your physician recommends that you return for lab work in: next week or 2 for FASTING lipid/liver profile.  If you have labs (blood work) drawn today and your tests are completely normal, you will receive your results only by: [place] (if you have MyChart) OR A paper copy in the mail If you have any lab test that is abnormal or we need to change your treatment, we will call you to review the results.   Follow-Up: At [facility], you and your health needs are our priority.  As part of our continuing mission to provide you with exceptional heart care, we have created designated Provider Care Teams.  These Care Teams include your primary Cardiologist (physician) and Advanced Practice Providers (APPs -  Physician Assistants and Nurse Practitioners) who all work together to provide you with the care you need, when you need it.  We recommend signing up for the patient portal called "MyChart".  Sign up information is provided on this After Visit Summary.  MyChart is used to connect with patients for Virtual Visits (Telemedicine).  Patients are able to view lab/test results, encounter notes, upcoming appointments, etc.  Non-urgent messages can be sent to your provider as well.   To learn more about what you can do with MyChart, go to [email or web address].    Your next appointment:   12 month(s)  The format for your next appointment:   In Person  Provider:   Quay Burow, MD

## 2021-12-08 NOTE — Assessment & Plan Note (Signed)
History of hyperlipidemia intolerant to statin therapy on Repatha.  We will recheck a fasting lipid liver profile.

## 2021-12-08 NOTE — Progress Notes (Signed)
12/08/2021 [name]   04-17-1956  [id number]  Primary Physician Plotnikov, Evie Lacks, MD Primary Cardiologist: Jon Harp MD [name], [place]  HPI:  [name] is a 66 y.o.  single [place] male whose mother is a patient of mine. I last saw him in the office 10/10/2020. He has a history of treated hypertension, hyperlipidemia and family history for heart disease. He is complained of chest pain off and on for several years. Somewhat exertional. He did have a stress test done in January that was low risk. He's had a persistent cough over the last 6 months. Recent chest CT performed 01/08/16 showed no significant pulmonary abnormalities but there was a small pericardial effusion and calcium in the LAD territory. He underwent outpatient cardiac catheterization on 02/05/16 B right radial approach revealing a normal left system with moderate hypodense mid dominant RCA stenosis. He developed ventricular fibrillation after the third coronary injection requiring CPR and defibrillation. Because of ongoing chest pain I performed PCI and drug-eluting stenting of his mid RCA via the right femoral approach 03/10/16. The procedure was uncomplicated. He continues to have atypical chest pain. His PCP discontinued the statin drug for presumed "statin intolerance".     Unfortunately, he lost his mother who is also a patient of mine.  He had a Myoview showed diaphragmatic attenuation without ischemia performed 08/26/2020 and an event monitor showed no arrhythmias.  Since I saw him a year ago he is remained relatively stable.  He says he gets some chest tightness when he walks around a park after the first lap but after that has no symptoms.   Current Meds  Medication Sig   amLODipine (NORVASC) 5 MG tablet Take 1 tablet by mouth once daily   aspirin EC 81 MG tablet Take 1 tablet (81 mg total) by mouth daily.   b complex vitamins tablet Take 1 tablet by mouth daily.   benzonatate  (TESSALON) 200 MG capsule Take 1 capsule (200 mg total) by mouth 3 (three) times daily as needed for cough.   Blood Glucose Monitoring Suppl (ONETOUCH VERIO) w/Device KIT 1 Units by Does not apply route daily as needed.   butalbital-acetaminophen-caffeine (FIORICET WITH CODEINE) 50-325-40-30 MG capsule Take 1 capsule by mouth every 6 (six) hours as needed for headache.   Cholecalciferol (VITAMIN D3) 50 MCG (2000 UT) capsule Take 1 capsule (2,000 Units total) by mouth daily.   doxazosin (CARDURA) 8 MG tablet Take 0.5 tablets (4 mg total) by mouth 2 (two) times daily.   Evolocumab (REPATHA SURECLICK) 782 MG/ML SOAJ Inject 1 mL into the skin every 14 (fourteen) days.   finasteride (PROSCAR) 5 MG tablet Take 1 tablet by mouth once daily   glucose blood (ONETOUCH VERIO) test strip Use as instructed   Lancets (ONETOUCH ULTRASOFT) lancets Use as instructed   metFORMIN (GLUCOPHAGE) 500 MG tablet Take 1 tablet (500 mg total) by mouth daily with breakfast.   metoprolol succinate (TOPROL-XL) 25 MG 24 hr tablet Take 1/2 (one-half) tablet by mouth once daily   pantoprazole (PROTONIX) 40 MG tablet Take 1 tablet (40 mg total) by mouth daily.   repaglinide (PRANDIN) 1 MG tablet Take 1 tablet (1 mg total) by mouth 3 (three) times daily before meals.     Allergies  Allergen Reactions   Flomax [Tamsulosin Hcl] Other (See Comments)    Hard time breathing   Atorvastatin Other (See Comments)    REACTION: leg cramps per patient with a large dose  Social History   Socioeconomic History   Marital status: Divorced    Spouse name: Not on file   Number of children: 1   Years of education: Not on file   Highest education level: Not on file  Occupational History   Occupation: Animal nutritionist  Tobacco Use   Smoking status: Never   Smokeless tobacco: Never  Vaping Use   Vaping Use: Never used  Substance and Sexual Activity   Alcohol use: Not Currently   Drug use: No   Sexual activity: Never    Birth  control/protection: Abstinence  Other Topics Concern   Not on file  Social History Narrative   Coffee daily    Social Determinants of Health   Financial Resource Strain: Not on file  Food Insecurity: Not on file  Transportation Needs: Not on file  Physical Activity: Not on file  Stress: Not on file  Social Connections: Not on file  Intimate Partner Violence: Not on file     Review of Systems: General: negative for chills, fever, night sweats or weight changes.  Cardiovascular: negative for chest pain, dyspnea on exertion, edema, orthopnea, palpitations, paroxysmal nocturnal dyspnea or shortness of breath Dermatological: negative for rash Respiratory: negative for cough or wheezing Urologic: negative for hematuria Abdominal: negative for nausea, vomiting, diarrhea, bright red blood per rectum, melena, or hematemesis Neurologic: negative for visual changes, syncope, or dizziness All other systems reviewed and are otherwise negative except as noted above.    Blood pressure 118/68, pulse 72, height $RemoveBe'5\' 9"'lemmsowxa$  (1.753 m), weight 208 lb 12.8 oz (94.7 kg), SpO2 94 %.  General appearance: alert and no distress Neck: no adenopathy, no carotid bruit, no JVD, supple, symmetrical, trachea midline, and thyroid not enlarged, symmetric, no tenderness/mass/nodules Lungs: clear to auscultation bilaterally Heart: regular rate and rhythm, S1, S2 normal, no murmur, click, rub or gallop Extremities: extremities normal, atraumatic, no cyanosis or edema Pulses: 2+ and symmetric Skin: Skin color, texture, turgor normal. No rashes or lesions Neurologic: Grossly normal  EKG sinus rhythm at 72 with nonspecific ST and T wave changes.  Personally reviewed this EKG.  ASSESSMENT AND PLAN:   Dyslipidemia History of hyperlipidemia intolerant to statin therapy on Repatha.  We will recheck a fasting lipid liver profile.  Essential hypertension History of essential hypertension blood pressure measured today at  [place].  He is on amlodipine and metoprolol.  CAD S/P percutaneous coronary angioplasty History of CAD status post cardiac catheterization performed by myself 02/05/2016 revealing a high-grade hypodense mid RCA stenosis.  His left system was essentially normal.  He developed V-fib after the first RCA injection and was resuscitated after brief CPR.  I did bring him back 03/10/2016 and stented his RCA via the right femoral approach.  He said no recurrent symptoms.     Jon Harp MD FACP,FACC,FAHA, [facility] 12/08/2021 9:49 AM

## 2021-12-08 NOTE — Assessment & Plan Note (Signed)
History of CAD status post cardiac catheterization performed by myself 02/05/2016 revealing a high-grade hypodense mid RCA stenosis.  His left system was essentially normal.  He developed V-fib after the first RCA injection and was resuscitated after brief CPR.  I did bring him back 03/10/2016 and stented his RCA via the right femoral approach.  He said no recurrent symptoms.

## 2021-12-10 LAB — LIPID PANEL
Chol/HDL Ratio: 2 ratio (ref 0.0–5.0)
Cholesterol, Total: 118 mg/dL (ref 100–199)
HDL: 59 mg/dL (ref >39)
LDL Chol Calc (NIH): 39 mg/dL (ref 0–99)
Triglycerides: 109 mg/dL (ref 0–149)
VLDL Cholesterol Cal: 20 mg/dL (ref 5–40)

## 2021-12-10 LAB — HEPATIC FUNCTION PANEL
ALT: 29 IU/L (ref 0–44)
AST: 21 IU/L (ref 0–40)
Albumin: 4.4 g/dL (ref 3.8–4.8)
Alkaline Phosphatase: 57 IU/L (ref 44–121)
Bilirubin Total: 0.6 mg/dL (ref 0.0–1.2)
Bilirubin, Direct: 0.18 mg/dL (ref 0.00–0.40)
Total Protein: 6.8 g/dL (ref 6.0–8.5)

## 2021-12-11 ENCOUNTER — Ambulatory Visit: Payer: 59 | Admitting: Cardiovascular Disease

## 2021-12-24 ENCOUNTER — Other Ambulatory Visit: Payer: Self-pay | Admitting: Cardiovascular Disease

## 2021-12-24 DIAGNOSIS — I2510 Atherosclerotic heart disease of native coronary artery without angina pectoris: Secondary | ICD-10-CM

## 2021-12-24 DIAGNOSIS — E785 Hyperlipidemia, unspecified: Secondary | ICD-10-CM

## 2022-01-11 ENCOUNTER — Other Ambulatory Visit: Payer: Self-pay | Admitting: Cardiovascular Disease

## 2022-01-11 ENCOUNTER — Other Ambulatory Visit: Payer: Self-pay | Admitting: Internal Medicine

## 2022-01-11 MED ORDER — METFORMIN HCL 500 MG PO TABS: 500.0000 mg | ORAL_TABLET | Freq: Every day | ORAL | 0 refills | Status: DC

## 2022-03-08 ENCOUNTER — Encounter: Payer: Self-pay | Admitting: Internal Medicine

## 2022-03-10 ENCOUNTER — Other Ambulatory Visit: Payer: Self-pay | Admitting: Internal Medicine

## 2022-03-11 MED ORDER — REPAGLINIDE 1 MG PO TABS: ORAL_TABLET | ORAL | 1 refills | Status: DC

## 2022-03-11 NOTE — Telephone Encounter (Signed)
Pt has new [employer] INS which has been attached to his chart. Please resubmit his repaglinide (PRANDIN) 1 MG tablet  RX with his new INS info.   [employer] ID # [id number]

## 2022-03-11 NOTE — Addendum Note (Signed)
Addended by: Earnstine Regal on: 03/11/2022 03:11 PM   Modules accepted: Orders

## 2022-03-15 ENCOUNTER — Encounter: Payer: Self-pay | Admitting: Internal Medicine

## 2022-03-16 ENCOUNTER — Telehealth: Payer: Self-pay | Admitting: Pharmacist

## 2022-03-16 NOTE — Telephone Encounter (Signed)
PA renewal for Repatha submitted Key: [id number]

## 2022-04-01 ENCOUNTER — Other Ambulatory Visit: Payer: Self-pay | Admitting: Internal Medicine

## 2022-04-01 ENCOUNTER — Encounter: Payer: Self-pay | Admitting: Internal Medicine

## 2022-04-01 ENCOUNTER — Encounter: Payer: Self-pay | Admitting: Cardiovascular Disease

## 2022-04-01 MED ORDER — FINASTERIDE 5 MG PO TABS: 5.0000 mg | ORAL_TABLET | Freq: Every day | ORAL | 1 refills | Status: DC

## 2022-04-05 NOTE — Progress Notes (Signed)
Cardiology Office Note:    Date:  04/06/2022   ID:  [name], DOB 1955-11-03, MRN [id number]  PCP:  Cassandria Anger, MD   [place] Providers Cardiologist:  Quay Burow, MD     Referring MD: Cassandria Anger, MD   CC: Chest pain with exercise x 6 months  History of Present Illness:    [name] is a 66 y.o. male with a hx of the following:  PAF HTN CAD, s/p percutaneous coronary angioplasty, PCI and DES to [facility] in [id number] (Complication: VF during procedure) Dyslipidemia Type 2 diabetes GERD Asthma Vertigo  Cardiovascular history includes history of hyperlipidemia, hypertension and family history of heart disease.  Has complaint of chest pain intermittently for years, but seems to be somewhat exertional.  Stress test done in January was low risk.  At last visit with Dr. Gwenlyn Found in June 2023, he had noted a persistent cough over the last 6 months.  Previous CT of chest in 2017 did not reveal pulmonary abnormalities but did note a small pericardial effusion and calcium in the LAD territory.  Outpatient cardiac cath in 2017 revealed normal left system with moderate hypodense, mid dominant RCA stenosis.  Developed V-fib after the third coronary injection requiring CPR and defib.  Continue to have ongoing chest pain, and in September 2017 Dr. Gwenlyn Found performed PCI with DES to mid RCA via right femoral approach.   Last seen by Dr. Gwenlyn Found on December 08, 2021.  Did note atypical chest pain/chest tightness when walking around in the park after the first lap, but after that he did not notice any symptoms. It was previously felt that he was having statin intolerance, therefore PCP discontinued statin.  Myoview performed in 2022 showed diaphragmatic attenuation without ischemia.  Event monitor did not reveal any arrhythmias.  Patient recently contacted our office on 04/01/22 requesting to "do ultrasound of aorta and heart arteries and carotid arteries of  neck." Stated he has a chest discomfort soon after starting his walk for the past 6 months that would go away by the end of his walk. Today he presents for follow-up with his Interpreter for evaluation. He states this is chest pain occurs on left side of his chest and does not radiate. States it is similar to when he had his cardiac catheterization done in 2017; however, he says he would prefer not to do a cardiac cath as he does not want to undergo cardiac arrest like last time. Interpreter said he also stopped taking his PPI recently, but wants to first make sure there is not anything going on with his heart. Overall he is doing well. BP is well controlled and denies any shortness of breath, palpitations, syncope, presyncope, dizziness, lightheadedness, nausea, vomiting, diarrhea, swelling, bleeding, or claudication. Denies any other questions or concerns.   Past Medical History:  Diagnosis Date   Allergy    Atrial fibrillation ([place])    CAD (coronary artery disease)    a.cath 01/2016: 60% stenosis RCA. Went into VF during procedure requiring CPR and defibrillation. No intervention performed at that time b.cath 02/2016: 3 mm x 15 mm Xience DES to prox-RCA    Coronary artery disease    GERD (gastroesophageal reflux disease)    Headache(784.0)    Hyperlipidemia    Hypertension    Kidney stones    "passed it" (03/18/2016)   URI (upper respiratory infection)     Past Surgical History:  Procedure Laterality Date   CARDIAC CATHETERIZATION N/A 02/05/2016  Procedure: Left Heart Cath and Coronary Angiography;  Surgeon: Lorretta Harp, MD;  Location: [place] [facility];  Service: Cardiovascular;  Laterality: N/A;   CARDIAC CATHETERIZATION N/A 03/18/2016   Procedure: Coronary Stent Intervention;  Surgeon: Lorretta Harp, MD;  Location: [place] [facility];  Service: Cardiovascular;  Laterality: N/A;  RCA   COLONOSCOPY     CORONARY ANGIOPLASTY     INGUINAL HERNIA REPAIR Bilateral    2005    TONSILLECTOMY      Current Medications: Current Meds  Medication Sig   amLODipine (NORVASC) 5 MG tablet Take 1 tablet (5 mg total) by mouth daily.   aspirin EC 81 MG tablet Take 1 tablet (81 mg total) by mouth daily.   b complex vitamins tablet Take 1 tablet by mouth daily.   Blood Glucose Monitoring Suppl (ONETOUCH VERIO) w/Device KIT 1 Units by Does not apply route daily as needed.   Cholecalciferol (VITAMIN D3) 50 MCG (2000 UT) capsule Take 1 capsule (2,000 Units total) by mouth daily.   doxazosin (CARDURA) 8 MG tablet Take 0.5 tablets (4 mg total) by mouth 2 (two) times daily.   Evolocumab (REPATHA SURECLICK) 992 MG/ML SOAJ INJECT 1 ML INTO THE SKIN EVERY 14 DAYS   finasteride (PROSCAR) 5 MG tablet Take 1 tablet (5 mg total) by mouth daily.   glucose blood (ONETOUCH VERIO) test strip Use as instructed   Lancets (ONETOUCH ULTRASOFT) lancets Use as instructed   metFORMIN (GLUCOPHAGE) 500 MG tablet Take 1 tablet (500 mg total) by mouth daily with breakfast. Overdue for follow- appt must see provider for future refills   metoprolol succinate (TOPROL-XL) 25 MG 24 hr tablet Take 1/2 (one-half) tablet by mouth once daily   repaglinide (PRANDIN) 1 MG tablet TAKE 1 TABLET BY MOUTH THREE TIMES DAILY BEFORE MEAL(S)     Allergies:   Flomax [tamsulosin hcl] and Atorvastatin   Social History   Socioeconomic History   Marital status: Divorced    Spouse name: Not on file   Number of children: 1   Years of education: Not on file   Highest education level: Not on file  Occupational History   Occupation: Animal nutritionist  Tobacco Use   Smoking status: Never   Smokeless tobacco: Never  Vaping Use   Vaping Use: Never used  Substance and Sexual Activity   Alcohol use: Not Currently   Drug use: No   Sexual activity: Never    Birth control/protection: Abstinence  Other Topics Concern   Not on file  Social History Narrative   Coffee daily    Social Determinants of Health   Financial  Resource Strain: Not on file  Food Insecurity: Not on file  Transportation Needs: Not on file  Physical Activity: Not on file  Stress: Not on file  Social Connections: Not on file     Family History: The patient's family history includes Coronary artery disease in his father and mother; Diabetes in his father; Heart attack in his maternal grandfather and mother; Heart disease in his father and mother; Hypertension in his mother; Kidney disease in his mother; Liver disease in his mother. There is no history of Colon cancer, Stroke, Esophageal cancer, Stomach cancer, or Rectal cancer.  ROS:   Review of Systems  Constitutional: Negative.   HENT: Negative.    Eyes: Negative.   Respiratory: Negative.    Cardiovascular:  Positive for chest pain. Negative for palpitations, orthopnea, claudication, leg swelling and PND.  See HPI.   Gastrointestinal: Negative.   Genitourinary: Negative.   Musculoskeletal: Negative.   Skin: Negative.   Neurological: Negative.   Endo/Heme/Allergies: Negative.   Psychiatric/Behavioral: Negative.      Please see the history of present illness.    All other systems reviewed and are negative.  EKGs/Labs/Other Studies Reviewed:    The following studies were reviewed today:   EKG:  EKG is ordered today.  The ekg ordered today demonstrates NSR, LVH, nospecific ST segment changes, otherwise nothing acute.   Cardiac telemetry monitor on September 06, 2020: Overall rhythm was normal sinus rhythm, no concerning abnormal heart rhythms or early beats to explain palpitations.  NST on 08/26/2020: Nuclear stress EF: 53%. The left ventricular ejection fraction is mildly decreased (45-54%). There was no ST segment deviation noted during stress. Defect 1: There is a small defect of mild severity present in the basal anterior and mid anterior location. Defect 2: There is a large defect of severe severity present in the basal inferior and mid inferior  location. Findings consistent with ischemia. This is a low risk study.   Abnormal, low risk stress nuclear study with prominent diaphragmatic attenuation/inferior thinning and very mild ischemia in the mid anterior wall.  Gated ejection fraction low normal at 53%.   Limited Echo on 04/20/2016: - Left ventricle: The cavity size was normal. Systolic function was    normal. The estimated ejection fraction was in the range of 60%    to 65%. Wall motion was normal; there were no regional wall    motion abnormalities. Left ventricular diastolic function    parameters were normal.  - Aortic valve: Trileaflet; normal thickness, mildly calcified    leaflets.  - Pulmonic valve: There was trivial regurgitation.  - Pulmonary arteries: Systolic pressure could not be accurately    estimated.   Coronary stent intervention on 03/18/2016: Successful RCA PCI and drug eluting stenting using a Xience drug-eluting stent in the proximal to mid dominant RCA.   Left heart cath on 02/05/2016: Mr. Tuckerman has mild narrowing of the distal left main disease does not appear to be significant and has a 60% hypodense proximal to mid dominant RCA stenosis. He did fibrillate after the third coronary injection requiring CPR and defibrillation. I was not damped nor was I engaged in the conus branch. Based on this, his symptoms I feel the RCA should be percutaneously addressed. I will keep him in to a stepdown overnight and discharged home in the morning. We will arrange for him to have elective RCA PCI and stenting in the next one to 2 weeks. He left the lab awake, alert and hemodynamic stable.   Echo complete on 01/30/2016: - Left ventricle: The cavity size was normal. There was mild focal    basal hypertrophy of the septum. Systolic function was normal.    The estimated ejection fraction was in the range of 60% to 65%.    Wall motion was normal; there were no regional wall motion    abnormalities. Left ventricular  diastolic function parameters    were normal.  - Aortic valve: Trileaflet; mildly thickened, mildly calcified    leaflets. There was mild regurgitation.   Nuclear stress test on July 15, 2015: Nuclear stress EF: 66%. There was no ST segment deviation noted during stress. The study is normal. The left ventricular ejection fraction is normal (55-65%).   1. No evidence for ischemia or infarction (count-poor rest study).  2. Normal LV systolic function and wall motion.  3. Normal study.   Recent Labs: 07/14/2021: Hemoglobin 14.3; Platelets 205.0; TSH 3.01 08/05/2021: BUN 18; Creatinine, Ser 1.11; Potassium 4.4; Sodium 137 12/10/2021: ALT 29  Recent Lipid Panel    Component Value Date/Time   CHOL 118 12/10/2021 0822   TRIG 109 12/10/2021 0822   HDL 59 12/10/2021 0822   CHOLHDL 2.0 12/10/2021 0822   CHOLHDL 4 07/14/2021 0811   VLDL 36.2 07/14/2021 0811   LDLCALC 39 12/10/2021 0822   LDLDIRECT 110.0 04/15/2017 1359     Risk Assessment/Calculations:      CHA2DS2-VASc Score = 2   This indicates a 2.2% annual risk of stroke. The patient's score is based upon: CHF History: 0 HTN History: 1 Diabetes History: 1 Stroke History: 0 Vascular Disease History: 0 Age Score: 0 Gender Score: 0   Physical Exam:    VS:  BP 112/76 (BP Location: Left Arm, Patient Position: Sitting, Cuff Size: Normal)   Pulse 75   Ht _0  (1.753 m)   Wt 211 lb 12.8 oz (96.1 kg)   BMI 31.28 kg/m     Wt Readings from Last 3 Encounters:  04/06/22 211 lb 12.8 oz (96.1 kg)  12/08/21 208 lb 12.8 oz (94.7 kg)  08/04/21 196 lb (88.9 kg)     GEN: Well nourished, well developed 66 y.o. male in no acute distress, does not appear ill-appearin HEENT: Normal NECK: No JVD; No carotid bruits CARDIAC: S1/S2, RRR, no murmurs, rubs, gallops; 2+ peripheral pulses throughout, strong and equal bilaterally.  RESPIRATORY:  Clear and diminished to auscultation without rales, wheezing or rhonchi  ABDOMEN: Soft,  non-tender, non-distended MUSCULOSKELETAL:  No edema; No deformity  SKIN: Warm and dry NEUROLOGIC:  Alert and oriented x 3 PSYCHIATRIC:  Normal affect   ASSESSMENT:    1. Coronary artery disease involving native heart with angina pectoris, unspecified vessel or lesion type ([place])   2. S/P drug eluting coronary stent placement   3. Dyslipidemia   4. PAF (paroxysmal atrial fibrillation) ([place])   5. Essential hypertension    PLAN:    In order of problems listed above:  CAD, s/p PCI and DES to [facility] in 2017 Chronic, intermittent CP that occurs during early exercise and goes away by end of exercise. Myoview in 2022 did not reveal any significant areas of ischemia. Medical therapy recommended. Discussed that due to his stent, we could repeat Myoview or medically manage. He is requesting medical management at this time. Will initiate Imdur 15 mg daily and start Nitroglycerin 0.4 mg PRN for chest pain. ED precautions discussed. Will obtain CBC, CMET, and FLP to be drawn in the next week or two. Continue ASA, Repatha, and Metoprolol. Heart healthy diet and regular cardiovascular exercise encouraged.   2. Dyslipidemia Total cholesterol 118 in 11/2021. LDL 39. Continue Repatha. Heart healthy diet and regular cardiovascular exercise encouraged. Obtain the following blood work as mentioned above.   3. PAF Denies any tachycardia or palpitations. EKG reveals SR today. Has not had any recurrence since 2016.  CHA2DS2-VASc score is 2 and is currently not on any AC. Only on Aspirin 81 mg daily. If any recurrence of A-fib or any monitor shows this recurrence, will need to discuss starting AC. Not a candidate for Flecainide. Continue ASA and Toprol-XL.   4. HTN BP today, 112/76. BP well controlled at home. Discussed to monitor BP at home at least 2 hours after medications and sitting for 5-10 minutes. Obtain the following blood work as mentioned above. Heart healthy diet  and regular cardiovascular exercise  encouraged.   5. Disposition: Follow-up with Dr. Gwenlyn Found or APP in 3-4 weeks or sooner if anything changes.    Medication Adjustments/Labs and Tests Ordered: Current medicines are reviewed at length with the patient today.  Concerns regarding medicines are outlined above.  Orders Placed This Encounter  Procedures   Lipid panel   CBC   Comprehensive metabolic panel   EKG [id number]   Meds ordered this encounter  Medications   isosorbide mononitrate (IMDUR) 30 MG 24 hr tablet    Sig: Take 0.5 tablets (15 mg total) by mouth daily.    Dispense:  45 tablet    Refill:  3   nitroGLYCERIN (NITROSTAT) 0.4 MG SL tablet    Sig: Place 1 tablet (0.4 mg total) under the tongue every 5 (five) minutes as needed for chest pain.    Dispense:  25 tablet    Refill:  4    Patient Instructions  Medication Instructions:  Your physician has recommended you make the following change in your medication:   Start: Imdur 35m ( half tablet) daily   Start: Nitroglycerin as needed for chest pain  For as needed Nitroglycerin, if you develop chest pain: Sit and rest 5 minutes. If chest pain does not resolve place 1 nitroglycerin under your tongue and wait 5 minutes. If chest pain does not resolve, place a 2nd nitroglycerin under your tongue and wait 5 more minutes. If chest pain does not resolve, place a 3rd nitroglycerin under your tongue and seek emergency services.   *If you need a refill on your cardiac medications before your next appointment, please call your pharmacy*   Lab Work: Please return for Lab work within the next week for Fasting Lipid Panel, Cmp, CBC. You may come to the...   [facility] (3rd floor) [address] [place] [address] Open: 8am-Noon and 1pm-4:30pm  Please ring the doorbell on the small table when you exit the elevator and the Lab Tech will come get you  [place] [facility]313 [address] Monte StreetSLakeside [place]  [id number]: 8am-1pm, then 2pm-4:30pm   [place] Please see attached locations sheet stapled to your lab work with address and hours.   If you have labs (blood work) drawn today and your tests are completely normal, you will receive your results only by: [place](if you have MyChart) OR A paper copy in the mail If you have any lab test that is abnormal or we need to change your treatment, we will call you to review the results.   Testing/Procedures: None ordered today    Follow-Up: [facility] and your [facility] needs are our [facility].  As part of our continuing mission to provide you with exceptional heart care, we have created designated Provider Care Teams.  These Care Teams include your primary Cardiologist (physician) and Advanced Practice Providers (APPs -  Physician Assistants and Nurse Practitioners) who all work together to provide you with the care you need, when you need it.  We recommend signing up for the patient portal called "MyChart".  Sign up information is provided on this After Visit Summary.  MyChart is used to connect with patients for Virtual Visits (Telemedicine).  Patients are able to view lab/test results, encounter notes, upcoming appointments, etc.  Non-urgent messages can be sent to your provider as well.   To learn more about what you can do with MyChart, go to [email or web address]  Your next appointment:   3-4 week(s)  The format for your next appointment:   In Person  Provider:   Quay Burow, MD  or APP   Other Instructions Heart Healthy Diet Recommendations: A low-salt diet is recommended. Meats should be grilled, baked, or boiled. Avoid fried foods. Focus on lean protein sources like fish or chicken with vegetables and fruits. The American Heart Association is a Microbiologist!  American Heart Association Diet and Lifeystyle Recommendations   Exercise recommendations: The American Heart Association recommends 150  minutes of moderate intensity exercise weekly. Try 30 minutes of moderate intensity exercise 4-5 times per week. This could include walking, jogging, or swimming.   Important Information About Sugar         Signed, Finis Bud, NP  04/06/2022 4:33 PM    [place]

## 2022-04-06 ENCOUNTER — Ambulatory Visit (HOSPITAL_BASED_OUTPATIENT_CLINIC_OR_DEPARTMENT_OTHER): Payer: Commercial Managed Care - HMO | Admitting: Nurse Practitioner

## 2022-04-06 ENCOUNTER — Encounter (HOSPITAL_BASED_OUTPATIENT_CLINIC_OR_DEPARTMENT_OTHER): Payer: Self-pay | Admitting: Nurse Practitioner

## 2022-04-06 VITALS — BP 112/76 | HR 75 | Ht 69.0 in | Wt 211.8 lb

## 2022-04-06 DIAGNOSIS — Z955 Presence of coronary angioplasty implant and graft: Secondary | ICD-10-CM | POA: Diagnosis not present

## 2022-04-06 DIAGNOSIS — I480 Paroxysmal atrial fibrillation: Secondary | ICD-10-CM

## 2022-04-06 DIAGNOSIS — E785 Hyperlipidemia, unspecified: Secondary | ICD-10-CM

## 2022-04-06 DIAGNOSIS — I10 Essential (primary) hypertension: Secondary | ICD-10-CM | POA: Diagnosis not present

## 2022-04-06 DIAGNOSIS — I25119 Atherosclerotic heart disease of native coronary artery with unspecified angina pectoris: Secondary | ICD-10-CM | POA: Diagnosis not present

## 2022-04-06 MED ORDER — ISOSORBIDE MONONITRATE ER 30 MG PO TB24: 15.0000 mg | ORAL_TABLET | Freq: Every day | ORAL | 3 refills | Status: DC

## 2022-04-06 MED ORDER — NITROGLYCERIN 0.4 MG SL SUBL: 0.4000 mg | SUBLINGUAL_TABLET | SUBLINGUAL | 4 refills | Status: AC | PRN

## 2022-04-06 NOTE — Patient Instructions (Addendum)
Medication Instructions:  Your physician has recommended you make the following change in your medication:   Start: Imdur '15mg'$  ( half tablet) daily   Start: Nitroglycerin as needed for chest pain  For as needed Nitroglycerin, if you develop chest pain: Sit and rest 5 minutes. If chest pain does not resolve place 1 nitroglycerin under your tongue and wait 5 minutes. If chest pain does not resolve, place a 2nd nitroglycerin under your tongue and wait 5 more minutes. If chest pain does not resolve, place a 3rd nitroglycerin under your tongue and seek emergency services.   *If you need a refill on your cardiac medications before your next appointment, please call your pharmacy*   Lab Work: Please return for Lab work within the next week for Fasting Lipid Panel, Cmp, CBC. You may come to the...   [facility] (3rd floor) [address], [place], [place] [address]  Open: 8am-Noon and 1pm-4:30pm  Please ring the doorbell on the small table when you exit the elevator and the Lab Tech will come get you  [place] [facility] [address] [place], [place],  [address] Open: 8am-1pm, then 2pm-4:30pm   [place]- Please see attached locations sheet stapled to your lab work with address and hours.   If you have labs (blood work) drawn today and your tests are completely normal, you will receive your results only by: [place] (if you have MyChart) OR A paper copy in the mail If you have any lab test that is abnormal or we need to change your treatment, we will call you to review the results.   Testing/Procedures: None ordered today    Follow-Up: At [facility], you and your health needs are our priority.  As part of our continuing mission to provide you with exceptional heart care, we have created designated Provider Care Teams.  These Care Teams include your primary Cardiologist (physician) and Advanced Practice Providers  (APPs -  Physician Assistants and Nurse Practitioners) who all work together to provide you with the care you need, when you need it.  We recommend signing up for the patient portal called "MyChart".  Sign up information is provided on this After Visit Summary.  MyChart is used to connect with patients for Virtual Visits (Telemedicine).  Patients are able to view lab/test results, encounter notes, upcoming appointments, etc.  Non-urgent messages can be sent to your provider as well.   To learn more about what you can do with MyChart, go to [email or web address].    Your next appointment:   3-4 week(s)  The format for your next appointment:   In Person  Provider:   Quay Burow, MD  or APP   Other Instructions Heart Healthy Diet Recommendations: A low-salt diet is recommended. Meats should be grilled, baked, or boiled. Avoid fried foods. Focus on lean protein sources like fish or chicken with vegetables and fruits. The American Heart Association is a Microbiologist!  American Heart Association Diet and Lifeystyle Recommendations   Exercise recommendations: The American Heart Association recommends 150 minutes of moderate intensity exercise weekly. Try 30 minutes of moderate intensity exercise 4-5 times per week. This could include walking, jogging, or swimming.   Important Information About Sugar

## 2022-04-13 LAB — COMPREHENSIVE METABOLIC PANEL
ALT: 55 IU/L — ABNORMAL HIGH (ref 0–44)
AST: 33 IU/L (ref 0–40)
Albumin/Globulin Ratio: 1.8 (ref 1.2–2.2)
Albumin: 4.3 g/dL (ref 3.9–4.9)
Alkaline Phosphatase: 50 IU/L (ref 44–121)
BUN/Creatinine Ratio: 13 (ref 10–24)
BUN: 14 mg/dL (ref 8–27)
Bilirubin Total: 0.5 mg/dL (ref 0.0–1.2)
CO2: 19 mmol/L — ABNORMAL LOW (ref 20–29)
Calcium: 9.2 mg/dL (ref 8.6–10.2)
Chloride: 105 mmol/L (ref 96–106)
Creatinine, Ser: 1.09 mg/dL (ref 0.76–1.27)
Globulin, Total: 2.4 g/dL (ref 1.5–4.5)
Glucose: 147 mg/dL — ABNORMAL HIGH (ref 70–99)
Potassium: 4.6 mmol/L (ref 3.5–5.2)
Sodium: 140 mmol/L (ref 134–144)
Total Protein: 6.7 g/dL (ref 6.0–8.5)
eGFR: 76 mL/min/{1.73_m2} (ref >59)

## 2022-04-13 LAB — CBC
Hematocrit: 43.9 % (ref 37.5–51.0)
Hemoglobin: 14.7 g/dL (ref 13.0–17.7)
MCH: 30.4 pg (ref 26.6–33.0)
MCHC: 33.5 g/dL (ref 31.5–35.7)
MCV: 91 fL (ref 79–97)
Platelets: 212 10*3/uL (ref 150–450)
RBC: 4.83 x10E6/uL (ref 4.14–5.80)
RDW: 12.5 % (ref 11.6–15.4)
WBC: 5.5 10*3/uL (ref 3.4–10.8)

## 2022-04-13 LAB — LIPID PANEL
Chol/HDL Ratio: 2.7 ratio (ref 0.0–5.0)
Cholesterol, Total: 161 mg/dL (ref 100–199)
HDL: 59 mg/dL (ref >39)
LDL Chol Calc (NIH): 80 mg/dL (ref 0–99)
Triglycerides: 125 mg/dL (ref 0–149)
VLDL Cholesterol Cal: 22 mg/dL (ref 5–40)

## 2022-04-27 ENCOUNTER — Encounter: Payer: Self-pay | Admitting: Cardiovascular Disease

## 2022-04-27 ENCOUNTER — Ambulatory Visit: Payer: Commercial Managed Care - HMO | Attending: Cardiovascular Disease | Admitting: Cardiovascular Disease

## 2022-04-27 VITALS — BP 106/70 | HR 69 | Ht 69.0 in | Wt 209.0 lb

## 2022-04-27 DIAGNOSIS — Z9861 Coronary angioplasty status: Secondary | ICD-10-CM | POA: Diagnosis not present

## 2022-04-27 DIAGNOSIS — I10 Essential (primary) hypertension: Secondary | ICD-10-CM | POA: Diagnosis not present

## 2022-04-27 DIAGNOSIS — E785 Hyperlipidemia, unspecified: Secondary | ICD-10-CM | POA: Diagnosis not present

## 2022-04-27 DIAGNOSIS — I2510 Atherosclerotic heart disease of native coronary artery without angina pectoris: Secondary | ICD-10-CM | POA: Diagnosis not present

## 2022-04-27 NOTE — Assessment & Plan Note (Signed)
History of CAD status post cardiac catheterization by myself 02/05/2016 via the right radial approach.  He developed ventricular fibrillation when I injected his RCA requiring CPR and defibrillation.  Ultimately he underwent staged RCA intervention by myself via the femoral approach 03/10/2016.  He had no other significant CAD.  He does complain of some mild effort angina which resolves with continued activity.  He saw one of our APP's several weeks ago who began him on Imdur which she did not tolerate.  He was having the same symptoms 6 months ago when I saw him which have not changed in frequency or severity.  At this point, I do not feel that this represents ischemically mediated symptoms I will continue to follow him conservatively.

## 2022-04-27 NOTE — Progress Notes (Signed)
04/27/2022 [name]   05-10-1956  [id number]  Primary Physician Plotnikov, Evie Lacks, MD Primary Cardiologist: Lorretta Harp MD [name], [place]  HPI:  [name] is a 66 y.o.  single [place] male whose mother is a patient of mine. I last saw him in the office 12/08/2021. He has a history of treated hypertension, hyperlipidemia and family history for heart disease. He is complained of chest pain off and on for several years. Somewhat exertional. He did have a stress test done in January that was low risk. He's had a persistent cough over the last 6 months. Recent chest CT performed 01/08/16 showed no significant pulmonary abnormalities but there was a small pericardial effusion and calcium in the LAD territory. He underwent outpatient cardiac catheterization on 02/05/16 B right radial approach revealing a normal left system with moderate hypodense mid dominant RCA stenosis. He developed ventricular fibrillation after the third coronary injection requiring CPR and defibrillation. Because of ongoing chest pain I performed PCI and drug-eluting stenting of his mid RCA via the right femoral approach 03/10/16. The procedure was uncomplicated. He continues to have atypical chest pain. His PCP discontinued the statin drug for presumed "statin intolerance".     Unfortunately, he lost his mother who is also a patient of mine.  He had a Myoview showed diaphragmatic attenuation without ischemia performed 08/26/2020 and an event monitor showed no arrhythmias.  Since I saw him in the office 5 months ago he continues to do well.  He still has some mild effort angina that is worse when he begins exercising and then resolve spontaneously.  He saw one of our APP's, Finis Bud, who began him on low-dose Imdur which she did not tolerate.     Current Meds  Medication Sig   amLODipine (NORVASC) 5 MG tablet Take 1 tablet (5 mg total) by mouth daily.   aspirin EC 81 MG tablet Take 1  tablet (81 mg total) by mouth daily.   Blood Glucose Monitoring Suppl (ONETOUCH VERIO) w/Device KIT 1 Units by Does not apply route daily as needed.   Cholecalciferol (VITAMIN D3) 50 MCG (2000 UT) capsule Take 1 capsule (2,000 Units total) by mouth daily.   doxazosin (CARDURA) 8 MG tablet Take 0.5 tablets (4 mg total) by mouth 2 (two) times daily.   Evolocumab (REPATHA SURECLICK) 889 MG/ML SOAJ INJECT 1 ML INTO THE SKIN EVERY 14 DAYS   finasteride (PROSCAR) 5 MG tablet Take 1 tablet (5 mg total) by mouth daily.   glucose blood (ONETOUCH VERIO) test strip Use as instructed   Lancets (ONETOUCH ULTRASOFT) lancets Use as instructed   metFORMIN (GLUCOPHAGE) 500 MG tablet Take 1 tablet (500 mg total) by mouth daily with breakfast. Overdue for follow- appt must see provider for future refills   metoprolol succinate (TOPROL-XL) 25 MG 24 hr tablet Take 1/2 (one-half) tablet by mouth once daily   nitroGLYCERIN (NITROSTAT) 0.4 MG SL tablet Place 1 tablet (0.4 mg total) under the tongue every 5 (five) minutes as needed for chest pain.   repaglinide (PRANDIN) 1 MG tablet TAKE 1 TABLET BY MOUTH THREE TIMES DAILY BEFORE MEAL(S)     Allergies  Allergen Reactions   Flomax [Tamsulosin Hcl] Other (See Comments)    Hard time breathing   Atorvastatin Other (See Comments)    REACTION: leg cramps per patient with a large dose    Social History   Socioeconomic History   Marital status: Divorced    Spouse  name: Not on file   Number of children: 1   Years of education: Not on file   Highest education level: Not on file  Occupational History   Occupation: Animal nutritionist  Tobacco Use   Smoking status: Never   Smokeless tobacco: Never  Vaping Use   Vaping Use: Never used  Substance and Sexual Activity   Alcohol use: Not Currently   Drug use: No   Sexual activity: Never    Birth control/protection: Abstinence  Other Topics Concern   Not on file  Social History Narrative   Coffee daily    Social  Determinants of Health   Financial Resource Strain: Not on file  Food Insecurity: Not on file  Transportation Needs: Not on file  Physical Activity: Not on file  Stress: Not on file  Social Connections: Not on file  Intimate Partner Violence: Not on file     Review of Systems: General: negative for chills, fever, night sweats or weight changes.  Cardiovascular: negative for chest pain, dyspnea on exertion, edema, orthopnea, palpitations, paroxysmal nocturnal dyspnea or shortness of breath Dermatological: negative for rash Respiratory: negative for cough or wheezing Urologic: negative for hematuria Abdominal: negative for nausea, vomiting, diarrhea, bright red blood per rectum, melena, or hematemesis Neurologic: negative for visual changes, syncope, or dizziness All other systems reviewed and are otherwise negative except as noted above.    Blood pressure 106/70, pulse 69, height _0  (1.753 m), weight 209 lb (94.8 kg), SpO2 93 %.  General appearance: alert and no distress Neck: no adenopathy, no carotid bruit, no JVD, supple, symmetrical, trachea midline, and thyroid not enlarged, symmetric, no tenderness/mass/nodules Lungs: clear to auscultation bilaterally Heart: regular rate and rhythm, S1, S2 normal, no murmur, click, rub or gallop Extremities: extremities normal, atraumatic, no cyanosis or edema Pulses: 2+ and symmetric Skin: Skin color, texture, turgor normal. No rashes or lesions Neurologic: Grossly normal  EKG not performed today  ASSESSMENT AND PLAN:   Dyslipidemia History of dyslipidemia on Repatha with lipid profile performed 04/12/2022 revealing a total cholesterol of 161, LDL of 80 and HDL of 59.  Essential hypertension History of essential hypertension with blood pressure measured today at 106/70.  He is on amlodipine and metoprolol.  CAD S/P percutaneous coronary angioplasty History of CAD status post cardiac catheterization by myself 02/05/2016 via the  right radial approach.  He developed ventricular fibrillation when I injected his RCA requiring CPR and defibrillation.  Ultimately he underwent staged RCA intervention by myself via the femoral approach 03/10/2016.  He had no other significant CAD.  He does complain of some mild effort angina which resolves with continued activity.  He saw one of our APP's several weeks ago who began him on Imdur which she did not tolerate.  He was having the same symptoms 6 months ago when I saw him which have not changed in frequency or severity.  At this point, I do not feel that this represents ischemically mediated symptoms I will continue to follow him conservatively.     Lorretta Harp MD FACP,FACC,FAHA, [facility] 04/27/2022 3:48 PM

## 2022-04-27 NOTE — Assessment & Plan Note (Signed)
History of dyslipidemia on Repatha with lipid profile performed 04/12/2022 revealing a total cholesterol of 161, LDL of 80 and HDL of 59.

## 2022-04-27 NOTE — Patient Instructions (Signed)
Medication Instructions:  Your physician recommends that you continue on your current medications as directed. Please refer to the Current Medication list given to you today.  *If you need a refill on your cardiac medications before your next appointment, please call your pharmacy*   Follow-Up: At [facility], you and your health needs are our priority.  As part of our continuing mission to provide you with exceptional heart care, we have created designated Provider Care Teams.  These Care Teams include your primary Cardiologist (physician) and Advanced Practice Providers (APPs -  Physician Assistants and Nurse Practitioners) who all work together to provide you with the care you need, when you need it.  We recommend signing up for the patient portal called "MyChart".  Sign up information is provided on this After Visit Summary.  MyChart is used to connect with patients for Virtual Visits (Telemedicine).  Patients are able to view lab/test results, encounter notes, upcoming appointments, etc.  Non-urgent messages can be sent to your provider as well.   To learn more about what you can do with MyChart, go to [email or web address].    Your next appointment:   6 month(s)  The format for your next appointment:   In Person  Provider:   Sande Rives, PA-C or Almyra Deforest, PA-C       Then, Quay Burow, MD will plan to see you again in 12 month(s).

## 2022-04-27 NOTE — Assessment & Plan Note (Signed)
History of essential hypertension with blood pressure measured today at 106/70.  He is on amlodipine and metoprolol.

## 2022-05-12 ENCOUNTER — Other Ambulatory Visit: Payer: Self-pay | Admitting: Internal Medicine

## 2022-05-12 MED ORDER — METFORMIN HCL 500 MG PO TABS: 500.0000 mg | ORAL_TABLET | Freq: Every day | ORAL | 0 refills | Status: DC

## 2022-06-15 ENCOUNTER — Encounter: Payer: Self-pay | Admitting: Cardiovascular Disease

## 2022-06-20 ENCOUNTER — Other Ambulatory Visit: Payer: Self-pay | Admitting: Internal Medicine

## 2022-08-05 ENCOUNTER — Other Ambulatory Visit: Payer: Self-pay | Admitting: Internal Medicine

## 2022-09-01 ENCOUNTER — Other Ambulatory Visit (HOSPITAL_COMMUNITY): Payer: Self-pay

## 2022-09-01 ENCOUNTER — Telehealth: Payer: Self-pay

## 2022-09-01 NOTE — Telephone Encounter (Signed)
Please note that RX Repatha is no longer on the patients formulary. However; rx Praluent '75mg'$ /ml and praluent 150 are on the pts formulary. Please advise if you would like the P/A to be done, and if so which strength as a request from the pts preferred pharmacy has reached out to [place] via fax about this concern

## 2022-09-02 ENCOUNTER — Other Ambulatory Visit (HOSPITAL_COMMUNITY): Payer: Self-pay

## 2022-09-02 ENCOUNTER — Encounter: Payer: Self-pay | Admitting: Internal Medicine

## 2022-09-02 ENCOUNTER — Telehealth: Payer: Self-pay

## 2022-09-02 NOTE — Telephone Encounter (Signed)
Pharmacy Patient Advocate Encounter   Received notification from Pharm-D that prior authorization for Praluent '150mg'$ /ml is required/requested.     PA submitted on 3.14.24 to (ins) [employer] via CoverMyMeds Key confirmation # [id number]  Status is pending

## 2022-09-03 NOTE — Telephone Encounter (Signed)
P/A for Praluent 150mg /ml has been approved. Effective dates 09/03/2022 - 03/06/2023   [id number]

## 2022-09-05 ENCOUNTER — Other Ambulatory Visit: Payer: Self-pay | Admitting: Internal Medicine

## 2022-09-05 MED ORDER — REPAGLINIDE 1 MG PO TABS: ORAL_TABLET | ORAL | 0 refills | Status: DC

## 2022-09-05 MED ORDER — METFORMIN HCL 500 MG PO TABS: 500.0000 mg | ORAL_TABLET | Freq: Two times a day (BID) | ORAL | 0 refills | Status: DC

## 2022-09-05 NOTE — Progress Notes (Signed)
Rx

## 2022-09-06 NOTE — Telephone Encounter (Signed)
Spoke with patient and explained change from [place] to [place].  Please do PA for 150 mg dose

## 2022-09-10 MED ORDER — PRALUENT 75 MG/ML ~~LOC~~ SOAJ: 1.0000 | SUBCUTANEOUS | 11 refills | Status: DC

## 2022-09-10 NOTE — Telephone Encounter (Signed)
Received fax that insurance prefers Praluent and not Repatha. Looks like PA was already done for [employer] but pt not made aware and rx not sent in. I have sent in new rx and alerted pt to med change via [place] interpreter.  Starting June 1, he changes to [facility] Advantage. Advised him to send [place] updated insurance info in June as they may prefer Repatha again.

## 2022-09-10 NOTE — Telephone Encounter (Signed)
Pt called back, states pharmacy quoted him $500 for Praluent. I activated a $50 copay card for him:  BIN [id number] PCN CN [id number] [id number] ID [id number]  Pharmacy unable to process since card is powered by [employer] and system still down.  Called pt again via interpreter. Pharmacy states they will try to reprocess every few days.

## 2022-09-16 ENCOUNTER — Encounter: Payer: Self-pay | Admitting: Internal Medicine

## 2022-09-17 NOTE — Telephone Encounter (Signed)
Called pharmacy, copay card still not working. Will try calling again in another week.

## 2022-09-20 ENCOUNTER — Other Ambulatory Visit: Payer: Self-pay | Admitting: Internal Medicine

## 2022-09-20 MED ORDER — FINASTERIDE 5 MG PO TABS: 5.0000 mg | ORAL_TABLET | Freq: Every day | ORAL | 0 refills | Status: DC

## 2022-09-20 MED ORDER — DOXAZOSIN MESYLATE 8 MG PO TABS: ORAL_TABLET | ORAL | 0 refills | Status: DC

## 2022-09-21 ENCOUNTER — Ambulatory Visit (INDEPENDENT_AMBULATORY_CARE_PROVIDER_SITE_OTHER): Payer: PRIVATE HEALTH INSURANCE | Admitting: Internal Medicine

## 2022-09-21 ENCOUNTER — Encounter: Payer: Self-pay | Admitting: Internal Medicine

## 2022-09-21 VITALS — BP 110/78 | HR 67 | Temp 98.3°F | Ht 69.0 in | Wt 210.0 lb

## 2022-09-21 DIAGNOSIS — Z9861 Coronary angioplasty status: Secondary | ICD-10-CM | POA: Diagnosis not present

## 2022-09-21 DIAGNOSIS — F4321 Adjustment disorder with depressed mood: Secondary | ICD-10-CM | POA: Diagnosis not present

## 2022-09-21 DIAGNOSIS — N320 Bladder-neck obstruction: Secondary | ICD-10-CM | POA: Diagnosis not present

## 2022-09-21 DIAGNOSIS — I480 Paroxysmal atrial fibrillation: Secondary | ICD-10-CM | POA: Diagnosis not present

## 2022-09-21 DIAGNOSIS — E0865 Diabetes mellitus due to underlying condition with hyperglycemia: Secondary | ICD-10-CM

## 2022-09-21 DIAGNOSIS — E538 Deficiency of other specified B group vitamins: Secondary | ICD-10-CM

## 2022-09-21 DIAGNOSIS — I2510 Atherosclerotic heart disease of native coronary artery without angina pectoris: Secondary | ICD-10-CM | POA: Diagnosis not present

## 2022-09-21 DIAGNOSIS — E785 Hyperlipidemia, unspecified: Secondary | ICD-10-CM

## 2022-09-21 NOTE — Assessment & Plan Note (Addendum)
Cont on ASA 81 mg/d On Repatha, Metoprolol

## 2022-09-21 NOTE — Progress Notes (Signed)
Subjective:  Patient ID: [name], male    DOB: [birth year]  Age: 67 y.o. MRN: [id number]  CC: Follow-up (INCREASE IN FASTING BLOOD SUGARS)   HPI [name] presents for DM -2, CAD, HTN CBGs 125-150 at home  Outpatient Medications Prior to Visit  Medication Sig Dispense Refill   Alirocumab (PRALUENT) 75 MG/ML SOAJ Inject 1 Pen into the skin every 14 (fourteen) days. 2 mL 11   amLODipine (NORVASC) 5 MG tablet Take 1 tablet (5 mg total) by mouth daily. 90 tablet 3   aspirin EC 81 MG tablet Take 1 tablet (81 mg total) by mouth daily. 30 tablet 0   Blood Glucose Monitoring Suppl (ONETOUCH VERIO) w/Device KIT 1 Units by Does not apply route daily as needed. 1 kit 1   Cholecalciferol (VITAMIN D3) 50 MCG (2000 UT) capsule Take 1 capsule (2,000 Units total) by mouth daily. 100 capsule 3   clopidogrel (PLAVIX) 75 MG tablet Take by mouth.     doxazosin (CARDURA) 8 MG tablet Take 1/2 (one-half) tablet by mouth twice daily 90 tablet 0   finasteride (PROSCAR) 5 MG tablet Take 1 tablet (5 mg total) by mouth daily. 90 tablet 0   glucose blood (ONETOUCH VERIO) test strip Use as instructed 50 each 11   Lancets (ONETOUCH ULTRASOFT) lancets Use as instructed 100 each 12   metFORMIN (GLUCOPHAGE) 500 MG tablet Take 1 tablet (500 mg total) by mouth 2 (two) times daily with a meal. 60 tablet 0   metoprolol succinate (TOPROL-XL) 25 MG 24 hr tablet Take 1/2 (one-half) tablet by mouth once daily 90 tablet 3   repaglinide (PRANDIN) 1 MG tablet TAKE 1 TABLET BY MOUTH THREE TIMES DAILY BEFORE MEAL(S) 90 tablet 0   nitroGLYCERIN (NITROSTAT) 0.4 MG SL tablet Place 1 tablet (0.4 mg total) under the tongue every 5 (five) minutes as needed for chest pain. 25 tablet 4   No facility-administered medications prior to visit.    ROS: Review of Systems  Constitutional:  Negative for appetite change, fatigue and unexpected weight change.  HENT:  Negative for congestion, nosebleeds, sneezing, sore  throat and trouble swallowing.   Eyes:  Negative for itching and visual disturbance.  Respiratory:  Negative for cough.   Cardiovascular:  Negative for chest pain, palpitations and leg swelling.  Gastrointestinal:  Negative for abdominal distention, blood in stool, diarrhea and nausea.  Genitourinary:  Negative for frequency and hematuria.  Musculoskeletal:  Negative for back pain, gait problem, joint swelling and neck pain.  Skin:  Negative for rash.  Neurological:  Negative for dizziness, tremors, speech difficulty and weakness.  Psychiatric/Behavioral:  Negative for agitation, dysphoric mood and sleep disturbance. The patient is not nervous/anxious.     Objective:  BP 110/78 (BP Location: Right Arm, Patient Position: Sitting, Cuff Size: Large)   Pulse 67   Temp 98.3 F (36.8 C) (Oral)   Ht 5\' 9"  (1.753 m)   Wt 210 lb (95.3 kg)   SpO2 97%   BMI 31.01 kg/m   BP Readings from Last 3 Encounters:  09/21/22 110/78  04/27/22 106/70  04/06/22 112/76    Wt Readings from Last 3 Encounters:  09/21/22 210 lb (95.3 kg)  04/27/22 209 lb (94.8 kg)  04/06/22 211 lb 12.8 oz (96.1 kg)    Physical Exam Constitutional:      General: He is not in acute distress.    Appearance: He is well-developed.     Comments: NAD  Eyes:  Conjunctiva/sclera: Conjunctivae normal.     Pupils: Pupils are equal, round, and reactive to light.  Neck:     Thyroid: No thyromegaly.     Vascular: No JVD.  Cardiovascular:     Rate and Rhythm: Normal rate and regular rhythm.     Heart sounds: Normal heart sounds. No murmur heard.    No friction rub. No gallop.  Pulmonary:     Effort: Pulmonary effort is normal. No respiratory distress.     Breath sounds: Normal breath sounds. No wheezing or rales.  Chest:     Chest wall: No tenderness.  Abdominal:     General: Bowel sounds are normal. There is no distension.     Palpations: Abdomen is soft. There is no mass.     Tenderness: There is no abdominal  tenderness. There is no guarding or rebound.  Musculoskeletal:        General: No tenderness. Normal range of motion.     Cervical back: Normal range of motion.  Lymphadenopathy:     Cervical: No cervical adenopathy.  Skin:    General: Skin is warm and dry.     Findings: No rash.  Neurological:     Mental Status: He is alert and oriented to person, place, and time.     Cranial Nerves: No cranial nerve deficit.     Motor: No abnormal muscle tone.     Coordination: Coordination normal.     Gait: Gait normal.     Deep Tendon Reflexes: Reflexes are normal and symmetric.  Psychiatric:        Behavior: Behavior normal.        Thought Content: Thought content normal.        Judgment: Judgment normal.     Lab Results  Component Value Date   WBC 6.5 09/22/2022   HGB 14.9 09/22/2022   HCT 42.7 09/22/2022   PLT 206.0 09/22/2022   GLUCOSE 143 (H) 09/22/2022   CHOL 133 09/22/2022   TRIG 131.0 09/22/2022   HDL 54.80 09/22/2022   LDLDIRECT 110.0 04/15/2017   LDLCALC 52 09/22/2022   ALT 49 09/22/2022   AST 31 09/22/2022   NA 137 09/22/2022   K 4.3 09/22/2022   CL 105 09/22/2022   CREATININE 1.01 09/22/2022   BUN 17 09/22/2022   CO2 25 09/22/2022   TSH 3.03 09/22/2022   PSA 0.05 (L) 09/22/2022   INR 1.0 03/10/2016   HGBA1C 6.6 (H) 09/22/2022    Myocardial Perfusion Imaging  Result Date: 08/26/2020  Nuclear stress EF: 53%.  The left ventricular ejection fraction is mildly decreased (45-54%).  There was no ST segment deviation noted during stress.  Defect 1: There is a small defect of mild severity present in the basal anterior and mid anterior location.  Defect 2: There is a large defect of severe severity present in the basal inferior and mid inferior location.  Findings consistent with ischemia.  This is a low risk study.  Abnormal, low risk stress nuclear study with prominent diaphragmatic attenuation/inferior thinning and very mild ischemia in the mid anterior wall.  Gated  ejection fraction low normal at 53%.    Assessment & Plan:   Problem List Items Addressed This Visit       Cardiovascular and Mediastinum   CAD S/P percutaneous coronary angioplasty - Primary    Cont on ASA 81 mg/d On Repatha, Metoprolol      Relevant Orders   CBC with Differential/Platelet (Completed)   Comprehensive metabolic panel (Completed)  Hemoglobin A1c (Completed)   Lipid panel (Completed)   Urinalysis (Completed)   TSH (Completed)   PSA (Completed)   PAF (paroxysmal atrial fibrillation) (HCC)    Cont on ASA 81 mg/d No recent episodes      Relevant Orders   CBC with Differential/Platelet (Completed)   Comprehensive metabolic panel (Completed)   Hemoglobin A1c (Completed)   Lipid panel (Completed)   Urinalysis (Completed)   TSH (Completed)   PSA (Completed)     Endocrine   Diabetes mellitus due to underlying condition, uncontrolled, with hyperglycemia   Relevant Orders   Hemoglobin A1c (Completed)     Other   B12 deficiency    On B complex      Dyslipidemia    On Repatha      Grief    Mom died in March 2022      Other Visit Diagnoses     Bladder-neck obstruction       Relevant Orders   PSA (Completed)         No orders of the defined types were placed in this encounter.     Follow-up: Return in about 3 months (around 12/21/2022) for a follow-up visit.  Sonda PrimesAlex Daphna Lafuente, MD

## 2022-09-21 NOTE — Assessment & Plan Note (Signed)
On Repatha 

## 2022-09-21 NOTE — Assessment & Plan Note (Addendum)
Cont on ASA 81 mg/d No recent episodes

## 2022-09-21 NOTE — Assessment & Plan Note (Signed)
On B complex 

## 2022-09-21 NOTE — Assessment & Plan Note (Signed)
Mom died in March 2022 °

## 2022-09-22 LAB — COMPREHENSIVE METABOLIC PANEL
ALT: 49 U/L (ref 0–53)
AST: 31 U/L (ref 0–37)
Albumin: 4.2 g/dL (ref 3.5–5.2)
Alkaline Phosphatase: 41 U/L (ref 39–117)
BUN: 17 mg/dL (ref 6–23)
CO2: 25 mEq/L (ref 19–32)
Calcium: 8.9 mg/dL (ref 8.4–10.5)
Chloride: 105 mEq/L (ref 96–112)
Creatinine, Ser: 1.01 mg/dL (ref 0.40–1.50)
GFR: 78.45 mL/min (ref >60.00)
Glucose, Bld: 143 mg/dL — ABNORMAL HIGH (ref 70–99)
Potassium: 4.3 mEq/L (ref 3.5–5.1)
Sodium: 137 mEq/L (ref 135–145)
Total Bilirubin: 0.6 mg/dL (ref 0.2–1.2)
Total Protein: 6.6 g/dL (ref 6.0–8.3)

## 2022-09-22 LAB — PSA: PSA: 0.05 ng/mL — ABNORMAL LOW (ref 0.10–4.00)

## 2022-09-22 LAB — CBC WITH DIFFERENTIAL/PLATELET
Basophils Absolute: 0 10*3/uL (ref 0.0–0.1)
Basophils Relative: 0.6 % (ref 0.0–3.0)
Eosinophils Absolute: 0.2 10*3/uL (ref 0.0–0.7)
Eosinophils Relative: 3.6 % (ref 0.0–5.0)
HCT: 42.7 % (ref 39.0–52.0)
Hemoglobin: 14.9 g/dL (ref 13.0–17.0)
Lymphocytes Relative: 26.5 % (ref 12.0–46.0)
Lymphs Abs: 1.7 10*3/uL (ref 0.7–4.0)
MCHC: 34.9 g/dL (ref 30.0–36.0)
MCV: 90 fl (ref 78.0–100.0)
Monocytes Absolute: 0.7 10*3/uL (ref 0.1–1.0)
Monocytes Relative: 10.9 % (ref 3.0–12.0)
Neutro Abs: 3.8 10*3/uL (ref 1.4–7.7)
Neutrophils Relative %: 58.4 % (ref 43.0–77.0)
Platelets: 206 10*3/uL (ref 150.0–400.0)
RBC: 4.74 Mil/uL (ref 4.22–5.81)
RDW: 13.3 % (ref 11.5–15.5)
WBC: 6.5 10*3/uL (ref 4.0–10.5)

## 2022-09-22 LAB — URINALYSIS
Bilirubin Urine: NEGATIVE
Hgb urine dipstick: NEGATIVE
Ketones, ur: NEGATIVE
Leukocytes,Ua: NEGATIVE
Nitrite: NEGATIVE
Specific Gravity, Urine: 1.025 (ref 1.000–1.030)
Total Protein, Urine: NEGATIVE
Urine Glucose: NEGATIVE
Urobilinogen, UA: 0.2 (ref 0.0–1.0)
pH: 6 (ref 5.0–8.0)

## 2022-09-22 LAB — LIPID PANEL
Cholesterol: 133 mg/dL (ref 0–200)
HDL: 54.8 mg/dL (ref >39.00)
LDL Cholesterol: 52 mg/dL (ref 0–99)
NonHDL: 78.15
Total CHOL/HDL Ratio: 2
Triglycerides: 131 mg/dL (ref 0.0–149.0)
VLDL: 26.2 mg/dL (ref 0.0–40.0)

## 2022-09-22 LAB — TSH: TSH: 3.03 u[IU]/mL (ref 0.35–5.50)

## 2022-09-22 LAB — HEMOGLOBIN A1C: Hgb A1c MFr Bld: 6.6 % — ABNORMAL HIGH (ref 4.6–6.5)

## 2022-09-23 NOTE — Telephone Encounter (Signed)
Called pharmacy for another update, copay card still down.

## 2022-09-27 ENCOUNTER — Encounter: Payer: Self-pay | Admitting: Internal Medicine

## 2022-09-29 ENCOUNTER — Other Ambulatory Visit: Payer: Self-pay | Admitting: Internal Medicine

## 2022-09-29 MED ORDER — METFORMIN HCL 500 MG PO TABS: 500.0000 mg | ORAL_TABLET | Freq: Two times a day (BID) | ORAL | 11 refills | Status: DC

## 2022-09-29 MED ORDER — REPAGLINIDE 1 MG PO TABS: ORAL_TABLET | ORAL | 11 refills | Status: AC

## 2022-09-30 ENCOUNTER — Encounter: Payer: Self-pay | Admitting: Pharmacist

## 2022-09-30 NOTE — Telephone Encounter (Signed)
Copay card now working, rx copay is $50 again at the pharmacy, sent mychart message to pt letting him know.

## 2022-10-11 NOTE — Telephone Encounter (Signed)
Patient reports he still has commercial insurance till June 2024. Advised to use Praluent every 3 weeks instead of every 2 weeks so supply would last little longer and when he get medicare in 11/2022 we will revisit the coverage.

## 2022-12-13 ENCOUNTER — Encounter: Payer: Self-pay | Admitting: Cardiovascular Disease

## 2022-12-15 ENCOUNTER — Other Ambulatory Visit (HOSPITAL_COMMUNITY): Payer: Self-pay

## 2022-12-15 ENCOUNTER — Telehealth: Payer: Self-pay

## 2022-12-15 MED ORDER — REPATHA SURECLICK 140 MG/ML ~~LOC~~ SOAJ: 140.0000 mg | SUBCUTANEOUS | 11 refills | Status: DC

## 2022-12-15 NOTE — Telephone Encounter (Signed)
Pharmacy Patient Advocate Encounter  Prior Authorization for REPATHA has been approved.    Effective dates: 11/20/22 through 12/15/23  Haze Rushing, CPhT Pharmacy Patient Advocate Specialist Direct Number: [phone] Fax: [phone]

## 2022-12-15 NOTE — Telephone Encounter (Addendum)
Pt notified via mychart (previously on Repatha, then insurance made him change to Praluent a few months ago, now has new [employer] that covers Repatha again).

## 2022-12-15 NOTE — Telephone Encounter (Signed)
Pharmacy Patient Advocate Encounter   Received notification from [employer] that prior authorization for REPATHA 140 MG/ML INJ is needed.    PA submitted on 12/15/22 Key [id number] Status is pending  Haze Rushing, CPhT Pharmacy Patient Advocate Specialist Direct Number: [phone] Fax: [phone]

## 2022-12-21 ENCOUNTER — Ambulatory Visit: Payer: Self-pay | Admitting: Internal Medicine

## 2022-12-27 ENCOUNTER — Other Ambulatory Visit: Payer: Self-pay | Admitting: Internal Medicine

## 2022-12-27 ENCOUNTER — Encounter: Payer: Self-pay | Admitting: Cardiovascular Disease

## 2022-12-27 MED ORDER — DOXAZOSIN MESYLATE 8 MG PO TABS: ORAL_TABLET | ORAL | 1 refills | Status: DC

## 2022-12-27 MED ORDER — AMLODIPINE BESYLATE 5 MG PO TABS: 5.0000 mg | ORAL_TABLET | Freq: Every day | ORAL | 1 refills | Status: DC

## 2022-12-27 MED ORDER — FINASTERIDE 5 MG PO TABS: 5.0000 mg | ORAL_TABLET | Freq: Every day | ORAL | 1 refills | Status: DC

## 2023-01-18 ENCOUNTER — Other Ambulatory Visit: Payer: Self-pay | Admitting: Physician Assistant

## 2023-01-18 ENCOUNTER — Encounter: Payer: Self-pay | Admitting: Physician Assistant

## 2023-01-18 ENCOUNTER — Ambulatory Visit: Payer: Medicare (Managed Care) | Admitting: Physician Assistant

## 2023-01-18 VITALS — BP 110/78 | HR 76 | Ht 69.0 in | Wt 207.4 lb

## 2023-01-18 DIAGNOSIS — E785 Hyperlipidemia, unspecified: Secondary | ICD-10-CM | POA: Diagnosis not present

## 2023-01-18 DIAGNOSIS — I2510 Atherosclerotic heart disease of native coronary artery without angina pectoris: Secondary | ICD-10-CM | POA: Diagnosis not present

## 2023-01-18 DIAGNOSIS — I480 Paroxysmal atrial fibrillation: Secondary | ICD-10-CM | POA: Diagnosis not present

## 2023-01-18 DIAGNOSIS — I10 Essential (primary) hypertension: Secondary | ICD-10-CM | POA: Diagnosis not present

## 2023-01-18 NOTE — Patient Instructions (Signed)
Medication Instructions:  Your physician recommends that you continue on your current medications as directed. Please refer to the Current Medication list given to you today.  *If you need a refill on your cardiac medications before your next appointment, please call your pharmacy*   Lab Work: COME BACK TOMORROW, 01/19/23, FASTING, AFTER 8:00 A.M.:  LIPID PANEL  If you have labs (blood work) drawn today and your tests are completely normal, you will receive your results only by: MyChart Message (if you have MyChart) OR A paper copy in the mail If you have any lab test that is abnormal or we need to change your treatment, we will call you to review the results.   Testing/Procedures: None    Follow-Up: At [facility], you and your health needs are our priority.  As part of our continuing mission to provide you with exceptional heart care, we have created designated Provider Care Teams.  These Care Teams include your primary Cardiologist (physician) and Advanced Practice Providers (APPs -  Physician Assistants and Nurse Practitioners) who all work together to provide you with the care you need, when you need it.  We recommend signing up for the patient portal called "MyChart".  Sign up information is provided on this After Visit Summary.  MyChart is used to connect with patients for Virtual Visits (Telemedicine).  Patients are able to view lab/test results, encounter notes, upcoming appointments, etc.  Non-urgent messages can be sent to your provider as well.   To learn more about what you can do with MyChart, go to [email or web address].    Your next appointment:   6 month(s)  Provider:   Nanetta Batty, MD     Other Instructions

## 2023-01-18 NOTE — Progress Notes (Unsigned)
Cardiology Office Note:  .   Date:  01/18/2023  ID:  [name], DOB 1955/08/20, MRN [id number] PCP: Jon Garter, MD  [facility] Providers Cardiologist:  Jon Batty, MD { Click to update primary MD,subspecialty MD or APP then REFRESH:1}   History of Present Illness: [name] Kitchen   [name] is a 67 y.o. male with past medical history of hypertension, hyperlipidemia, DM2, PAF on ASA and CAD.  He was admitted in July 2016 for palpitation.  EKG showed wide-complex tachycardia with heart rate of 150s.  He converted spontaneously to sinus rhythm by himself without any medication.  The EKG was reviewed by Dr. Ladona Ridgel and I felt to be atrial fibrillation with aberrancy.  He was not started on AV nodal blocking agent due to baseline bradycardia.  He was placed on pill in pocket flecainide for palpitation longer than 5 minutes.  He has no further recurrence since then.  He has had chest pain on and off for several years.  He underwent cardiac catheterization in August 2017 that revealed moderate mid RCA disease.  He developed V-fib after the third coronary injection requiring CPR and defibrillation.  Due to ongoing chest pain, he underwent DES to mid RCA in September 2017.  He is no longer a candidate for flecainide due to underlying coronary artery disease.  Since then, he continued to have atypical chest pain.  Statin was discontinued due to presumed intolerance.  Myoview obtained in March 2022 showed no ischemia, diaphragmatic attenuation.  Heart monitor showed no arrhythmia.  He was last seen by Dr. Allyson Sabal in November 2023 at which time he was doing well.  He was still having some exertional low-grade chest discomfort last October, therefore was started on 15 mg of Imdur which he could not tolerate.  Patient presents today for follow-up.  He is well-known to me.  His mother passed away last year.  He has not taken his Repatha for about a year.  He was previously on Camera operator and only had to pay $5 co-pay for the Repatha.  For some reason he is reinsurance company removed Repatha from their formulary and changed to Praluent earlier this year.  Will try to get him approved for Praluent.  Since then, he has transition to [facility] in June of this year.  A prior authorization has already been done for Repatha under Medicare.  He still has not started on the Repatha.  Surprisingly, his blood work obtained in April 2024 showed LDL 52, well-controlled the total cholesterol, triglyceride and good cholesterol.  I discussed the case with our clinical pharmacist.  We recommend a repeat fasting lipid panel.  If LDL is greater than 55, we will ask him to start on the Repatha.  I called his pharmacy who reported he has co-pay under Medicare for his Repatha is $42.  He has been exercising regularly.  He has no chest pain with regular activity however mild chest discomfort with very strenuous exercise.  He is no longer taking Plavix.  He has last PCI was in 2017.  He is still on aspirin.  I recommended 74-month follow-up.  He will return tomorrow to get the fasting lipid panel since he already ate something this morning.  ROS: ***  Studies Reviewed: .        *** Risk Assessment/Calculations:   {Does this patient have ATRIAL FIBRILLATION?:[id number]} No BP recorded.  {Refresh Note OR Click here to enter BP  :1}***  Physical Exam:   VS:  There were no vitals taken for this visit.   Wt Readings from Last 3 Encounters:  09/21/22 210 lb (95.3 kg)  04/27/22 209 lb (94.8 kg)  04/06/22 211 lb 12.8 oz (96.1 kg)    GEN: Well nourished, well developed in no acute distress NECK: No JVD; No carotid bruits CARDIAC: ***RRR, no murmurs, rubs, gallops RESPIRATORY:  Clear to auscultation without rales, wheezing or rhonchi  ABDOMEN: Soft, non-tender, non-distended EXTREMITIES:  No edema; No deformity   ASSESSMENT AND PLAN: .   ***    {Are you ordering a CV Procedure (e.g. stress  test, cath, DCCV, TEE, etc)?   Press F2        :[id number]}  Dispo: ***  Signed, Jon Course, PA

## 2023-01-19 DIAGNOSIS — I480 Paroxysmal atrial fibrillation: Secondary | ICD-10-CM | POA: Diagnosis not present

## 2023-01-19 DIAGNOSIS — E785 Hyperlipidemia, unspecified: Secondary | ICD-10-CM | POA: Diagnosis not present

## 2023-01-20 NOTE — Progress Notes (Signed)
LDL uncontrolled, please call pharmacy to fill Repatha

## 2023-01-21 ENCOUNTER — Other Ambulatory Visit: Payer: Self-pay | Admitting: Cardiovascular Disease

## 2023-01-21 ENCOUNTER — Encounter: Payer: Self-pay | Admitting: Cardiovascular Disease

## 2023-01-21 ENCOUNTER — Other Ambulatory Visit: Payer: Self-pay

## 2023-01-21 MED ORDER — METOPROLOL SUCCINATE ER 25 MG PO TB24: ORAL_TABLET | ORAL | 3 refills | Status: DC

## 2023-03-07 ENCOUNTER — Other Ambulatory Visit (HOSPITAL_COMMUNITY): Payer: Self-pay

## 2023-04-05 ENCOUNTER — Encounter: Payer: Self-pay | Admitting: Internal Medicine

## 2023-06-05 ENCOUNTER — Encounter: Payer: Self-pay | Admitting: Cardiovascular Disease

## 2023-06-08 MED ORDER — AMLODIPINE BESYLATE 5 MG PO TABS: 5.0000 mg | ORAL_TABLET | Freq: Every day | ORAL | 0 refills | Status: DC

## 2023-07-06 ENCOUNTER — Encounter: Payer: Self-pay | Admitting: Cardiovascular Disease

## 2023-07-06 ENCOUNTER — Ambulatory Visit: Payer: Medicare (Managed Care) | Attending: Cardiovascular Disease | Admitting: Cardiovascular Disease

## 2023-07-06 VITALS — BP 124/74 | HR 81 | Ht 69.0 in | Wt 202.4 lb

## 2023-07-06 DIAGNOSIS — I10 Essential (primary) hypertension: Secondary | ICD-10-CM | POA: Diagnosis not present

## 2023-07-06 DIAGNOSIS — Z9861 Coronary angioplasty status: Secondary | ICD-10-CM

## 2023-07-06 DIAGNOSIS — E785 Hyperlipidemia, unspecified: Secondary | ICD-10-CM

## 2023-07-06 DIAGNOSIS — I2510 Atherosclerotic heart disease of native coronary artery without angina pectoris: Secondary | ICD-10-CM

## 2023-07-06 NOTE — Assessment & Plan Note (Signed)
 History of essential hypertension her blood pressure measured today at 124/74.  He is on amlodipine  and metoprolol .

## 2023-07-06 NOTE — Assessment & Plan Note (Signed)
 History of CAD the status post RCA intervention by myself via the femoral approach 03/10/2016 with PCI and stenting.  He did have an outpatient cath 02/05/2016 beat via the right radial approach which was complicated by ventricular fibrillation requiring CPR.  He said no further coronary artery disease symptoms since.  His last tress test performed 08/26/2020 was nonischemic.

## 2023-07-06 NOTE — Assessment & Plan Note (Signed)
 History of dyslipidemia on Repatha  which she has taken sporadically because of cost.  His LDL increased from 80 on 04/12/2022 up to 221 on 01/19/2023.  Now that he is taking his Repatha  on a more regular basis we will recheck a lipid liver profile.

## 2023-07-06 NOTE — Progress Notes (Signed)
 07/06/2023 [name]   [birth year]  [id number]  Primary Physician [name], Jon Bellman, MD Primary Cardiologist: Jon Leigh MD [name], [name]  HPI:  [name] is a 68 y.o.   single [place] male whose mother is a patient of mine. I last saw him in the office 04/27/2022. He has a history of treated hypertension, hyperlipidemia and family history for heart disease. He is complained of chest pain off and on for several years. Somewhat exertional. He did have a stress test done in January that was low risk. He's had a persistent cough over the last 6 months. Recent chest CT performed 01/08/16 showed no significant pulmonary abnormalities but there was a small pericardial effusion and calcium  in the LAD territory. He underwent outpatient cardiac catheterization on 02/05/16 B right radial approach revealing a normal left system with moderate hypodense mid dominant RCA stenosis. He developed ventricular fibrillation after the third coronary injection requiring CPR and defibrillation. Because of ongoing chest pain I performed PCI and drug-eluting stenting of his mid RCA via the right femoral approach 03/10/16. The procedure was uncomplicated. He continues to have atypical chest pain. His PCP discontinued the statin drug for presumed "statin intolerance".     Unfortunately, he lost his mother who is also a patient of mine.  He had a Myoview  showed diaphragmatic attenuation without ischemia performed 08/26/2020 and an event monitor showed no arrhythmias.  Since I saw him in the office a year and a half ago he is remained stable.  He denies chest pain or shortness of breath.  He works out in [name] 5 days a week.  He has been having difficulties getting his Repatha  because of cost but now he is taking more regularly.  Will recheck a lipid liver profile.     Current Meds  Medication Sig   amLODipine  (NORVASC ) 5 MG tablet Take 1 tablet (5 mg total) by mouth daily.    aspirin  EC 81 MG tablet Take 1 tablet (81 mg total) by mouth daily.   Blood Glucose Monitoring Suppl (ONETOUCH VERIO) w/Device KIT 1 Units by Does not apply route daily as needed.   Cholecalciferol (VITAMIN D3) 50 MCG (2000 UT) capsule Take 1 capsule (2,000 Units total) by mouth daily.   doxazosin  (CARDURA ) 8 MG tablet Take 1/2 (one-half) tablet by mouth twice daily   Evolocumab  (REPATHA  SURECLICK) 140 MG/ML SOAJ Inject 140 mg into the skin every 14 (fourteen) days.   finasteride  (PROSCAR ) 5 MG tablet Take 1 tablet (5 mg total) by mouth daily.   glucose blood (ONETOUCH VERIO) test strip Use as instructed   Lancets (ONETOUCH ULTRASOFT) lancets Use as instructed   metFORMIN  (GLUCOPHAGE ) 500 MG tablet Take 1 tablet (500 mg total) by mouth 2 (two) times daily with a meal.   metoprolol  succinate (TOPROL -XL) 25 MG 24 hr tablet Take 1/2 (one-half) tablet by mouth once daily   repaglinide  (PRANDIN ) 1 MG tablet TAKE 1 TABLET BY MOUTH THREE TIMES DAILY BEFORE MEAL(S)     Allergies  Allergen Reactions   Flomax  [Tamsulosin  Hcl] Other (See Comments)    Hard time breathing   Atorvastatin  Other (See Comments)    REACTION: leg cramps per patient with a large dose    Social History   Socioeconomic History   Marital status: Divorced    Spouse name: Not on file   Number of children: 1   Years of education: Not on file   Highest education level: Not on  file  Occupational History   Occupation: Engineer, materials  Tobacco Use   Smoking status: Never   Smokeless tobacco: Never  Vaping Use   Vaping status: Never Used  Substance and Sexual Activity   Alcohol use: Not Currently   Drug use: No   Sexual activity: Never    Birth control/protection: Abstinence  Other Topics Concern   Not on file  Social History Narrative   Coffee daily    Social Drivers of Corporate investment banker Strain: Not on file  Food Insecurity: Not on file  Transportation Needs: Not on file  Physical Activity: Not on  file  Stress: Not on file  Social Connections: Not on file  Intimate Partner Violence: Not on file     Review of Systems: General: negative for chills, fever, night sweats or weight changes.  Cardiovascular: negative for chest pain, dyspnea on exertion, edema, orthopnea, palpitations, paroxysmal nocturnal dyspnea or shortness of breath Dermatological: negative for rash Respiratory: negative for cough or wheezing Urologic: negative for hematuria Abdominal: negative for nausea, vomiting, diarrhea, bright red blood per rectum, melena, or hematemesis Neurologic: negative for visual changes, syncope, or dizziness All other systems reviewed and are otherwise negative except as noted above.    Blood pressure 124/74, pulse 81, height 5\' 9"  (1.753 m), weight 202 lb 6.4 oz (91.8 kg), SpO2 94%.  General appearance: alert and no distress Neck: no adenopathy, no carotid bruit, no JVD, supple, symmetrical, trachea midline, and thyroid  not enlarged, symmetric, no tenderness/mass/nodules Lungs: clear to auscultation bilaterally Heart: regular rate and rhythm, S1, S2 normal, no murmur, click, rub or gallop Extremities: extremities normal, atraumatic, no cyanosis or edema Pulses: 2+ and symmetric Skin: Skin color, texture, turgor normal. No rashes or lesions Neurologic: Grossly normal  EKG not performed today      ASSESSMENT AND PLAN:   Dyslipidemia History of dyslipidemia on Repatha  which she has taken sporadically because of cost.  His LDL increased from 80 on 04/12/2022 up to 221 on 01/19/2023.  Now that he is taking his Repatha  on a more regular basis we will recheck a lipid liver profile.  Essential hypertension History of essential hypertension her blood pressure measured today at 124/74.  He is on amlodipine  and metoprolol .  CAD S/P percutaneous coronary angioplasty History of CAD the status post RCA intervention by myself via the femoral approach 03/10/2016 with PCI and stenting.  He did  have an outpatient cath 02/05/2016 beat via the right radial approach which was complicated by ventricular fibrillation requiring CPR.  He said no further coronary artery disease symptoms since.  His last tress test performed 08/26/2020 was nonischemic.     Jon Leigh MD FACP,FACC,FAHA, [facility] 07/06/2023 4:24 PM

## 2023-07-06 NOTE — Patient Instructions (Addendum)
 Medication Instructions:  Your physician recommends that you continue on your current medications as directed. Please refer to the Current Medication list given to you today.    *If you need a refill on your cardiac medications before your next appointment, please call your pharmacy*   Lab Work: Your physician recommends that you return for lab work in 1 WEEK for:  - FASTING LIPID PANEL  - FASTING LIVER FUNCTION TEST     If you have labs (blood work) drawn today and your tests are completely normal, you will receive your results only by: MyChart Message (if you have MyChart) OR A paper copy in the mail If you have any lab test that is abnormal or we need to change your treatment, we will call you to review the results.   Testing/Procedures: NONE    Follow-Up: At [facility], you and your health needs are our priority.  As part of our continuing mission to provide you with exceptional heart care, we have created designated Provider Care Teams.  These Care Teams include your primary Cardiologist (physician) and Advanced Practice Providers (APPs -  Physician Assistants and Nurse Practitioners) who all work together to provide you with the care you need, when you need it.  We recommend signing up for the patient portal called "MyChart".  Sign up information is provided on this After Visit Summary.  MyChart is used to connect with patients for Virtual Visits (Telemedicine).  Patients are able to view lab/test results, encounter notes, upcoming appointments, etc.  Non-urgent messages can be sent to your provider as well.   To learn more about what you can do with MyChart, go to [email or web address].    Your next appointment:   1 year(s)  The format for your next appointment:   In Person  Provider:   Lauro Portal, MD    Other Instructions

## 2023-07-12 LAB — LIPID PANEL
Chol/HDL Ratio: 2 {ratio} (ref 0.0–5.0)
Cholesterol, Total: 118 mg/dL (ref 100–199)
HDL: 60 mg/dL (ref >39)
LDL Chol Calc (NIH): 37 mg/dL (ref 0–99)
Triglycerides: 118 mg/dL (ref 0–149)
VLDL Cholesterol Cal: 21 mg/dL (ref 5–40)

## 2023-07-12 LAB — HEPATIC FUNCTION PANEL
ALT: 22 [IU]/L (ref 0–44)
AST: 18 [IU]/L (ref 0–40)
Albumin: 4.3 g/dL (ref 3.9–4.9)
Alkaline Phosphatase: 65 [IU]/L (ref 44–121)
Bilirubin Total: 0.5 mg/dL (ref 0.0–1.2)
Bilirubin, Direct: 0.18 mg/dL (ref 0.00–0.40)
Total Protein: 6.6 g/dL (ref 6.0–8.5)

## 2023-08-07 ENCOUNTER — Other Ambulatory Visit: Payer: Self-pay | Admitting: Cardiovascular Disease

## 2023-08-08 ENCOUNTER — Other Ambulatory Visit: Payer: Self-pay | Admitting: Cardiovascular Disease

## 2023-08-08 MED ORDER — AMLODIPINE BESYLATE 5 MG PO TABS: 5.0000 mg | ORAL_TABLET | Freq: Every day | ORAL | 3 refills | Status: AC

## 2023-09-21 ENCOUNTER — Encounter: Payer: Self-pay | Admitting: Internal Medicine

## 2023-09-23 ENCOUNTER — Other Ambulatory Visit: Payer: Self-pay | Admitting: Internal Medicine

## 2023-09-30 ENCOUNTER — Encounter: Payer: Self-pay | Admitting: Internal Medicine

## 2023-10-02 ENCOUNTER — Other Ambulatory Visit: Payer: Self-pay | Admitting: Internal Medicine

## 2023-10-06 NOTE — Telephone Encounter (Signed)
 Refill completed 10/02/23 and patient will be following up with Dr.Plotnikov in May

## 2023-10-11 ENCOUNTER — Other Ambulatory Visit: Payer: Self-pay | Admitting: Internal Medicine

## 2023-10-17 ENCOUNTER — Ambulatory Visit (INDEPENDENT_AMBULATORY_CARE_PROVIDER_SITE_OTHER): Payer: Medicare (Managed Care) | Admitting: Internal Medicine

## 2023-10-17 ENCOUNTER — Encounter: Payer: Self-pay | Admitting: Internal Medicine

## 2023-10-17 VITALS — BP 112/68 | HR 67 | Temp 97.8°F | Ht 69.0 in | Wt 206.0 lb

## 2023-10-17 DIAGNOSIS — E1165 Type 2 diabetes mellitus with hyperglycemia: Secondary | ICD-10-CM

## 2023-10-17 DIAGNOSIS — I10 Essential (primary) hypertension: Secondary | ICD-10-CM | POA: Diagnosis not present

## 2023-10-17 DIAGNOSIS — Z0000 Encounter for general adult medical examination without abnormal findings: Secondary | ICD-10-CM | POA: Diagnosis not present

## 2023-10-17 DIAGNOSIS — E0865 Diabetes mellitus due to underlying condition with hyperglycemia: Secondary | ICD-10-CM | POA: Diagnosis not present

## 2023-10-17 DIAGNOSIS — E538 Deficiency of other specified B group vitamins: Secondary | ICD-10-CM | POA: Diagnosis not present

## 2023-10-17 DIAGNOSIS — Z125 Encounter for screening for malignant neoplasm of prostate: Secondary | ICD-10-CM

## 2023-10-17 LAB — URINALYSIS
Bilirubin Urine: NEGATIVE
Hgb urine dipstick: NEGATIVE
Ketones, ur: NEGATIVE
Leukocytes,Ua: NEGATIVE
Nitrite: NEGATIVE
Specific Gravity, Urine: 1.025 (ref 1.000–1.030)
Total Protein, Urine: NEGATIVE
Urine Glucose: NEGATIVE
Urobilinogen, UA: 0.2 (ref 0.0–1.0)
pH: 6 (ref 5.0–8.0)

## 2023-10-17 LAB — CBC WITH DIFFERENTIAL/PLATELET
Basophils Absolute: 0 10*3/uL (ref 0.0–0.1)
Basophils Relative: 0.7 % (ref 0.0–3.0)
Eosinophils Absolute: 0.2 10*3/uL (ref 0.0–0.7)
Eosinophils Relative: 2.7 % (ref 0.0–5.0)
HCT: 43 % (ref 39.0–52.0)
Hemoglobin: 14.9 g/dL (ref 13.0–17.0)
Lymphocytes Relative: 23.2 % (ref 12.0–46.0)
Lymphs Abs: 1.4 10*3/uL (ref 0.7–4.0)
MCHC: 34.6 g/dL (ref 30.0–36.0)
MCV: 90.5 fl (ref 78.0–100.0)
Monocytes Absolute: 0.6 10*3/uL (ref 0.1–1.0)
Monocytes Relative: 9.8 % (ref 3.0–12.0)
Neutro Abs: 3.9 10*3/uL (ref 1.4–7.7)
Neutrophils Relative %: 63.6 % (ref 43.0–77.0)
Platelets: 222 10*3/uL (ref 150.0–400.0)
RBC: 4.75 Mil/uL (ref 4.22–5.81)
RDW: 12.8 % (ref 11.5–15.5)
WBC: 6.1 10*3/uL (ref 4.0–10.5)

## 2023-10-17 LAB — COMPREHENSIVE METABOLIC PANEL WITH GFR
ALT: 24 U/L (ref 0–53)
AST: 18 U/L (ref 0–37)
Albumin: 4.3 g/dL (ref 3.5–5.2)
Alkaline Phosphatase: 57 U/L (ref 39–117)
BUN: 14 mg/dL (ref 6–23)
CO2: 27 meq/L (ref 19–32)
Calcium: 9.1 mg/dL (ref 8.4–10.5)
Chloride: 103 meq/L (ref 96–112)
Creatinine, Ser: 1.05 mg/dL (ref 0.40–1.50)
GFR: 74.31 mL/min (ref >60.00)
Glucose, Bld: 138 mg/dL — ABNORMAL HIGH (ref 70–99)
Potassium: 4.9 meq/L (ref 3.5–5.1)
Sodium: 138 meq/L (ref 135–145)
Total Bilirubin: 0.5 mg/dL (ref 0.2–1.2)
Total Protein: 6.8 g/dL (ref 6.0–8.3)

## 2023-10-17 LAB — MICROALBUMIN / CREATININE URINE RATIO
Creatinine,U: 106.2 mg/dL
Microalb Creat Ratio: 20.6 mg/g (ref 0.0–30.0)
Microalb, Ur: 2.2 mg/dL — ABNORMAL HIGH (ref 0.0–1.9)

## 2023-10-17 LAB — LIPID PANEL
Cholesterol: 119 mg/dL (ref 0–200)
HDL: 56 mg/dL (ref >39.00)
LDL Cholesterol: 40 mg/dL (ref 0–99)
NonHDL: 63.03
Total CHOL/HDL Ratio: 2
Triglycerides: 113 mg/dL (ref 0.0–149.0)
VLDL: 22.6 mg/dL (ref 0.0–40.0)

## 2023-10-17 LAB — PSA: PSA: 0.05 ng/mL — ABNORMAL LOW (ref 0.10–4.00)

## 2023-10-17 LAB — TSH: TSH: 1.64 u[IU]/mL (ref 0.35–5.50)

## 2023-10-17 LAB — HEMOGLOBIN A1C: Hgb A1c MFr Bld: 6 % (ref 4.6–6.5)

## 2023-10-17 MED ORDER — FINASTERIDE 5 MG PO TABS: 5.0000 mg | ORAL_TABLET | Freq: Every day | ORAL | 3 refills | Status: AC

## 2023-10-17 MED ORDER — DOXAZOSIN MESYLATE 8 MG PO TABS: 8.0000 mg | ORAL_TABLET | Freq: Every day | ORAL | 3 refills | Status: AC

## 2023-10-17 MED ORDER — METFORMIN HCL 500 MG PO TABS: 500.0000 mg | ORAL_TABLET | Freq: Two times a day (BID) | ORAL | 3 refills | Status: AC

## 2023-10-17 NOTE — Assessment & Plan Note (Signed)
On B complex 

## 2023-10-17 NOTE — Progress Notes (Signed)
 Subjective:  Patient ID: [name], male    DOB: [birth year]  Age: 68 y.o. MRN: [id number]  CC: Medication Management (Follow up for medications. Has been over a year since being seen by Dr.Rachelle Edwards. Patient notes intermittent neck pain (right side) as well as bilateral ear fullness. Labs if possible)   HPI GUY HENRICH presents for HTN, CAD, DM C/o ear wax Well exam     Outpatient Medications Prior to Visit  Medication Sig Dispense Refill   amLODipine  (NORVASC ) 5 MG tablet Take 1 tablet (5 mg total) by mouth daily. 90 tablet 3   aspirin  EC 81 MG tablet Take 1 tablet (81 mg total) by mouth daily. 30 tablet 0   Blood Glucose Monitoring Suppl (ONETOUCH VERIO) w/Device KIT 1 Units by Does not apply route daily as needed. 1 kit 1   Cholecalciferol (VITAMIN D3) 50 MCG (2000 UT) capsule Take 1 capsule (2,000 Units total) by mouth daily. 100 capsule 3   Evolocumab  (REPATHA  SURECLICK) 140 MG/ML SOAJ Inject 140 mg into the skin every 14 (fourteen) days. 2 mL 11   glucose blood (ONETOUCH VERIO) test strip Use as instructed 50 each 11   Lancets (ONETOUCH ULTRASOFT) lancets Use as instructed 100 each 12   metoprolol  succinate (TOPROL -XL) 25 MG 24 hr tablet Take 1/2 (one-half) tablet by mouth once daily 90 tablet 3   repaglinide  (PRANDIN ) 1 MG tablet TAKE 1 TABLET BY MOUTH THREE TIMES DAILY BEFORE MEAL(S) 90 tablet 11   doxazosin  (CARDURA ) 8 MG tablet Take 1/2 (one-half) tablet by mouth twice daily 90 tablet 0   finasteride  (PROSCAR ) 5 MG tablet Take 1 tablet by mouth once daily 90 tablet 0   metFORMIN  (GLUCOPHAGE ) 500 MG tablet TAKE 1 TABLET BY MOUTH TWICE DAILY WITH A MEAL 60 tablet 0   clopidogrel  (PLAVIX ) 75 MG tablet Take by mouth. (Patient not taking: Reported on 10/17/2023)     nitroGLYCERIN  (NITROSTAT ) 0.4 MG SL tablet Place 1 tablet (0.4 mg total) under the tongue every 5 (five) minutes as needed for chest pain. (Patient not taking: Reported on 07/06/2023) 25 tablet 4    No facility-administered medications prior to visit.    ROS: Review of Systems  Constitutional:  Negative for appetite change, fatigue and unexpected weight change.  HENT:  Negative for congestion, nosebleeds, sneezing, sore throat and trouble swallowing.   Eyes:  Negative for itching and visual disturbance.  Respiratory:  Negative for cough.   Cardiovascular:  Negative for chest pain, palpitations and leg swelling.  Gastrointestinal:  Negative for abdominal distention, blood in stool, diarrhea and nausea.  Genitourinary:  Negative for frequency and hematuria.  Musculoskeletal:  Positive for neck pain. Negative for back pain, gait problem and joint swelling.  Skin:  Negative for rash.  Neurological:  Negative for dizziness, tremors, speech difficulty and weakness.  Psychiatric/Behavioral:  Negative for agitation, dysphoric mood and sleep disturbance. The patient is not nervous/anxious.     Objective:  BP 112/68   Pulse 67   Temp 97.8 F (36.6 C)   Ht 5\' 9"  (1.753 m)   Wt 206 lb (93.4 kg)   SpO2 97%   BMI 30.42 kg/m   BP Readings from Last 3 Encounters:  10/17/23 112/68  07/06/23 124/74  01/18/23 110/78    Wt Readings from Last 3 Encounters:  10/17/23 206 lb (93.4 kg)  07/06/23 202 lb 6.4 oz (91.8 kg)  01/18/23 207 lb 6.4 oz (94.1 kg)    Physical Exam Constitutional:  General: He is not in acute distress.    Appearance: Normal appearance. He is well-developed.     Comments: NAD  Eyes:     Conjunctiva/sclera: Conjunctivae normal.     Pupils: Pupils are equal, round, and reactive to light.  Neck:     Thyroid : No thyromegaly.     Vascular: No JVD.  Cardiovascular:     Rate and Rhythm: Normal rate and regular rhythm.     Heart sounds: Normal heart sounds. No murmur heard.    No friction rub. No gallop.  Pulmonary:     Effort: Pulmonary effort is normal. No respiratory distress.     Breath sounds: Normal breath sounds. No wheezing or rales.  Chest:      Chest wall: No tenderness.  Abdominal:     General: Bowel sounds are normal. There is no distension.     Palpations: Abdomen is soft. There is no mass.     Tenderness: There is no abdominal tenderness. There is no guarding or rebound.  Musculoskeletal:        General: No tenderness. Normal range of motion.     Cervical back: Normal range of motion.  Lymphadenopathy:     Cervical: No cervical adenopathy.  Skin:    General: Skin is warm and dry.     Findings: No rash.  Neurological:     Mental Status: He is alert and oriented to person, place, and time.     Cranial Nerves: No cranial nerve deficit.     Motor: No abnormal muscle tone.     Coordination: Coordination normal.     Gait: Gait normal.     Deep Tendon Reflexes: Reflexes are normal and symmetric.  Psychiatric:        Behavior: Behavior normal.        Thought Content: Thought content normal.        Judgment: Judgment normal.     Lab Results  Component Value Date   WBC 6.5 09/22/2022   HGB 14.9 09/22/2022   HCT 42.7 09/22/2022   PLT 206.0 09/22/2022   GLUCOSE 143 (H) 09/22/2022   CHOL 118 07/11/2023   TRIG 118 07/11/2023   HDL 60 07/11/2023   LDLDIRECT 110.0 04/15/2017   LDLCALC 37 07/11/2023   ALT 22 07/11/2023   AST 18 07/11/2023   NA 137 09/22/2022   K 4.3 09/22/2022   CL 105 09/22/2022   CREATININE 1.01 09/22/2022   BUN 17 09/22/2022   CO2 25 09/22/2022   TSH 3.03 09/22/2022   PSA 0.05 (L) 09/22/2022   INR 1.0 03/10/2016   HGBA1C 6.6 (H) 09/22/2022    Myocardial Perfusion Imaging Result Date: 08/26/2020  Nuclear stress EF: 53%.  The left ventricular ejection fraction is mildly decreased (45-54%).  There was no ST segment deviation noted during stress.  Defect 1: There is a small defect of mild severity present in the basal anterior and mid anterior location.  Defect 2: There is a large defect of severe severity present in the basal inferior and mid inferior location.  Findings consistent with  ischemia.  This is a low risk study.  Abnormal, low risk stress nuclear study with prominent diaphragmatic attenuation/inferior thinning and very mild ischemia in the mid anterior wall.  Gated ejection fraction low normal at 53%.    Assessment & Plan:   Problem List Items Addressed This Visit     Essential hypertension   Monitor BP      Relevant Medications   doxazosin  (CARDURA )  8 MG tablet   Other Relevant Orders   Microalbumin / creatinine urine ratio   Hemoglobin A1c   B12 deficiency   On B complex      Well adult exam - Primary   We discussed age appropriate health related issues, including available/recomended screening tests and vaccinations. We discussed a need for adhering to healthy diet and exercise. Labs were ordered to be later reviewed . All questions were answered. Last colon 2022, due in 2032        Relevant Orders   TSH   Urinalysis   CBC with Differential/Platelet   Lipid panel   PSA   Comprehensive metabolic panel with GFR   Microalbumin / creatinine urine ratio   Hemoglobin A1c   Diabetes mellitus due to underlying condition, uncontrolled, with hyperglycemia (HCC)   Relevant Medications   metFORMIN  (GLUCOPHAGE ) 500 MG tablet   Other Relevant Orders   Microalbumin / creatinine urine ratio   Hemoglobin A1c      Meds ordered this encounter  Medications   doxazosin  (CARDURA ) 8 MG tablet    Sig: Take 1 tablet (8 mg total) by mouth daily. Take 1/2 (one-half) tablet by mouth twice daily    Dispense:  90 tablet    Refill:  3    Schedule office visit   finasteride  (PROSCAR ) 5 MG tablet    Sig: Take 1 tablet (5 mg total) by mouth daily.    Dispense:  90 tablet    Refill:  3   metFORMIN  (GLUCOPHAGE ) 500 MG tablet    Sig: Take 1 tablet (500 mg total) by mouth 2 (two) times daily with a meal.    Dispense:  180 tablet    Refill:  3      Follow-up: Return in about 6 months (around 04/17/2024) for a follow-up visit.  Anitra Barn, MD

## 2023-10-17 NOTE — Assessment & Plan Note (Addendum)
 We discussed age appropriate health related issues, including available/recomended screening tests and vaccinations. We discussed a need for adhering to healthy diet and exercise. Labs were ordered to be later reviewed . All questions were answered. Last colon 2022, due in 2032

## 2023-10-17 NOTE — Assessment & Plan Note (Signed)
 Monitor BP

## 2023-10-26 ENCOUNTER — Ambulatory Visit: Payer: Medicare (Managed Care) | Admitting: Internal Medicine

## 2023-12-12 ENCOUNTER — Ambulatory Visit: Payer: Medicare (Managed Care)

## 2023-12-12 VITALS — Ht 69.0 in | Wt 206.0 lb

## 2023-12-12 DIAGNOSIS — E0865 Diabetes mellitus due to underlying condition with hyperglycemia: Secondary | ICD-10-CM

## 2023-12-12 DIAGNOSIS — Z0000 Encounter for general adult medical examination without abnormal findings: Secondary | ICD-10-CM

## 2023-12-12 NOTE — Patient Instructions (Signed)
 Mr. Kicklighter , Thank you for taking time out of your busy schedule to complete your Annual Wellness Visit with me. I enjoyed our conversation and look forward to speaking with you again next year. I, as well as your care team,  appreciate your ongoing commitment to your health goals. Please review the following plan we discussed and let me know if I can assist you in the future. Your Game plan/ To Do List     Follow up Visits: Next Medicare AWV with our clinical staff: 6/24/*2026.   Have you seen your provider in the last 6 months (3 months if uncontrolled diabetes)? Yes Next Office Visit with your provider: Patient's last office visit was 10/17/23.  Clinician Recommendations:  Aim for 30 minutes of exercise or brisk walking, 6-8 glasses of water, and 5 servings of fruits and vegetables each day. You are due for a pneumonia vaccine.  You are also due for a foot exam and can get this done during your next office visit with Dr. Georga.  A referral has been placed for you to have an eye exam.  Please listen out for a call form a eye doctors office.  Have a Happy Birthday!      This is a list of the screening recommended for you and due dates:  Health Maintenance  Topic Date Due   Complete foot exam   Never done   Eye exam for diabetics  Never done   Pneumococcal Vaccine for age over 27 (2 of 2 - PPSV23, PCV20, or PCV21) 06/17/2017   COVID-19 Vaccine (4 - 2024-25 season) 02/20/2023   Flu Shot  01/20/2024   Hemoglobin A1C  04/17/2024   Yearly kidney function blood test for diabetes  10/16/2024   Yearly kidney health urinalysis for diabetes  10/16/2024   Medicare Annual Wellness Visit  12/11/2024   DTaP/Tdap/Td vaccine (4 - Td or Tdap) 02/27/2030   Colon Cancer Screening  07/16/2030   Hepatitis C Screening  Completed   Zoster (Shingles) Vaccine  Completed   HPV Vaccine  Aged Out   Meningitis B Vaccine  Aged Out    Advanced directives: (Copy Requested) Please bring a copy of your health  care power of attorney and living will to the office to be added to your chart at your convenience. You can mail to [facility] [address] [place], [place] [address] or email to [email or web address] Advance Care Planning is important because it:  [x]  Makes sure you receive the medical care that is consistent with your values, goals, and preferences  [x]  It provides guidance to your family and loved ones and reduces their decisional burden about whether or not they are making the right decisions based on your wishes.  Follow the link provided in your after visit summary or read over the paperwork we have mailed to you to help you started getting your Advance Directives in place. If you need assistance in completing these, please reach out to us  so that we can help you!  See attachments for Preventive Care and Fall Prevention Tips.

## 2023-12-12 NOTE — Progress Notes (Cosign Needed)
 Subjective:   [name] is a 68 y.o. who presents for a Medicare Wellness preventive visit.  As a reminder, Annual Wellness Visits don't include a physical exam, and some assessments may be limited, especially if this visit is performed virtually. We may recommend an in-person follow-up visit with your provider if needed.  Visit Complete: Virtual I connected with  [name] on 12/12/23 by a audio enabled telemedicine application and verified that I am speaking with the correct person using two identifiers.  Patient Location: Home  Provider Location: Home Office  I discussed the limitations of evaluation and management by telemedicine. The patient expressed understanding and agreed to proceed.  Vital Signs: Because this visit was a virtual/telehealth visit, some criteria may be missing or patient reported. Any vitals not documented were not able to be obtained and vitals that have been documented are patient reported.  VideoDeclined- This patient declined Librarian, academic. Therefore the visit was completed with audio only.  Persons Participating in Visit: Patient assisted by interpreter.  AWV Questionnaire: No: Patient Medicare AWV questionnaire was not completed prior to this visit.  Cardiac Risk Factors include: advanced age (>41men, >54 women);male gender;hypertension;diabetes mellitus;Other (see comment), Risk factor comments: PAF     Objective:    Today's Vitals   12/12/23 0934  Weight: 206 lb (93.4 kg)  Height: 5' 9 (1.753 m)   Body mass index is 30.42 kg/m.     12/12/2023    9:42 AM 03/18/2016   10:01 AM 02/05/2016    6:33 AM 12/30/2014   12:07 AM  Advanced Directives  Does Patient Have a Medical Advance Directive? Yes No  No  No   Type of Estate agent of [place];Living will     Copy of Healthcare Power of Attorney in Chart? No - copy requested     Would patient like information on creating  a medical advance directive?  No - patient declined information  No - patient declined information  No - patient declined information      Data saved with a previous flowsheet row definition    Current Medications (verified) Outpatient Encounter Medications as of 12/12/2023  Medication Sig   amLODipine  (NORVASC ) 5 MG tablet Take 1 tablet (5 mg total) by mouth daily.   aspirin  EC 81 MG tablet Take 1 tablet (81 mg total) by mouth daily.   Blood Glucose Monitoring Suppl (ONETOUCH VERIO) w/Device KIT 1 Units by Does not apply route daily as needed.   Cholecalciferol (VITAMIN D3) 50 MCG (2000 UT) capsule Take 1 capsule (2,000 Units total) by mouth daily.   clopidogrel  (PLAVIX ) 75 MG tablet Take by mouth.   doxazosin  (CARDURA ) 8 MG tablet Take 1 tablet (8 mg total) by mouth daily. Take 1/2 (one-half) tablet by mouth twice daily   Evolocumab  (REPATHA  SURECLICK) 140 MG/ML SOAJ Inject 140 mg into the skin every 14 (fourteen) days.   finasteride  (PROSCAR ) 5 MG tablet Take 1 tablet (5 mg total) by mouth daily.   glucose blood (ONETOUCH VERIO) test strip Use as instructed   Lancets (ONETOUCH ULTRASOFT) lancets Use as instructed   metFORMIN  (GLUCOPHAGE ) 500 MG tablet Take 1 tablet (500 mg total) by mouth 2 (two) times daily with a meal.   metoprolol  succinate (TOPROL -XL) 25 MG 24 hr tablet Take 1/2 (one-half) tablet by mouth once daily   nitroGLYCERIN  (NITROSTAT ) 0.4 MG SL tablet Place 1 tablet (0.4 mg total) under the tongue every 5 (five) minutes as needed  for chest pain.   repaglinide  (PRANDIN ) 1 MG tablet TAKE 1 TABLET BY MOUTH THREE TIMES DAILY BEFORE MEAL(S)   No facility-administered encounter medications on file as of 12/12/2023.    Allergies (verified) Flomax  [tamsulosin  hcl] and Atorvastatin    History: Past Medical History:  Diagnosis Date   Allergy     Atrial fibrillation (HCC)    CAD (coronary artery disease)    a.cath 01/2016: 60% stenosis RCA. Went into VF during procedure requiring  CPR and defibrillation. No intervention performed at that time b.cath 02/2016: 3 mm x 15 mm Xience DES to prox-RCA    Coronary artery disease    GERD (gastroesophageal reflux disease)    Headache(784.0)    Hyperlipidemia    Hypertension    Kidney stones    passed it (03/18/2016)   URI (upper respiratory infection)    Past Surgical History:  Procedure Laterality Date   CARDIAC CATHETERIZATION N/A 02/05/2016   Procedure: Left Heart Cath and Coronary Angiography;  Surgeon: Dorn JINNY Lesches, MD;  [facility] INV[facility] CV LAB;  Service: Cardiovascular;  Laterality: N/A;   CARDIAC CATHETERIZATION N/A 03/18/2016   Procedure: Coronary Stent Intervention;  Surgeon: Dorn JINNY Lesches, MD;  Location: [facility] INVASIVE CV LAB;  Service: Cardiovascular;  Laterality: N/A;  RCA   COLONOSCOPY     CORONARY ANGIOPLASTY     INGUINAL HERNIA REPAIR Bilateral    2005   TONSILLECTOMY     Family History  Problem Relation Age of Onset   Coronary artery disease Father    Diabetes Father    Heart disease Father    Liver disease Mother    Kidney disease Mother    Heart disease Mother    Coronary artery disease Mother    Heart attack Mother    Hypertension Mother    Heart attack Maternal Grandfather    Colon cancer Neg Hx    Stroke Neg Hx    Esophageal cancer Neg Hx    Stomach cancer Neg Hx    Rectal cancer Neg Hx    Social History   Socioeconomic History   Marital status: Divorced    Spouse name: Not on file   Number of children: 1   Years of education: Not on file   Highest education level: Not on file  Occupational History   Occupation: Engineer, materials  Tobacco Use   Smoking status: Never   Smokeless tobacco: Never  Vaping Use   Vaping status: Never Used  Substance and Sexual Activity   Alcohol use: Not Currently   Drug use: No   Sexual activity: Never    Birth control/protection: Abstinence  Other Topics Concern   Not on file  Social History Narrative   Coffee daily    Lives with  son/2025   Social Drivers of Health   Financial Resource Strain: Not on file  Food Insecurity: Not on file  Transportation Needs: No Transportation Needs (12/12/2023)   PRAPARE - Transportation    Lack of Transportation (Medical): No    Lack of Transportation (Non-Medical): No  Physical Activity: Insufficiently Active (12/12/2023)   Exercise Vital Sign    Days of Exercise per Week: 7 days    Minutes of Exercise per Session: 20 min  Stress: No Stress Concern Present (12/12/2023)   [employer] of Occupational Health - Occupational Stress Questionnaire    Feeling of Stress: Not at all  Social Connections: Moderately Integrated (12/12/2023)   Social Connection and Isolation Panel    Frequency of Communication with Friends  and Family: More than three times a week    Frequency of Social Gatherings with Friends and Family: Three times a week    Attends Religious Services: More than 4 times per year    Active Member of Clubs or Organizations: Yes    Attends Banker Meetings: Never    Marital Status: Divorced    Tobacco Counseling Counseling given: Not Answered    Clinical Intake:  Pre-visit preparation completed: Yes  Pain : No/denies pain     BMI - recorded: 30.42 Nutritional Status: BMI > 30  Obese Nutritional Risks: None Diabetes: No  Lab Results  Component Value Date   HGBA1C 6.0 10/17/2023   HGBA1C 6.6 (H) 09/22/2022   HGBA1C 12.8 (H) 07/21/2021     How often do you need to have someone help you when you read instructions, pamphlets, or other written materials from your doctor or pharmacy?: 5 - Always  Interpreter Needed?: Yes Interpreter Name: Burtis Interpreter ID: [id number] Patient Declined Interpreter : No  Information entered by :: Cidney Kirkwood, RMA   Activities of Daily Living     12/12/2023    9:38 AM  In your present state of health, do you have any difficulty performing the following activities:  Hearing? 0  Vision? 0  Difficulty  concentrating or making decisions? 0  Walking or climbing stairs? 0  Dressing or bathing? 0  Doing errands, shopping? 0  Preparing Food and eating ? N  Using the Toilet? N  In the past six months, have you accidently leaked urine? N  Do you have problems with loss of bowel control? N  Managing your Medications? N  Managing your Finances? N  Housekeeping or managing your Housekeeping? N    Patient Care Team: Plotnikov, Karlynn GAILS, MD as PCP - [address] Dorn PARAS, MD as PCP - Cardiology (Cardiology) Waddell Danelle ORN, MD as Consulting Physician (Cardiology)  I have updated your Care Teams any recent Medical Services you may have received from other providers in the past year.     Assessment:   This is a routine wellness examination for [name].  Hearing/Vision screen Hearing Screening - Comments:: Denies hearing difficulties   Vision Screening - Comments:: Denies vision issues.    Goals Addressed   None    Depression Screen     12/12/2023    9:45 AM 10/17/2023    9:38 AM 09/21/2022   11:23 AM 07/21/2021    2:41 PM 02/28/2020    2:03 PM 04/22/2017    2:43 PM 03/10/2015    5:16 PM  PHQ 2/9 Scores  PHQ - 2 Score 0 0 0 0 0 0 0  PHQ- 9 Score 0   0       Fall Risk     12/12/2023    9:41 AM 10/17/2023    9:38 AM 09/21/2022   11:23 AM 07/21/2021    2:40 PM  Fall Risk   Falls in the past year? 0 0 0 0  Number falls in past yr: 0 0 0 0  Injury with Fall? 0 0 0 0  Risk for fall due to :  No Fall Risks No Fall Risks No Fall Risks  Follow up Falls evaluation completed;Falls prevention discussed Falls evaluation completed Falls evaluation completed     MEDICARE RISK AT HOME:  Medicare Risk at Home Any stairs in or around the home?: Yes If so, are there any without handrails?: No Home free of loose throw rugs in walkways,  pet beds, electrical cords, etc?: Yes Adequate lighting in your home to reduce risk of falls?: Yes Life alert?: No Use of a cane, walker or w/c?: No Grab  bars in the bathroom?: No Shower chair or bench in shower?: No Elevated toilet seat or a handicapped toilet?: No  TIMED UP AND GO:  Was the test performed?  No  Cognitive Function: Declined/Normal: No cognitive concerns noted by patient or family. Patient alert, oriented, able to answer questions appropriately and recall recent events. No signs of memory loss or confusion.        Immunizations Immunization History  Administered Date(s) Administered   Influenza, Quadrivalent, Recombinant, Inj, Pf 04/01/2020   Influenza, Seasonal, Injecte, Preservative Fre 03/11/2014, 03/20/2022   Influenza,inj,Quad PF,6+ Mos 03/23/2016, 05/04/2016, 03/04/2017, 02/28/2018, 03/08/2019   Influenza,inj,Quad PF,6-35 Mos 04/17/2021   Influenza-Unspecified 03/25/2015, 03/23/2016, 05/04/2016, 03/04/2017, 02/28/2018   PFIZER(Purple Top)SARS-COV-2 Vaccination 08/13/2019, 09/03/2019, 04/29/2020   PPD Test 02/04/2014, 03/10/2015   Pneumococcal Conjugate-13 04/22/2017   Td 08/25/2009   Td (Adult),5 Lf Tetanus Toxid, Preservative Free 08/25/2009   Tdap 02/28/2020   Zoster Recombinant(Shingrix ) 02/22/2018, 09/04/2018   Zoster, Unspecified 02/22/2018    Screening Tests Health Maintenance  Topic Date Due   FOOT EXAM  Never done   OPHTHALMOLOGY EXAM  Never done   Pneumococcal Vaccine: 50+ Years (2 of 2 - PPSV23, PCV20, or PCV21) 06/17/2017   COVID-19 Vaccine (4 - 2024-25 season) 02/20/2023   INFLUENZA VACCINE  01/20/2024   HEMOGLOBIN A1C  04/17/2024   Diabetic kidney evaluation - eGFR measurement  10/16/2024   Diabetic kidney evaluation - Urine ACR  10/16/2024   Medicare Annual Wellness (AWV)  12/11/2024   DTaP/Tdap/Td (4 - Td or Tdap) 02/27/2030   Colonoscopy  07/16/2030   Hepatitis C Screening  Completed   Zoster Vaccines- Shingrix   Completed   HPV VACCINES  Aged Out   Meningococcal B Vaccine  Aged Out    Health Maintenance  Health Maintenance Due  Topic Date Due   FOOT EXAM  Never done    OPHTHALMOLOGY EXAM  Never done   Pneumococcal Vaccine: 50+ Years (2 of 2 - PPSV23, PCV20, or PCV21) 06/17/2017   COVID-19 Vaccine (4 - 2024-25 season) 02/20/2023   Health Maintenance Items Addressed: Referral sent to Optometry/Ophthalmology, Diabetic Foot Exam recommended, See Nurse Notes at the end of this note  Additional Screening:  Vision Screening: Recommended annual ophthalmology exams for early detection of glaucoma and other disorders of the eye. Would you like a referral to an eye doctor? Yes    Dental Screening: Recommended annual dental exams for proper oral hygiene  Community Resource Referral / Chronic Care Management: CRR required this visit?  No   CCM required this visit?  No   Plan:    I have personally reviewed and noted the following in the patient's chart:   Medical and social history Use of alcohol, tobacco or illicit drugs  Current medications and supplements including opioid prescriptions. Patient is not currently taking opioid prescriptions. Functional ability and status Nutritional status Physical activity Advanced directives List of other physicians Hospitalizations, surgeries, and ER visits in previous 12 months Vitals Screenings to include cognitive, depression, and falls Referrals and appointments  In addition, I have reviewed and discussed with patient certain preventive protocols, quality metrics, and best practice recommendations. A written personalized care plan for preventive services as well as general preventive health recommendations were provided to patient.   Trinna CROME [name], CMA   12/12/2023   After Visit  Summary: (MyChart) Due to this being a telephonic visit, the after visit summary with patients personalized plan was offered to patient via MyChart   Notes: Patient is due for a 2nd pneumonia vaccine.  He is due for a foot exam and a diabetic eye exam as well.  I have placed a referral for and ophthalmologist.  He had no other  concerns to address today.   Medical screening examination/treatment/procedure(s) were performed by non-physician practitioner and as supervising physician I was immediately available for consultation/collaboration.  I agree with above. Karlynn Noel, MD

## 2023-12-29 DIAGNOSIS — E119 Type 2 diabetes mellitus without complications: Secondary | ICD-10-CM | POA: Diagnosis not present

## 2024-01-28 ENCOUNTER — Other Ambulatory Visit: Payer: Self-pay | Admitting: Cardiovascular Disease

## 2024-01-30 ENCOUNTER — Other Ambulatory Visit (HOSPITAL_COMMUNITY): Payer: Self-pay

## 2024-01-30 ENCOUNTER — Telehealth: Payer: Self-pay | Admitting: Pharmacy Technician

## 2024-01-30 NOTE — Telephone Encounter (Signed)
 Pharmacy Patient Advocate Encounter   Received notification from CoverMyMeds that prior authorization for Repatha  is required/requested.  Insurance verification completed.   The patient is insured through [employer] .   Per test claim: PA required; PA submitted to above mentioned insurance via CoverMyMeds Key/confirmation #/EOC [id number] Status is pending

## 2024-01-30 NOTE — Telephone Encounter (Signed)
 Pharmacy Patient Advocate Encounter  Received notification from [employer] that Prior Authorization for repatha  has been APPROVED from 12/31/23 to 01/29/25. Ran test claim, Copay is $47.00- one month. This test claim was processed through [facility]- copay amounts may vary at other pharmacies due to pharmacy/plan contracts, or as the patient moves through the different stages of their insurance plan.   PA #/Case ID/Reference #: [id number]

## 2024-02-29 ENCOUNTER — Other Ambulatory Visit: Payer: Self-pay | Admitting: Cardiovascular Disease

## 2024-02-29 DIAGNOSIS — E785 Hyperlipidemia, unspecified: Secondary | ICD-10-CM

## 2024-02-29 DIAGNOSIS — I2510 Atherosclerotic heart disease of native coronary artery without angina pectoris: Secondary | ICD-10-CM

## 2024-04-24 ENCOUNTER — Other Ambulatory Visit: Payer: Self-pay | Admitting: Cardiovascular Disease

## 2024-05-01 ENCOUNTER — Ambulatory Visit: Payer: Medicare (Managed Care)

## 2024-05-18 ENCOUNTER — Other Ambulatory Visit: Payer: Self-pay | Admitting: Cardiovascular Disease

## 2024-05-18 DIAGNOSIS — E785 Hyperlipidemia, unspecified: Secondary | ICD-10-CM

## 2024-05-18 DIAGNOSIS — I2510 Atherosclerotic heart disease of native coronary artery without angina pectoris: Secondary | ICD-10-CM

## 2024-06-27 ENCOUNTER — Encounter: Payer: Self-pay | Admitting: Cardiovascular Disease

## 2024-07-18 ENCOUNTER — Other Ambulatory Visit: Payer: Self-pay | Admitting: Cardiovascular Disease

## 2024-07-19 ENCOUNTER — Other Ambulatory Visit: Payer: Self-pay | Admitting: Cardiovascular Disease

## 2024-07-23 ENCOUNTER — Encounter: Payer: Self-pay | Admitting: Cardiovascular Disease

## 2024-08-01 ENCOUNTER — Ambulatory Visit: Payer: Medicare (Managed Care) | Admitting: Cardiovascular Disease

## 2024-12-12 ENCOUNTER — Ambulatory Visit: Payer: Medicare (Managed Care)
